# Patient Record
Sex: Male | Born: 1940
Health system: Southern US, Community
[De-identification: ages and names within clinical notes are randomized; demographics above are authoritative.]

## PROBLEM LIST (undated history)

## (undated) DIAGNOSIS — Z87442 Personal history of urinary calculi: Secondary | ICD-10-CM

## (undated) DIAGNOSIS — E785 Hyperlipidemia, unspecified: Secondary | ICD-10-CM

## (undated) DIAGNOSIS — D649 Anemia, unspecified: Secondary | ICD-10-CM

## (undated) DIAGNOSIS — I10 Essential (primary) hypertension: Secondary | ICD-10-CM

## (undated) DIAGNOSIS — E119 Type 2 diabetes mellitus without complications: Secondary | ICD-10-CM

## (undated) DIAGNOSIS — I4819 Other persistent atrial fibrillation: Secondary | ICD-10-CM

## (undated) DIAGNOSIS — H269 Unspecified cataract: Secondary | ICD-10-CM

## (undated) DIAGNOSIS — M1990 Unspecified osteoarthritis, unspecified site: Secondary | ICD-10-CM

## (undated) DIAGNOSIS — Z5189 Encounter for other specified aftercare: Secondary | ICD-10-CM

## (undated) DIAGNOSIS — I447 Left bundle-branch block, unspecified: Secondary | ICD-10-CM

## (undated) HISTORY — DX: Anemia, unspecified: D64.9

## (undated) HISTORY — DX: Hyperlipidemia, unspecified: E78.5

## (undated) HISTORY — DX: Unspecified cataract: H26.9

## (undated) HISTORY — PX: LITHOTRIPSY: SUR834

## (undated) HISTORY — DX: Other persistent atrial fibrillation: I48.19

## (undated) HISTORY — PX: OTHER SURGICAL HISTORY: SHX169

## (undated) HISTORY — PX: COLONOSCOPY: SHX174

## (undated) HISTORY — PX: POLYPECTOMY: SHX149

## (undated) HISTORY — PX: CATARACT EXTRACTION, BILATERAL: SHX1313

## (undated) HISTORY — DX: Type 2 diabetes mellitus without complications: E11.9

## (undated) HISTORY — DX: Unspecified osteoarthritis, unspecified site: M19.90

## (undated) HISTORY — DX: Essential (primary) hypertension: I10

## (undated) HISTORY — PX: KIDNEY STONE SURGERY: SHX686

## (undated) HISTORY — PX: CARPAL TUNNEL RELEASE: SHX101

## (undated) HISTORY — DX: Encounter for other specified aftercare: Z51.89

## (undated) HISTORY — PX: THUMB AMPUTATION: SHX804

## (undated) HISTORY — DX: Left bundle-branch block, unspecified: I44.7

## (undated) HISTORY — DX: Personal history of urinary calculi: Z87.442

---

## 1993-11-17 ENCOUNTER — Encounter: Payer: Self-pay | Admitting: Gastroenterology

## 1995-03-15 ENCOUNTER — Encounter: Payer: Self-pay | Admitting: Gastroenterology

## 2000-10-19 ENCOUNTER — Ambulatory Visit (HOSPITAL_COMMUNITY): Admission: RE | Admit: 2000-10-19 | Discharge: 2000-10-19 | Payer: Self-pay | Admitting: Internal Medicine

## 2000-10-19 ENCOUNTER — Encounter: Payer: Self-pay | Admitting: Internal Medicine

## 2000-11-20 ENCOUNTER — Ambulatory Visit (HOSPITAL_COMMUNITY): Admission: RE | Admit: 2000-11-20 | Discharge: 2000-11-20 | Payer: Self-pay | Admitting: Neurosurgery

## 2001-03-13 ENCOUNTER — Ambulatory Visit (HOSPITAL_COMMUNITY): Admission: RE | Admit: 2001-03-13 | Discharge: 2001-03-13 | Payer: Self-pay | Admitting: Neurosurgery

## 2001-11-18 ENCOUNTER — Encounter: Payer: Self-pay | Admitting: Gastroenterology

## 2004-02-16 ENCOUNTER — Encounter: Payer: Self-pay | Admitting: Gastroenterology

## 2005-08-13 ENCOUNTER — Ambulatory Visit (HOSPITAL_COMMUNITY): Admission: RE | Admit: 2005-08-13 | Discharge: 2005-08-13 | Payer: Self-pay | Admitting: Urology

## 2008-05-22 ENCOUNTER — Observation Stay (HOSPITAL_COMMUNITY): Admission: EM | Admit: 2008-05-22 | Discharge: 2008-05-23 | Payer: Self-pay | Admitting: Emergency Medicine

## 2008-09-13 ENCOUNTER — Encounter: Payer: Self-pay | Admitting: Gastroenterology

## 2008-09-16 ENCOUNTER — Telehealth: Payer: Self-pay | Admitting: Gastroenterology

## 2008-10-14 DIAGNOSIS — Z860100 Personal history of colon polyps, unspecified: Secondary | ICD-10-CM | POA: Insufficient documentation

## 2008-10-18 ENCOUNTER — Ambulatory Visit: Payer: Self-pay | Admitting: Gastroenterology

## 2008-10-18 DIAGNOSIS — D509 Iron deficiency anemia, unspecified: Secondary | ICD-10-CM | POA: Insufficient documentation

## 2008-10-18 DIAGNOSIS — Z853 Personal history of malignant neoplasm of breast: Secondary | ICD-10-CM | POA: Insufficient documentation

## 2008-10-18 LAB — CONVERTED CEMR LAB: IgA: 243 mg/dL (ref 68–378)

## 2008-10-20 LAB — CONVERTED CEMR LAB: Tissue Transglutaminase Ab, IgA: 0.4 units (ref <7)

## 2008-11-30 ENCOUNTER — Ambulatory Visit: Payer: Self-pay | Admitting: Gastroenterology

## 2008-12-07 ENCOUNTER — Ambulatory Visit: Payer: Self-pay | Admitting: Gastroenterology

## 2008-12-07 ENCOUNTER — Encounter: Payer: Self-pay | Admitting: Gastroenterology

## 2008-12-08 ENCOUNTER — Encounter: Payer: Self-pay | Admitting: Gastroenterology

## 2011-02-01 NOTE — Procedures (Signed)
Summary: EGD   EGD  Procedure date:  12/07/2008  Findings:      Location: [facility]    ENDOSCOPY PROCEDURE REPORT  PATIENT:  Benjamin Fuller, Benjamin Fuller  MR#:  [id number] BIRTHDATE:   1941/09/16   GENDER:   male  ENDOSCOPIST:   Benjamin Lick. Russella Dar, MD, Benjamin Fuller ASSISTANT:    PROCEDURE DATE:  12/07/2008 PROCEDURE:  EGD with biopsy ASA CLASS:   Class II INDICATIONS: iron deficiency anemia   MEDICATIONS:   None TOPICAL ANESTHETIC:   none  DESCRIPTION OF PROCEDURE:   After the risks benefits and alternatives of the procedure were thoroughly explained, informed consent was obtained.  The LB GIF-H180 [id number] endoscope was introduced through the mouth and advanced to the second portion of the duodenum, without limitations.  The instrument was slowly withdrawn as the mucosa was fully examined. <<PROCEDUREIMAGES>>            <<OLD IMAGES>>  Mild gastritis was found in the total stomach. It was erythematous, patchy and granular (see image5, image6, image7, and image8).  The esophagus and gastroesophageal junction were completely normal in appearance. in the total esophagus.  The duodenal bulb was normal in appearance, as was the postbulbar duodenum. In the second portion of the duodenum with standard forceps, a biopsy was obtained and sent to pathology.   Retroflexed views revealed no abnormalities.    The scope was then withdrawn from the patient and the procedure completed.  COMPLICATIONS:   None  ENDOSCOPIC IMPRESSION:  1) Mild gastritis in the total stomach  2) Normal esophagus in the total esophagus  3) Normal duodenum in the second portion duodenum  4) Normal duodenum in the bulb of duodenum RECOMMENDATIONS:  1) await pathology results  REPEAT EXAM:   No   _______________________________ Benjamin Lick. Russella Dar, MD, Benjamin Fuller   CC: Benjamin Fuller, M.D.     REPORT OF SURGICAL PATHOLOGY   Case #: [id number] Patient Name: Benjamin Fuller, Benjamin Fuller. Office Chart Number:  N/A   MRN:  [id number] Pathologist: Benjamin Hollingshead. Delila Spence, MD DOB/Age  04-19-1941 (Age: 70)    Gender: M Date Taken:  12/07/2008 Date Received: 12/07/2008   FINAL DIAGNOSIS   ***MICROSCOPIC EXAMINATION AND DIAGNOSIS***   1.  DUODENUM:  BENIGN SMALL BOWEL MUCOSA.  NO ACTIVE INFLAMMATION OR VILLOUS ATROPHY IDENTIFIED.   2.  STOMACH, BIOPSY:   - BENIGN GASTRIC TYPE MUCOSA WITH INTESTINAL METAPLASIA, CHRONIC  GASTRITIS AND REDUCED PARIETAL CELLS CONSISTENT WITH ATROPHIC GASTRITIS.   - NO DYSPLASIA OR MALIGNANCY IDENTIFIED. - NO HELICOBACTER PYLORI ORGANISMS IDENTIFIED.    COMMENT 1.  There is small bowel mucosa with normal villous architecture and no objective increase in inflammation.  No villous atrophy, active inflammation or other significant changes identified.   2.  A Warthin-Starry stain is performed to determine the possibility of the presence of Helicobacter pylori. The Warthin-Starry stain is negative for organisms of Helicobacter pylori. The control(s) stained appropriately. (EAA:mj 12/08/08)   mj Date Reported:  12/08/2008     Benjamin Hollingshead. Delila Spence, MD *** Electronically Signed Out By EAA ***     December 08, 2008 MRN: [id number]    Benjamin Fuller [address] [place], [place]  [address]    Dear Benjamin Fuller,  I am pleased to inform you that the biopsies taken during your recent endoscopic examination did not show any evidence of cancer upon pathologic examination. The biopsies showed gastritis.   Continue with the treatment plan as outlined on the day of your  exam.  Please call us if you are having persistent problems or have questions about your condition that have not been fully answered at this time.  Sincerely,  Benjamin Dare MD [facility]  This letter has been electronically signed by your physician.    This report was created from the original endoscopy report, which was reviewed and signed by the above listed endoscopist.

## 2011-02-01 NOTE — Assessment & Plan Note (Signed)
Summary: ANEMIA.Marland KitchenEM   History of Present Illness Visit Type: consult Primary GI MD: Elie Goody MD [facility] Primary Ambika Zettlemoyer: Lanell Persons, MD Chief Complaint: anemia History of Present Illness:   Benjamin Fuller is a 70 year old white male who I have evaluated in the past. He has a history of adenomatous colon polyps and diverticulosis. He has had ongoing problems with iron deficiency anemia for several years. He was recently found to be iron deficient again with a ferritin of 14.7. His hemoglobin was normal at 14.1. He was evaluated by Dr. Pierce Crane in 1992.   GI Review of Systems    Reports bloating.      Denies abdominal pain, acid reflux, belching, chest pain, dysphagia with liquids, dysphagia with solids, heartburn, loss of appetite, nausea, vomiting, vomiting blood, weight loss, and  weight gain.      Reports diverticulosis.     Denies anal fissure, black tarry stools, change in bowel habit, constipation, diarrhea, fecal incontinence, heme positive stool, hemorrhoids, irritable bowel syndrome, jaundice, light color stool, liver problems, rectal bleeding, and  rectal pain.    Prior Medications Reviewed Using: List Brought by Patient  Updated Prior Medication List: METFORMIN HCL 850 MG TABS (METFORMIN HCL) 1 tablet two times a day SIMVASTATIN 40 MG TABS (SIMVASTATIN) 1 tablet at bedtime RAMIPRIL 10 MG CAPS (RAMIPRIL) 1 tablet once daily FERROUS GLUCONATE 324 MG TABS (FERROUS GLUCONATE) 1 tablet once daily ASPIRIN ADULT LOW STRENGTH 81 MG TBEC (ASPIRIN) 1 tablet once daily MULTIVITAMINS   TABS (MULTIPLE VITAMIN) 1 tablet once daily TYLENOL PM EXTRA STRENGTH 500-25 MG TABS (DIPHENHYDRAMINE-APAP (SLEEP)) 1 tablet at bedtime  Current Allergies: ! PENICILLIN  Past Medical History:    Adenomatous Colon Polyps 1/993    Diverticulosis    Iron defiencey anemia    Diabetes    Hypertension    Kidney Stones  Past Surgical History:    Reviewed history from 10/14/2008 and no  changes required:       Carpal Tunnel Release  Family History:    Reviewed history from 10/14/2008 and no changes required:       Family History of Colon Cancer: Father age 51       Family History of Colon Polyps:Father       Family History of Diabetes: Father       Family History of Kidney Disease:  Social History:    Reviewed history and no changes required:       Patient is a former smoker. -stopped 15 years ago       Alcohol Use - no       Daily Caffeine Use       Illicit Drug Use - no       Patient gets regular exercise.  Risk Factors:  Tobacco use:  quit Drug use:  no Alcohol use:  no Exercise:  yes  Review of Systems       The patient complains of urine leakage.         The pertinent positives and negatives are noted as above and in the HPI. All other ROS were negative.   Vital Signs:  Patient Profile:   70 Years Old Male Height:     64 inches Weight:      176.50 pounds BMI:     30.41 BSA:     1.86 Pulse rate:   76 / minute Pulse rhythm:   regular BP sitting:   128 / 68  (right arm)  Vitals Entered By: Teresita Madura  CMA (October 18, 2008 8:45 AM)                  Physical Exam  General:     Well developed, well nourished, no acute distress. Head:     Normocephalic and atraumatic. Eyes:     PERRLA, no icterus. Ears:     Normal auditory acuity. Mouth:     No deformity or lesions, dentition normal. Neck:     Supple; no masses or thyromegaly. Chest Wall:     Symmetrical,  no deformities . Lungs:     Clear throughout to auscultation. Heart:     Regular rate and rhythm; no murmurs, rubs,  or bruits. Abdomen:     Soft, nontender and nondistended. No masses, hepatosplenomegaly or hernias noted. Normal bowel sounds. Rectal:     deferred until time of colonoscopy.   Msk:     Symmetrical with no gross deformities. Normal posture. Pulses:     Normal pulses noted. Extremities:     No clubbing, cyanosis, edema or deformities  noted. Neurologic:     Alert and  oriented x4;  grossly normal neurologically. Skin:     Intact without significant lesions or rashes. Cervical Nodes:     No significant cervical adenopathy. Psych:     Alert and cooperative. Normal mood and affect.   Impression & Recommendations:  Problem # 1:  ANEMIA, IRON DEFICIENCY (ICD-280.9) Recurrent iron deficiency. Rule out celiac disease arteriovenous malformations and other gastrointestinal sources of occult blood loss. He may have an unspecified iron malabsorption disorder. The risks, benefits and alternatives to colonoscopy with possible biopsy and possible polypectomy were discussed with the patient and they consent to proceed. The procedure will be scheduled electively. The risks, benefits and alternatives to endoscopy with possible biopsy and possible dilation were discussed with the patient and they consent to proceed. The procedure will be scheduled electively. Orders: TLB-IgA (Immunoglobulin A) (82784-IGA) T-Tissue Transglutamase Ab IgA ([id number])   Problem # 2:  COLONIC POLYPS, ADENOMATOUS, HX OF (ICD-V12.72) Colonoscopy as a problem #1.   Orders: TLB-IgA (Immunoglobulin A) (82784-IGA) T-Tissue Transglutamase Ab IgA ([id number])   Problem # 3:  FAMILY HX COLON CANCER (ICD-V16.0) As in problem #2.  Patient Instructions: 1)  Get your labs drawn today in the basement. 2)  You have been scheduled for a endo/colon. 3)  Colonoscopy brochure given. 4)  Upper Endoscopy brochure given. 5)  Copy Sent To: Lanell Persons, MD    ]

## 2011-02-01 NOTE — Letter (Signed)
Summary: Patient Notice-Endo Biopsy Results  [facility]  [address] [place], [place] [address]   Phone: [phone]  Fax: [phone]        December 08, 2008 MRN: [id number]    Benjamin Fuller [address] [place], [place]  [address]    Dear Benjamin Fuller,  I am pleased to inform you that the biopsies taken during your recent endoscopic examination did not show any evidence of cancer upon pathologic examination. The biopsies showed gastritis.   Continue with the treatment plan as outlined on the day of your      exam.  Please call us if you are having persistent problems or have questions about your condition that have not been fully answered at this time.  Sincerely,  Meryl Dare MD [facility]  This letter has been electronically signed by your physician.

## 2011-02-01 NOTE — Procedures (Signed)
Summary: Colonoscopy   Colonoscopy  Procedure date:  12/07/2008  Findings:      Location:  [facility].    Procedures Next Due Date:    Colonoscopy: 11/2013  COLONOSCOPY PROCEDURE REPORT  PATIENT:  Benjamin, Fuller  MR#:  [id number] BIRTHDATE:   1941-10-02   GENDER:   male  ENDOSCOPIST:   Venita Lick. Russella Dar, MD, Clementeen Graham ASSISTANT:    PROCEDURE DATE:  12/07/2008 PROCEDURE:  Colonoscopy, diagnostic ASA CLASS:   Class II INDICATIONS: iron deficiency anemia, family history of colon cancer, history of polyps adenomatous polpys, 01/1992  MEDICATIONS:    Fentanyl 100 mcg IV, Versed 9 mg IV  DESCRIPTION OF PROCEDURE:   After the risks benefits and alternatives of the procedure were thoroughly explained, informed consent was obtained.  Digital rectal exam was performed and revealed no abnormalities.   The LB PCF-H180AL [id number] endoscope was introduced through the anus and advanced to the cecum, which was identified by both the appendix and ileocecal valve, limited by none.    The quality of the prep was excellent.  The instrument was then slowly withdrawn as the colon was fully examined. <<PROCEDUREIMAGES>>                <<OLD IMAGES>>  FINDINGS:  Mild diverticulosis was found in the sigmoid colon.  The examination was otherwise normal.   Retroflexed views in the rectum revealed no abnormalities.    The scope was then withdrawn from the patient and the procedure completed.  COMPLICATIONS:   None  ENDOSCOPIC IMPRESSION:  1) Mild diverticulosis in the sigmoid colon  2) Otherwise normal examination RECOMMENDATIONS:  1) upper endoscopy  REPEAT EXAM:   In 5 year(s) for Colonoscopy.   _______________________________ Venita Lick. Russella Dar, MD, [facility]  This report was created from the original endoscopy report, which was reviewed and signed by the above listed endoscopist.    CC: Lanell Persons, M.D.

## 2011-02-01 NOTE — Progress Notes (Signed)
Summary: Discuss mutual patient  Phone Note Other Incoming Call back at [phone]   Call placed by: DR [facility] Call placed to: DR [facility] Summary of Call: Requested we pull patient's chart and have Dr Russella Dar return call today. Would like to discuss mutual patient. Initial call taken by: Leanor Kail [facility],  September 16, 2008 8:50 AM  Follow-up for Phone Call        chart ordered. Darcey Nora RN  September 16, 2008 9:33 AM  chart placed in your office  Follow-up by: Darcey Nora RN,  September 16, 2008 9:57 AM  Additional Follow-up for Phone Call Additional follow up Details #1::        Returned call. Recurrent Fe deficiency. Will be scheduled for F/U with me. Dr Theresia Lo will FAX records. Additional Follow-up by: Meryl Dare MD Clementeen Graham,  September 16, 2008 11:20 AM

## 2011-02-01 NOTE — Procedures (Signed)
Summary: Colon   Colonoscopy  Procedure date:  02/16/2004  Findings:      Results: Diverticulosis.       Location:  [facility].    Procedures Next Due Date:    Colonoscopy: 02/2009  Patient Name: Benjamin Fuller, Benjamin Fuller MRN:  Procedure Procedures: Colonoscopy CPT: 04540.  Personnel: Endoscopist: Venita Lick. Russella Dar, MD, Clementeen Graham.  Exam Location: Exam performed in Outpatient Clinic. Outpatient  Patient Consent: Procedure, Alternatives, Risks and Benefits discussed, consent obtained, from patient. Consent was obtained by the RN.  Indications  Surveillance of: Adenomatous Polyp(s). Initial polypectomy was performed in 1993. in Jan.  Increased Risk Screening: For family history of colorectal neoplasia, in  parent age at onset: 24.  Comments: Father with colon ca. History  Current Medications: Patient is not currently taking Coumadin.  Pre-Exam Physical: Performed Feb 16, 2004. Entire physical exam was normal.  Exam Exam: Extent of exam reached: Cecum, extent intended: Cecum.  The cecum was identified by appendiceal orifice and IC valve. Colon retroflexion performed. ASA Classification: II. Tolerance: excellent.  Monitoring: Pulse and BP monitoring, Oximetry used. Supplemental O2 given.  Colon Prep Used Golytely for colon prep. Prep results: excellent.  Sedation Meds: Patient assessed and found to be appropriate for moderate (conscious) sedation. Fentanyl 50 mcg. given IV. Versed 5 mg. given IV.  Findings NORMAL EXAM: Cecum to Descending Colon.  - DIVERTICULOSIS: Sigmoid Colon. Not bleeding. ICD9: Diverticulosis: 562.10. Comments: mild.  NORMAL EXAM: Rectum.   Assessment  Diagnoses: 562.10: Diverticulosis.   Events  Unplanned Interventions: No intervention was required.  Unplanned Events: There were no complications. Plans Medication Plan: Continue current medications.  Patient Education: Patient given standard instructions for: Diverticulosis.    Disposition: After procedure patient sent to recovery. After recovery patient sent home.  Scheduling/Referral: Colonoscopy, to [facility] T. Russella Dar, MD, [facility], around Feb 15, 2009.    This report was created from the original endoscopy report, which was reviewed and signed by the above listed endoscopist.     cc: Dellis Anes. Idell Pickles, MD

## 2011-02-01 NOTE — Letter (Signed)
Summary: 1998-2002/[facility]  1998-2002/[facility]   Imported By: Lester  10/28/2008 10:34:02  _____________________________________________________________________  External Attachment:    Type:   Image     Comment:   External Document

## 2011-05-15 NOTE — Op Note (Signed)
NAME:  Benjamin Fuller, NGAI NO.:  [id number]   MEDICAL RECORD NO.:  [id number]          PATIENT TYPE:  OBV   LOCATION:  5021                         FACILITY:  [facility]   PHYSICIAN:  Johnette Abraham, MD    DATE OF BIRTH:  August 13, 1941   DATE OF PROCEDURE:  DATE OF DISCHARGE:  05/23/2008                               OPERATIVE REPORT   SURGEON:  Johnette Abraham, MD   ASSISTANT:  None.   PREOPERATIVE DIAGNOSES:  1. Complex laceration of the right thumb.  2. Distal phalanx fracture of the right thumb.   POSTOPERATIVE DIAGNOSES:  1. Complex laceration of the right thumb.  2. Distal phalanx fracture of the right thumb.   PROCEDURES:  Exploration of the wound; removal of the nail plate;  debridement of skin, subcutaneous tissue, and bone; local advancement  flap of the right thumb.   ANESTHESIA:  General.   FINDINGS:  Volar-radial tissue loss, comminuted distal phalanx fracture.   SPECIMENS:  None.   DISPOSITION OF SPECIMENS:  Not applicable.   ESTIMATED BLOOD LOSS:  Minimal.   COMPLICATIONS:  None.   DISPOSITION:  To PACU in stable condition.   INDICATIONS:  Benjamin Fuller is a 70 year old gentleman who cut his thumb  yesterday with a circular saw.  He was seen in the ER and scheduled for  surgery.  The risks, benefits and alternatives of surgery were discussed  with the patient and the patient's wife.  They accepted these risks and  agreed to proceed with surgery.  Consent was obtained.   PROCEDURE IN DETAIL:  The patient was taken to the operating room.  General anesthesia was administered.  The right upper extremity was  prepped and draped in the normal sterile fashion.  The arm was elevated.  A tourniquet was inflated to 250 mmHg.  The wound was explored.  The  nail plate was removed.  It was evident that there was a comminuted  distal phalanx fracture with several very small bone pieces that were  not amenable to pin fixation and therefore these were excised.   The  wound itself was then debrided; skin, subcutaneous tissue, and bone were  debrided back to healthy-appearing tissue.  Following, incisions on both  sides of the nail plate down to bone and on the volar aspect of the  distal phalanx were created, the soft tissue envelope was mobilized.  On  the ulnar side of the thumb, there was more tissue than the radial side.  A small portion of the germinal matrix of the nail that was attached to  a piece of the distal phalanx was removed creating a nice even nail bed.  This also allowed the advancement flap from the ulnar to the radial side  possible without undue tension.  The wound was then irrigated with  irrigation solution.  The deep tissues were closed with a couple of  interrupted 5-0 Vicryl sutures covering the bone nicely.  Several  additional 6-0 chromic sutures were used in an interrupted fashion so  the nail bed back to the flap of skin that was advanced over  the end of  the bone.  Several sutures of 5-0 nylon were used to secure the flap to  the opposite adjacent skin closing the defect nicely and giving a good  aesthetic result.  Afterwards, the tourniquet was released.  The tip of  the thumb returned to nice pink color.  The patient's nail was then  fashion and placed up underneath the eponychial fold.  Antibiotic  ointment and Adaptic were placed as well as a sterile dressing and a  thumb spica splint.  The patient tolerated the procedure well and was  taken to recovery room in stable condition.      Johnette Abraham, MD  Electronically Signed     HCC/MEDQ  D:  05/23/2008  T:  05/23/2008  Job:  [id number]

## 2011-05-18 NOTE — Op Note (Signed)
[place]  Patient:    GOMER, [place]                      MRN: [id number] Proc. Date: 11/20/00 Adm. Date:  [id number] Attending:  Tressie Stalker D                           Operative Report  PREOPERATIVE DIAGNOSIS:  Right carpal tunnel syndrome.  (Median neuropathy at the wrist).  POSTOPERATIVE DIAGNOSIS:  Right carpal tunnel syndrome. (Median neuropathy at the wrist).  OPERATION PERFORMED:  Right carpal tunnel release (right median nerver neurolysis at the wrist).  SURGEON:  Cristi Loron, M.D.  ASSISTANT:  None.  ANESTHESIA:  Local.  SPECIMENS:  None.  DRAINS:  None.  COMPLICATIONS:  None.  INDICATIONS FOR PROCEDURE:  The patient is a 70 year old white male who suffers from bilateral hand numbness, right greater than left.  He failed medical management and was worked up as an outpatient with NCT-EMGs which demonstrated right greater than left carpal tunnel syndrome.  The patients signs, symptoms and physical exam were consistent with right carpal tunnel syndrome.  He therefore weighed the risks, benefits and alternatives to surgery and decided to proceed with the operation.  DESCRIPTION OF PROCEDURE:  Brought to the operating room.  His right hand and arm were prepared with Betadine scrub and Betadine solution and sterile drapes were applied.  I then injected the area to be incised with lidocaine solution. I then used scalpel to make incision in the patients palmar crease, right palmar crease.  I used electrocautery for hemostasis.  I used scalpel to incise the superficial fascia.  Then inserted the Weitlaner retractor, exposed the transverse carpal ligament.  I divided it with the scalpel, carefully cutting deeper and deeper until I completely transected the transverse carpal ligament.  I identified the median nerve.  I then used the Sims retractors to retract both proximally and distally through the incision to  completely ligate the transverse carpal ligament remaining on the ulnar aspect of the median nerve.  I then inspected the median nerve both proximally and distally and noted it was well decompressed.  I then reapproximated the patients skin with a running 3-0 nylon suture.  The wound was coated with bacitracin ointment. Sterile dressing was applied.  The drapes were removed.  He was subsequently transported out of the operating room in stable condition.  Sponge, needle and instrument counts were correct at end of this case. DD:  11/20/00 TD:  11/20/00 Job: [id number]

## 2011-05-18 NOTE — Op Note (Signed)
[place]  Patient:    Benjamin Fuller, Benjamin Fuller                      MRN: [id number] Proc. Date: 03/13/01 Adm. Date:  [id number] Disc. Date: [id number] Attending:  Tressie Stalker D                           Operative Report  BRIEF HISTORY:  The patient is a 70 year old white male, who I previously performed a right carpal tunnel release for carpal tunnel syndrome.  The patient did well from that surgery and therefore, decided to proceed with the left carpal tunnel release after failing medical management for left carpal tunnel syndrome.  PREOPERATIVE DIAGNOSES:  Left carpal tunnel syndrome.  POSTOPERATIVE DIAGNOSES:  Left carpal tunnel syndrome.  PROCEDURE:  Left medial nerve neurolysis at the wrist (left carpal tunnel release).  SURGEON:  Cristi Loron, M.D.  ASSISTANT:  None.  ANESTHESIA:  MAC.  SPECIMENS:  None.  COMPLICATIONS:  None.  DRAINS:  None.  DESCRIPTION OF PROCEDURE:  The patient was brought to the operating room.  His left hand and wrist was prepared with Betadine scrub and Betadine solution. Sterile drapes were applied.  I then injected the area to be incised with Marcaine solution.  I used a scalpel to make an incision through the patients palmar crease.  I used the 15-blade scalpel to dissect through the superficial fascia down through the transverse carpal ligament.  I divided the ligament, exposing the underlying median nerve.  I then divided the transverse carpal ligament both proximally and distally on the ulnar aspect of the median nerve. I got a complete transection of the ligament and good decompression of the median nerve.  I then hemostasis with bipolar electrocautery and then irrigated the wound out with saline solution.  I then reapproximated the patients skin with a running 3-0 nylon suture.  A sterile dressing was applied, the drapes were removed and the patient was subsequently discharged in stable  condition.  All sponge, instrument and needle counts were correct at the end of this operation. DD:  03/13/01 TD:  03/13/01 Job: [id number]
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
## 2011-09-26 LAB — BASIC METABOLIC PANEL
BUN: 19
CO2: 27
Calcium: 9.4
Chloride: 105
Creatinine, Ser: 0.77
GFR calc Af Amer: >60
GFR calc non Af Amer: >60
Glucose, Bld: 163 — ABNORMAL HIGH
Potassium: 4
Sodium: 138

## 2011-09-26 LAB — CBC
HCT: 41.7
Hemoglobin: 14.2
MCHC: 34
MCV: 88.9
Platelets: 175
RBC: 4.69
RDW: 13.3
WBC: 12.1 — ABNORMAL HIGH

## 2011-10-05 LAB — GLUCOSE, CAPILLARY
Glucose-Capillary: 157 mg/dL — ABNORMAL HIGH (ref 70–99)
Glucose-Capillary: 172 mg/dL — ABNORMAL HIGH (ref 70–99)

## 2012-02-14 DIAGNOSIS — E119 Type 2 diabetes mellitus without complications: Secondary | ICD-10-CM | POA: Diagnosis not present

## 2012-02-14 DIAGNOSIS — H26019 Infantile and juvenile cortical, lamellar, or zonular cataract, unspecified eye: Secondary | ICD-10-CM | POA: Diagnosis not present

## 2012-02-14 DIAGNOSIS — H2510 Age-related nuclear cataract, unspecified eye: Secondary | ICD-10-CM | POA: Diagnosis not present

## 2012-03-24 DIAGNOSIS — E119 Type 2 diabetes mellitus without complications: Secondary | ICD-10-CM | POA: Diagnosis not present

## 2012-03-24 DIAGNOSIS — Z8371 Family history of colonic polyps: Secondary | ICD-10-CM | POA: Diagnosis not present

## 2012-03-24 DIAGNOSIS — D509 Iron deficiency anemia, unspecified: Secondary | ICD-10-CM | POA: Diagnosis not present

## 2012-03-24 DIAGNOSIS — Z125 Encounter for screening for malignant neoplasm of prostate: Secondary | ICD-10-CM | POA: Diagnosis not present

## 2012-03-24 DIAGNOSIS — E7800 Pure hypercholesterolemia, unspecified: Secondary | ICD-10-CM | POA: Diagnosis not present

## 2012-03-24 DIAGNOSIS — I10 Essential (primary) hypertension: Secondary | ICD-10-CM | POA: Diagnosis not present

## 2012-03-24 DIAGNOSIS — Z0000 Encounter for general adult medical examination without abnormal findings: Secondary | ICD-10-CM | POA: Diagnosis not present

## 2012-03-24 DIAGNOSIS — N400 Enlarged prostate without lower urinary tract symptoms: Secondary | ICD-10-CM | POA: Diagnosis not present

## 2012-11-06 DIAGNOSIS — IMO0001 Reserved for inherently not codable concepts without codable children: Secondary | ICD-10-CM | POA: Diagnosis not present

## 2012-11-06 DIAGNOSIS — E119 Type 2 diabetes mellitus without complications: Secondary | ICD-10-CM | POA: Diagnosis not present

## 2012-11-06 DIAGNOSIS — D509 Iron deficiency anemia, unspecified: Secondary | ICD-10-CM | POA: Diagnosis not present

## 2012-11-06 DIAGNOSIS — D649 Anemia, unspecified: Secondary | ICD-10-CM | POA: Diagnosis not present

## 2012-11-06 DIAGNOSIS — Z1331 Encounter for screening for depression: Secondary | ICD-10-CM | POA: Diagnosis not present

## 2012-11-06 DIAGNOSIS — R197 Diarrhea, unspecified: Secondary | ICD-10-CM | POA: Diagnosis not present

## 2012-11-20 DIAGNOSIS — Z23 Encounter for immunization: Secondary | ICD-10-CM | POA: Diagnosis not present

## 2013-02-20 DIAGNOSIS — H26019 Infantile and juvenile cortical, lamellar, or zonular cataract, unspecified eye: Secondary | ICD-10-CM | POA: Diagnosis not present

## 2013-02-20 DIAGNOSIS — E119 Type 2 diabetes mellitus without complications: Secondary | ICD-10-CM | POA: Diagnosis not present

## 2013-02-20 DIAGNOSIS — H2510 Age-related nuclear cataract, unspecified eye: Secondary | ICD-10-CM | POA: Diagnosis not present

## 2013-04-02 DIAGNOSIS — D126 Benign neoplasm of colon, unspecified: Secondary | ICD-10-CM | POA: Diagnosis not present

## 2013-04-02 DIAGNOSIS — Z0000 Encounter for general adult medical examination without abnormal findings: Secondary | ICD-10-CM | POA: Diagnosis not present

## 2013-04-02 DIAGNOSIS — IMO0001 Reserved for inherently not codable concepts without codable children: Secondary | ICD-10-CM | POA: Diagnosis not present

## 2013-04-02 DIAGNOSIS — Z125 Encounter for screening for malignant neoplasm of prostate: Secondary | ICD-10-CM | POA: Diagnosis not present

## 2013-04-02 DIAGNOSIS — Z23 Encounter for immunization: Secondary | ICD-10-CM | POA: Diagnosis not present

## 2013-04-02 DIAGNOSIS — D509 Iron deficiency anemia, unspecified: Secondary | ICD-10-CM | POA: Diagnosis not present

## 2013-04-02 DIAGNOSIS — N400 Enlarged prostate without lower urinary tract symptoms: Secondary | ICD-10-CM | POA: Diagnosis not present

## 2013-04-02 DIAGNOSIS — E7800 Pure hypercholesterolemia, unspecified: Secondary | ICD-10-CM | POA: Diagnosis not present

## 2013-04-02 DIAGNOSIS — K7689 Other specified diseases of liver: Secondary | ICD-10-CM | POA: Diagnosis not present

## 2013-04-02 DIAGNOSIS — I10 Essential (primary) hypertension: Secondary | ICD-10-CM | POA: Diagnosis not present

## 2013-10-02 ENCOUNTER — Encounter: Payer: Self-pay | Admitting: Gastroenterology

## 2013-10-07 ENCOUNTER — Encounter: Payer: Self-pay | Admitting: Gastroenterology

## 2013-10-14 DIAGNOSIS — Z23 Encounter for immunization: Secondary | ICD-10-CM | POA: Diagnosis not present

## 2013-11-09 DIAGNOSIS — H6120 Impacted cerumen, unspecified ear: Secondary | ICD-10-CM | POA: Diagnosis not present

## 2013-11-12 DIAGNOSIS — H60399 Other infective otitis externa, unspecified ear: Secondary | ICD-10-CM | POA: Diagnosis not present

## 2013-11-20 DIAGNOSIS — E782 Mixed hyperlipidemia: Secondary | ICD-10-CM | POA: Diagnosis not present

## 2013-11-20 DIAGNOSIS — E119 Type 2 diabetes mellitus without complications: Secondary | ICD-10-CM | POA: Diagnosis not present

## 2013-11-20 DIAGNOSIS — H60399 Other infective otitis externa, unspecified ear: Secondary | ICD-10-CM | POA: Diagnosis not present

## 2013-11-20 DIAGNOSIS — I10 Essential (primary) hypertension: Secondary | ICD-10-CM | POA: Diagnosis not present

## 2013-11-20 DIAGNOSIS — H6120 Impacted cerumen, unspecified ear: Secondary | ICD-10-CM | POA: Diagnosis not present

## 2013-12-01 ENCOUNTER — Ambulatory Visit (AMBULATORY_SURGERY_CENTER): Payer: Self-pay

## 2013-12-01 VITALS — Ht 66.0 in

## 2013-12-01 DIAGNOSIS — Z800 Family history of malignant neoplasm of digestive organs: Secondary | ICD-10-CM

## 2013-12-01 DIAGNOSIS — Z86010 Personal history of colonic polyps: Secondary | ICD-10-CM

## 2013-12-01 MED ORDER — MOVIPREP 100 G PO SOLR: ORAL | Status: DC

## 2013-12-17 ENCOUNTER — Ambulatory Visit (AMBULATORY_SURGERY_CENTER): Payer: Medicare Other | Admitting: Gastroenterology

## 2013-12-17 ENCOUNTER — Encounter: Payer: Self-pay | Admitting: Gastroenterology

## 2013-12-17 ENCOUNTER — Other Ambulatory Visit: Payer: Self-pay | Admitting: *Deleted

## 2013-12-17 VITALS — BP 115/72 | HR 61 | Temp 98.2°F | Resp 17 | Ht 66.0 in | Wt 176.0 lb

## 2013-12-17 DIAGNOSIS — D126 Benign neoplasm of colon, unspecified: Secondary | ICD-10-CM

## 2013-12-17 DIAGNOSIS — Z860100 Personal history of colon polyps, unspecified: Secondary | ICD-10-CM

## 2013-12-17 DIAGNOSIS — Z800 Family history of malignant neoplasm of digestive organs: Secondary | ICD-10-CM | POA: Diagnosis not present

## 2013-12-17 DIAGNOSIS — I10 Essential (primary) hypertension: Secondary | ICD-10-CM | POA: Diagnosis not present

## 2013-12-17 DIAGNOSIS — R140 Abdominal distension (gaseous): Secondary | ICD-10-CM

## 2013-12-17 DIAGNOSIS — Z1211 Encounter for screening for malignant neoplasm of colon: Secondary | ICD-10-CM | POA: Diagnosis not present

## 2013-12-17 DIAGNOSIS — D649 Anemia, unspecified: Secondary | ICD-10-CM | POA: Diagnosis not present

## 2013-12-17 DIAGNOSIS — E119 Type 2 diabetes mellitus without complications: Secondary | ICD-10-CM | POA: Diagnosis not present

## 2013-12-17 LAB — GLUCOSE, CAPILLARY
Glucose-Capillary: 200 mg/dL — ABNORMAL HIGH (ref 70–99)
Glucose-Capillary: 146 mg/dL — ABNORMAL HIGH (ref 70–99)

## 2013-12-17 MED ORDER — SODIUM CHLORIDE 0.9 % IV SOLN: 500.0000 mL | INTRAVENOUS | Status: DC

## 2013-12-17 MED ORDER — HYOSCYAMINE SULFATE 0.125 MG SL SUBL: 0.1250 mg | SUBLINGUAL_TABLET | Freq: Three times a day (TID) | SUBLINGUAL | Status: DC

## 2013-12-17 NOTE — Progress Notes (Signed)
Report to pacu rn, vss, bbs=clear 

## 2013-12-17 NOTE — Progress Notes (Signed)
Called to room to assist during endoscopic procedure.  Patient ID and intended procedure confirmed with present staff. Received instructions for my participation in the procedure from the performing physician.  

## 2013-12-17 NOTE — Patient Instructions (Signed)

## 2013-12-17 NOTE — Op Note (Signed)
[facility] [address] [place], [address]   COLONOSCOPY PROCEDURE REPORT  PATIENT: Benjamin Fuller, Benjamin Fuller  MR#: [id number] BIRTHDATE: 10-Apr-1941 , 72  yrs. old GENDER: Male ENDOSCOPIST: Meryl Dare, MD, [facility] PROCEDURE DATE:  12/17/2013 PROCEDURE:   Colonoscopy with snare polypectomy First Screening Colonoscopy - Avg.  risk and is 50 yrs.  old or older - No.  Prior Negative Screening - Now for repeat screening. N/A  History of Adenoma - Now for follow-up colonoscopy & has been > or = to 3 yrs.  Yes hx of adenoma.  Has been 3 or more years since last colonoscopy.  Polyps Removed Today? Yes. ASA CLASS:   Class II INDICATIONS:Patient's personal history of adenomatous colon polyps and Patient's immediate family history of colon cancer. MEDICATIONS: MAC sedation, administered by CRNA and propofol (Diprivan) 250mg  IV DESCRIPTION OF PROCEDURE:   After the risks benefits and alternatives of the procedure were thoroughly explained, informed consent was obtained.  A digital rectal exam revealed no abnormalities of the rectum.   The [id number]  endoscope was introduced through the anus and advanced to the cecum, which was identified by both the appendix and ileocecal valve. No adverse events experienced.   The quality of the prep was good, using MoviPrep  The instrument was then slowly withdrawn as the colon was fully examined.  COLON FINDINGS: A sessile polyp measuring 1 cm in size was found at the hepatic flexure.  A polypectomy was performed using snare cautery.  The resection was complete and the polyp tissue was completely retrieved.   Moderate diverticulosis was noted in the sigmoid colon.   The colon was otherwise normal.  There was no diverticulosis, inflammation, polyps or cancers unless previously stated.  Retroflexed views revealed no abnormalities. The time to cecum=4 minutes 43 seconds.  Withdrawal time=12 minutes 25 seconds. The scope was  withdrawn and the procedure completed. COMPLICATIONS: There were no complications.  ENDOSCOPIC IMPRESSION: 1.   Sessile polyp measuring 1 cm at the hepatic flexure; polypectomy performed using snare cautery 2.   Moderate diverticulosis in the sigmoid colon  RECOMMENDATIONS: 1.  Hold aspirin, aspirin products, and anti-inflammatory medication for 2 weeks. 2.  Await pathology results 3.  Repeat Colonoscopy in 5 years.  eSigned:  Meryl Dare, MD, [facility] 12/17/2013 11:31 AM   cc: Catha Gosselin, MD

## 2013-12-17 NOTE — Addendum Note (Signed)
Addended by: Addison Naegeli D on: 12/17/2013 01:47 PM   Modules accepted: Orders

## 2013-12-17 NOTE — Addendum Note (Signed)
Addended by: Addison Naegeli D on: 12/17/2013 01:38 PM   Modules accepted: Orders

## 2013-12-18 ENCOUNTER — Telehealth: Payer: Self-pay | Admitting: *Deleted

## 2013-12-18 NOTE — Telephone Encounter (Signed)
  Follow up Call-  Call back number 12/17/2013  Post procedure Call Back phone  # [phone]  Permission to leave phone message Yes     Patient questions:  Do you have a fever, pain , or abdominal swelling? no Pain Score  0 *  Have you tolerated food without any problems? yes  Have you been able to return to your normal activities? yes  Do you have any questions about your discharge instructions: Diet   no Medications  no Follow up visit  no  Do you have questions or concerns about your Care? no  Actions: * If pain score is 4 or above: No action needed, pain <4.

## 2013-12-25 ENCOUNTER — Encounter: Payer: Self-pay | Admitting: Gastroenterology

## 2014-02-04 DIAGNOSIS — S93609A Unspecified sprain of unspecified foot, initial encounter: Secondary | ICD-10-CM | POA: Diagnosis not present

## 2014-02-04 DIAGNOSIS — S92919A Unspecified fracture of unspecified toe(s), initial encounter for closed fracture: Secondary | ICD-10-CM | POA: Diagnosis not present

## 2014-02-05 DIAGNOSIS — S90129A Contusion of unspecified lesser toe(s) without damage to nail, initial encounter: Secondary | ICD-10-CM | POA: Diagnosis not present

## 2014-02-05 DIAGNOSIS — T148XXA Other injury of unspecified body region, initial encounter: Secondary | ICD-10-CM | POA: Diagnosis not present

## 2014-02-05 DIAGNOSIS — M79609 Pain in unspecified limb: Secondary | ICD-10-CM | POA: Diagnosis not present

## 2014-02-05 DIAGNOSIS — S92919A Unspecified fracture of unspecified toe(s), initial encounter for closed fracture: Secondary | ICD-10-CM | POA: Diagnosis not present

## 2014-02-09 DIAGNOSIS — T148XXA Other injury of unspecified body region, initial encounter: Secondary | ICD-10-CM | POA: Diagnosis not present

## 2014-02-09 DIAGNOSIS — S90129A Contusion of unspecified lesser toe(s) without damage to nail, initial encounter: Secondary | ICD-10-CM | POA: Diagnosis not present

## 2014-02-09 DIAGNOSIS — M79609 Pain in unspecified limb: Secondary | ICD-10-CM | POA: Diagnosis not present

## 2014-02-22 DIAGNOSIS — S058X9A Other injuries of unspecified eye and orbit, initial encounter: Secondary | ICD-10-CM | POA: Diagnosis not present

## 2014-02-22 DIAGNOSIS — S92919A Unspecified fracture of unspecified toe(s), initial encounter for closed fracture: Secondary | ICD-10-CM | POA: Diagnosis not present

## 2014-02-22 DIAGNOSIS — T148XXA Other injury of unspecified body region, initial encounter: Secondary | ICD-10-CM | POA: Diagnosis not present

## 2014-02-22 DIAGNOSIS — M79609 Pain in unspecified limb: Secondary | ICD-10-CM | POA: Diagnosis not present

## 2014-02-22 DIAGNOSIS — S90129A Contusion of unspecified lesser toe(s) without damage to nail, initial encounter: Secondary | ICD-10-CM | POA: Diagnosis not present

## 2014-02-23 DIAGNOSIS — S0500XA Injury of conjunctiva and corneal abrasion without foreign body, unspecified eye, initial encounter: Secondary | ICD-10-CM | POA: Diagnosis not present

## 2014-03-01 DIAGNOSIS — E119 Type 2 diabetes mellitus without complications: Secondary | ICD-10-CM | POA: Diagnosis not present

## 2014-03-01 DIAGNOSIS — H26019 Infantile and juvenile cortical, lamellar, or zonular cataract, unspecified eye: Secondary | ICD-10-CM | POA: Diagnosis not present

## 2014-03-01 DIAGNOSIS — H2510 Age-related nuclear cataract, unspecified eye: Secondary | ICD-10-CM | POA: Diagnosis not present

## 2014-03-17 DIAGNOSIS — H2510 Age-related nuclear cataract, unspecified eye: Secondary | ICD-10-CM | POA: Diagnosis not present

## 2014-03-17 DIAGNOSIS — H26019 Infantile and juvenile cortical, lamellar, or zonular cataract, unspecified eye: Secondary | ICD-10-CM | POA: Diagnosis not present

## 2014-03-24 DIAGNOSIS — H2510 Age-related nuclear cataract, unspecified eye: Secondary | ICD-10-CM | POA: Diagnosis not present

## 2014-03-24 DIAGNOSIS — H26019 Infantile and juvenile cortical, lamellar, or zonular cataract, unspecified eye: Secondary | ICD-10-CM | POA: Diagnosis not present

## 2014-04-07 DIAGNOSIS — Z23 Encounter for immunization: Secondary | ICD-10-CM | POA: Diagnosis not present

## 2014-04-07 DIAGNOSIS — K7689 Other specified diseases of liver: Secondary | ICD-10-CM | POA: Diagnosis not present

## 2014-04-07 DIAGNOSIS — Z1331 Encounter for screening for depression: Secondary | ICD-10-CM | POA: Diagnosis not present

## 2014-04-07 DIAGNOSIS — N200 Calculus of kidney: Secondary | ICD-10-CM | POA: Diagnosis not present

## 2014-04-07 DIAGNOSIS — I10 Essential (primary) hypertension: Secondary | ICD-10-CM | POA: Diagnosis not present

## 2014-04-07 DIAGNOSIS — Z125 Encounter for screening for malignant neoplasm of prostate: Secondary | ICD-10-CM | POA: Diagnosis not present

## 2014-04-07 DIAGNOSIS — E785 Hyperlipidemia, unspecified: Secondary | ICD-10-CM | POA: Diagnosis not present

## 2014-04-07 DIAGNOSIS — D509 Iron deficiency anemia, unspecified: Secondary | ICD-10-CM | POA: Diagnosis not present

## 2014-04-07 DIAGNOSIS — Z0000 Encounter for general adult medical examination without abnormal findings: Secondary | ICD-10-CM | POA: Diagnosis not present

## 2014-04-07 DIAGNOSIS — IMO0001 Reserved for inherently not codable concepts without codable children: Secondary | ICD-10-CM | POA: Diagnosis not present

## 2014-04-14 DIAGNOSIS — H26019 Infantile and juvenile cortical, lamellar, or zonular cataract, unspecified eye: Secondary | ICD-10-CM | POA: Diagnosis not present

## 2014-04-14 DIAGNOSIS — H2510 Age-related nuclear cataract, unspecified eye: Secondary | ICD-10-CM | POA: Diagnosis not present

## 2014-05-25 DIAGNOSIS — D235 Other benign neoplasm of skin of trunk: Secondary | ICD-10-CM | POA: Diagnosis not present

## 2014-05-25 DIAGNOSIS — L570 Actinic keratosis: Secondary | ICD-10-CM | POA: Diagnosis not present

## 2014-08-09 DIAGNOSIS — T6391XA Toxic effect of contact with unspecified venomous animal, accidental (unintentional), initial encounter: Secondary | ICD-10-CM | POA: Diagnosis not present

## 2014-09-20 DIAGNOSIS — Z961 Presence of intraocular lens: Secondary | ICD-10-CM | POA: Diagnosis not present

## 2014-12-29 DIAGNOSIS — Z23 Encounter for immunization: Secondary | ICD-10-CM | POA: Diagnosis not present

## 2015-04-21 DIAGNOSIS — I10 Essential (primary) hypertension: Secondary | ICD-10-CM | POA: Diagnosis not present

## 2015-04-21 DIAGNOSIS — K760 Fatty (change of) liver, not elsewhere classified: Secondary | ICD-10-CM | POA: Diagnosis not present

## 2015-04-21 DIAGNOSIS — D126 Benign neoplasm of colon, unspecified: Secondary | ICD-10-CM | POA: Diagnosis not present

## 2015-04-21 DIAGNOSIS — N400 Enlarged prostate without lower urinary tract symptoms: Secondary | ICD-10-CM | POA: Diagnosis not present

## 2015-04-21 DIAGNOSIS — Z0000 Encounter for general adult medical examination without abnormal findings: Secondary | ICD-10-CM | POA: Diagnosis not present

## 2015-04-21 DIAGNOSIS — D649 Anemia, unspecified: Secondary | ICD-10-CM | POA: Diagnosis not present

## 2015-04-21 DIAGNOSIS — E119 Type 2 diabetes mellitus without complications: Secondary | ICD-10-CM | POA: Diagnosis not present

## 2015-04-21 DIAGNOSIS — E782 Mixed hyperlipidemia: Secondary | ICD-10-CM | POA: Diagnosis not present

## 2015-04-21 DIAGNOSIS — L570 Actinic keratosis: Secondary | ICD-10-CM | POA: Diagnosis not present

## 2015-04-21 DIAGNOSIS — Z125 Encounter for screening for malignant neoplasm of prostate: Secondary | ICD-10-CM | POA: Diagnosis not present

## 2015-07-14 ENCOUNTER — Encounter: Payer: Self-pay | Admitting: Gastroenterology

## 2015-08-17 ENCOUNTER — Ambulatory Visit
Admission: RE | Admit: 2015-08-17 | Discharge: 2015-08-17 | Disposition: A | Discharge status: Home / Self Care | Payer: Medicare Other | Source: Ambulatory Visit | Attending: Family Medicine | Admitting: Family Medicine

## 2015-08-17 ENCOUNTER — Other Ambulatory Visit: Payer: Self-pay | Admitting: Family Medicine

## 2015-08-17 DIAGNOSIS — R109 Unspecified abdominal pain: Secondary | ICD-10-CM

## 2015-08-17 DIAGNOSIS — N281 Cyst of kidney, acquired: Secondary | ICD-10-CM | POA: Diagnosis not present

## 2015-08-17 DIAGNOSIS — K8020 Calculus of gallbladder without cholecystitis without obstruction: Secondary | ICD-10-CM | POA: Diagnosis not present

## 2015-08-17 DIAGNOSIS — R310 Gross hematuria: Secondary | ICD-10-CM | POA: Diagnosis not present

## 2015-08-17 DIAGNOSIS — K4090 Unilateral inguinal hernia, without obstruction or gangrene, not specified as recurrent: Secondary | ICD-10-CM | POA: Diagnosis not present

## 2015-08-17 DIAGNOSIS — Z87442 Personal history of urinary calculi: Secondary | ICD-10-CM | POA: Diagnosis not present

## 2015-08-17 DIAGNOSIS — R300 Dysuria: Secondary | ICD-10-CM | POA: Diagnosis not present

## 2015-10-25 DIAGNOSIS — E119 Type 2 diabetes mellitus without complications: Secondary | ICD-10-CM | POA: Diagnosis not present

## 2015-11-07 DIAGNOSIS — E119 Type 2 diabetes mellitus without complications: Secondary | ICD-10-CM | POA: Diagnosis not present

## 2015-11-07 DIAGNOSIS — Z961 Presence of intraocular lens: Secondary | ICD-10-CM | POA: Diagnosis not present

## 2016-05-02 DIAGNOSIS — R8290 Unspecified abnormal findings in urine: Secondary | ICD-10-CM | POA: Diagnosis not present

## 2016-05-02 DIAGNOSIS — Z125 Encounter for screening for malignant neoplasm of prostate: Secondary | ICD-10-CM | POA: Diagnosis not present

## 2016-05-02 DIAGNOSIS — Z0000 Encounter for general adult medical examination without abnormal findings: Secondary | ICD-10-CM | POA: Diagnosis not present

## 2016-05-02 DIAGNOSIS — D126 Benign neoplasm of colon, unspecified: Secondary | ICD-10-CM | POA: Diagnosis not present

## 2016-05-02 DIAGNOSIS — N400 Enlarged prostate without lower urinary tract symptoms: Secondary | ICD-10-CM | POA: Diagnosis not present

## 2016-05-02 DIAGNOSIS — E782 Mixed hyperlipidemia: Secondary | ICD-10-CM | POA: Diagnosis not present

## 2016-05-02 DIAGNOSIS — L570 Actinic keratosis: Secondary | ICD-10-CM | POA: Diagnosis not present

## 2016-05-02 DIAGNOSIS — E119 Type 2 diabetes mellitus without complications: Secondary | ICD-10-CM | POA: Diagnosis not present

## 2016-05-02 DIAGNOSIS — I10 Essential (primary) hypertension: Secondary | ICD-10-CM | POA: Diagnosis not present

## 2016-05-02 DIAGNOSIS — Z7984 Long term (current) use of oral hypoglycemic drugs: Secondary | ICD-10-CM | POA: Diagnosis not present

## 2016-05-02 DIAGNOSIS — K760 Fatty (change of) liver, not elsewhere classified: Secondary | ICD-10-CM | POA: Diagnosis not present

## 2016-07-06 DIAGNOSIS — E119 Type 2 diabetes mellitus without complications: Secondary | ICD-10-CM | POA: Diagnosis not present

## 2016-07-06 DIAGNOSIS — Z7984 Long term (current) use of oral hypoglycemic drugs: Secondary | ICD-10-CM | POA: Diagnosis not present

## 2016-07-06 DIAGNOSIS — B349 Viral infection, unspecified: Secondary | ICD-10-CM | POA: Diagnosis not present

## 2016-07-09 DIAGNOSIS — R195 Other fecal abnormalities: Secondary | ICD-10-CM | POA: Diagnosis not present

## 2016-07-09 DIAGNOSIS — R197 Diarrhea, unspecified: Secondary | ICD-10-CM | POA: Diagnosis not present

## 2016-07-09 DIAGNOSIS — R319 Hematuria, unspecified: Secondary | ICD-10-CM | POA: Diagnosis not present

## 2016-07-18 DIAGNOSIS — R197 Diarrhea, unspecified: Secondary | ICD-10-CM | POA: Diagnosis not present

## 2016-08-02 DIAGNOSIS — B349 Viral infection, unspecified: Secondary | ICD-10-CM | POA: Diagnosis not present

## 2016-08-02 DIAGNOSIS — Z7984 Long term (current) use of oral hypoglycemic drugs: Secondary | ICD-10-CM | POA: Diagnosis not present

## 2016-08-02 DIAGNOSIS — R197 Diarrhea, unspecified: Secondary | ICD-10-CM | POA: Diagnosis not present

## 2016-08-02 DIAGNOSIS — E139 Other specified diabetes mellitus without complications: Secondary | ICD-10-CM | POA: Diagnosis not present

## 2016-08-02 DIAGNOSIS — E119 Type 2 diabetes mellitus without complications: Secondary | ICD-10-CM | POA: Diagnosis not present

## 2016-08-02 DIAGNOSIS — R319 Hematuria, unspecified: Secondary | ICD-10-CM | POA: Diagnosis not present

## 2016-11-05 DIAGNOSIS — Z23 Encounter for immunization: Secondary | ICD-10-CM | POA: Diagnosis not present

## 2016-11-29 DIAGNOSIS — E119 Type 2 diabetes mellitus without complications: Secondary | ICD-10-CM | POA: Diagnosis not present

## 2017-03-07 DIAGNOSIS — Z7984 Long term (current) use of oral hypoglycemic drugs: Secondary | ICD-10-CM | POA: Diagnosis not present

## 2017-03-07 DIAGNOSIS — E119 Type 2 diabetes mellitus without complications: Secondary | ICD-10-CM | POA: Diagnosis not present

## 2017-06-14 DIAGNOSIS — J309 Allergic rhinitis, unspecified: Secondary | ICD-10-CM | POA: Diagnosis not present

## 2017-06-14 DIAGNOSIS — N400 Enlarged prostate without lower urinary tract symptoms: Secondary | ICD-10-CM | POA: Diagnosis not present

## 2017-06-14 DIAGNOSIS — Z125 Encounter for screening for malignant neoplasm of prostate: Secondary | ICD-10-CM | POA: Diagnosis not present

## 2017-06-14 DIAGNOSIS — Z0000 Encounter for general adult medical examination without abnormal findings: Secondary | ICD-10-CM | POA: Diagnosis not present

## 2017-06-14 DIAGNOSIS — E1165 Type 2 diabetes mellitus with hyperglycemia: Secondary | ICD-10-CM | POA: Diagnosis not present

## 2017-06-14 DIAGNOSIS — E782 Mixed hyperlipidemia: Secondary | ICD-10-CM | POA: Diagnosis not present

## 2017-06-14 DIAGNOSIS — I10 Essential (primary) hypertension: Secondary | ICD-10-CM | POA: Diagnosis not present

## 2017-06-14 DIAGNOSIS — L570 Actinic keratosis: Secondary | ICD-10-CM | POA: Diagnosis not present

## 2017-06-14 DIAGNOSIS — K760 Fatty (change of) liver, not elsewhere classified: Secondary | ICD-10-CM | POA: Diagnosis not present

## 2017-10-10 DIAGNOSIS — I10 Essential (primary) hypertension: Secondary | ICD-10-CM | POA: Diagnosis not present

## 2017-10-10 DIAGNOSIS — L570 Actinic keratosis: Secondary | ICD-10-CM | POA: Diagnosis not present

## 2017-10-10 DIAGNOSIS — Z125 Encounter for screening for malignant neoplasm of prostate: Secondary | ICD-10-CM | POA: Diagnosis not present

## 2017-10-10 DIAGNOSIS — Z0000 Encounter for general adult medical examination without abnormal findings: Secondary | ICD-10-CM | POA: Diagnosis not present

## 2017-10-10 DIAGNOSIS — J309 Allergic rhinitis, unspecified: Secondary | ICD-10-CM | POA: Diagnosis not present

## 2017-10-10 DIAGNOSIS — E782 Mixed hyperlipidemia: Secondary | ICD-10-CM | POA: Diagnosis not present

## 2017-10-10 DIAGNOSIS — Z7984 Long term (current) use of oral hypoglycemic drugs: Secondary | ICD-10-CM | POA: Diagnosis not present

## 2017-10-10 DIAGNOSIS — K760 Fatty (change of) liver, not elsewhere classified: Secondary | ICD-10-CM | POA: Diagnosis not present

## 2017-10-10 DIAGNOSIS — E119 Type 2 diabetes mellitus without complications: Secondary | ICD-10-CM | POA: Diagnosis not present

## 2017-10-10 DIAGNOSIS — N400 Enlarged prostate without lower urinary tract symptoms: Secondary | ICD-10-CM | POA: Diagnosis not present

## 2017-11-01 DIAGNOSIS — C44311 Basal cell carcinoma of skin of nose: Secondary | ICD-10-CM | POA: Diagnosis not present

## 2017-11-01 DIAGNOSIS — L570 Actinic keratosis: Secondary | ICD-10-CM | POA: Diagnosis not present

## 2017-11-01 DIAGNOSIS — X32XXXA Exposure to sunlight, initial encounter: Secondary | ICD-10-CM | POA: Diagnosis not present

## 2017-11-01 DIAGNOSIS — C44319 Basal cell carcinoma of skin of other parts of face: Secondary | ICD-10-CM | POA: Diagnosis not present

## 2017-11-29 DIAGNOSIS — C44319 Basal cell carcinoma of skin of other parts of face: Secondary | ICD-10-CM | POA: Diagnosis not present

## 2017-11-29 DIAGNOSIS — X32XXXD Exposure to sunlight, subsequent encounter: Secondary | ICD-10-CM | POA: Diagnosis not present

## 2017-11-29 DIAGNOSIS — L570 Actinic keratosis: Secondary | ICD-10-CM | POA: Diagnosis not present

## 2018-01-10 DIAGNOSIS — Z85828 Personal history of other malignant neoplasm of skin: Secondary | ICD-10-CM | POA: Diagnosis not present

## 2018-01-10 DIAGNOSIS — Z08 Encounter for follow-up examination after completed treatment for malignant neoplasm: Secondary | ICD-10-CM | POA: Diagnosis not present

## 2018-01-16 DIAGNOSIS — Z7984 Long term (current) use of oral hypoglycemic drugs: Secondary | ICD-10-CM | POA: Diagnosis not present

## 2018-01-16 DIAGNOSIS — H26492 Other secondary cataract, left eye: Secondary | ICD-10-CM | POA: Diagnosis not present

## 2018-01-16 DIAGNOSIS — E119 Type 2 diabetes mellitus without complications: Secondary | ICD-10-CM | POA: Diagnosis not present

## 2018-01-16 DIAGNOSIS — Z961 Presence of intraocular lens: Secondary | ICD-10-CM | POA: Diagnosis not present

## 2018-01-22 DIAGNOSIS — H26492 Other secondary cataract, left eye: Secondary | ICD-10-CM | POA: Diagnosis not present

## 2018-03-11 DIAGNOSIS — Z125 Encounter for screening for malignant neoplasm of prostate: Secondary | ICD-10-CM | POA: Diagnosis not present

## 2018-03-11 DIAGNOSIS — E782 Mixed hyperlipidemia: Secondary | ICD-10-CM | POA: Diagnosis not present

## 2018-03-11 DIAGNOSIS — Z0000 Encounter for general adult medical examination without abnormal findings: Secondary | ICD-10-CM | POA: Diagnosis not present

## 2018-03-11 DIAGNOSIS — L570 Actinic keratosis: Secondary | ICD-10-CM | POA: Diagnosis not present

## 2018-03-11 DIAGNOSIS — Z7984 Long term (current) use of oral hypoglycemic drugs: Secondary | ICD-10-CM | POA: Diagnosis not present

## 2018-03-11 DIAGNOSIS — E119 Type 2 diabetes mellitus without complications: Secondary | ICD-10-CM | POA: Diagnosis not present

## 2018-03-11 DIAGNOSIS — K760 Fatty (change of) liver, not elsewhere classified: Secondary | ICD-10-CM | POA: Diagnosis not present

## 2018-03-11 DIAGNOSIS — I10 Essential (primary) hypertension: Secondary | ICD-10-CM | POA: Diagnosis not present

## 2018-03-11 DIAGNOSIS — N400 Enlarged prostate without lower urinary tract symptoms: Secondary | ICD-10-CM | POA: Diagnosis not present

## 2018-03-11 DIAGNOSIS — J309 Allergic rhinitis, unspecified: Secondary | ICD-10-CM | POA: Diagnosis not present

## 2018-07-10 DIAGNOSIS — Z0000 Encounter for general adult medical examination without abnormal findings: Secondary | ICD-10-CM | POA: Diagnosis not present

## 2018-07-10 DIAGNOSIS — K4090 Unilateral inguinal hernia, without obstruction or gangrene, not specified as recurrent: Secondary | ICD-10-CM | POA: Diagnosis not present

## 2018-07-10 DIAGNOSIS — E782 Mixed hyperlipidemia: Secondary | ICD-10-CM | POA: Diagnosis not present

## 2018-07-10 DIAGNOSIS — Z7984 Long term (current) use of oral hypoglycemic drugs: Secondary | ICD-10-CM | POA: Diagnosis not present

## 2018-07-10 DIAGNOSIS — Z1389 Encounter for screening for other disorder: Secondary | ICD-10-CM | POA: Diagnosis not present

## 2018-07-10 DIAGNOSIS — N400 Enlarged prostate without lower urinary tract symptoms: Secondary | ICD-10-CM | POA: Diagnosis not present

## 2018-07-10 DIAGNOSIS — I10 Essential (primary) hypertension: Secondary | ICD-10-CM | POA: Diagnosis not present

## 2018-07-10 DIAGNOSIS — E1169 Type 2 diabetes mellitus with other specified complication: Secondary | ICD-10-CM | POA: Diagnosis not present

## 2018-07-10 DIAGNOSIS — L570 Actinic keratosis: Secondary | ICD-10-CM | POA: Diagnosis not present

## 2018-07-10 DIAGNOSIS — K760 Fatty (change of) liver, not elsewhere classified: Secondary | ICD-10-CM | POA: Diagnosis not present

## 2018-07-10 DIAGNOSIS — Z86010 Personal history of colonic polyps: Secondary | ICD-10-CM | POA: Diagnosis not present

## 2019-01-04 ENCOUNTER — Encounter: Payer: Self-pay | Admitting: Gastroenterology

## 2019-01-22 DIAGNOSIS — E119 Type 2 diabetes mellitus without complications: Secondary | ICD-10-CM | POA: Diagnosis not present

## 2019-01-22 DIAGNOSIS — Z7984 Long term (current) use of oral hypoglycemic drugs: Secondary | ICD-10-CM | POA: Diagnosis not present

## 2019-01-22 DIAGNOSIS — Z961 Presence of intraocular lens: Secondary | ICD-10-CM | POA: Diagnosis not present

## 2019-01-27 ENCOUNTER — Encounter: Payer: Self-pay | Admitting: Gastroenterology

## 2019-01-30 ENCOUNTER — Ambulatory Visit (AMBULATORY_SURGERY_CENTER): Payer: Self-pay | Admitting: *Deleted

## 2019-01-30 VITALS — Ht 66.0 in | Wt 171.0 lb

## 2019-01-30 DIAGNOSIS — Z86010 Personal history of colonic polyps: Secondary | ICD-10-CM

## 2019-01-30 MED ORDER — NA SULFATE-K SULFATE-MG SULF 17.5-3.13-1.6 GM/177ML PO SOLN: 1.0000 | Freq: Once | ORAL | 0 refills | Status: AC

## 2019-01-30 NOTE — Progress Notes (Signed)
No egg or soy allergy known to patient  No issues with past sedation with any surgeries  or procedures, no intubation problems  No diet pills per patient No home 02 use per patient  No blood thinners per patient  Pt denies issues with constipation -  No A fib or A flutter  EMMI video sent to pt's e mail - pt declined  PNM $50 suprep coupon to pt- wife ion PV with pt today

## 2019-02-11 ENCOUNTER — Ambulatory Visit (AMBULATORY_SURGERY_CENTER): Payer: Medicare Other | Admitting: Gastroenterology

## 2019-02-11 ENCOUNTER — Encounter: Payer: Self-pay | Admitting: Gastroenterology

## 2019-02-11 VITALS — BP 107/77 | HR 66 | Temp 98.7°F | Resp 13 | Ht 66.0 in | Wt 171.0 lb

## 2019-02-11 DIAGNOSIS — E119 Type 2 diabetes mellitus without complications: Secondary | ICD-10-CM | POA: Diagnosis not present

## 2019-02-11 DIAGNOSIS — D123 Benign neoplasm of transverse colon: Secondary | ICD-10-CM | POA: Diagnosis not present

## 2019-02-11 DIAGNOSIS — I10 Essential (primary) hypertension: Secondary | ICD-10-CM | POA: Diagnosis not present

## 2019-02-11 DIAGNOSIS — D124 Benign neoplasm of descending colon: Secondary | ICD-10-CM | POA: Diagnosis not present

## 2019-02-11 DIAGNOSIS — D122 Benign neoplasm of ascending colon: Secondary | ICD-10-CM | POA: Diagnosis not present

## 2019-02-11 DIAGNOSIS — Z86010 Personal history of colonic polyps: Secondary | ICD-10-CM | POA: Diagnosis not present

## 2019-02-11 MED ORDER — SODIUM CHLORIDE 0.9 % IV SOLN: 500.0000 mL | Freq: Once | INTRAVENOUS | Status: DC

## 2019-02-11 NOTE — Progress Notes (Signed)
I have reviewed the patient's medical history in detail and updated the computerized patient record.

## 2019-02-11 NOTE — Patient Instructions (Signed)
Information on polyps, diverticulosis, high fiber diet and hemorrhoids given to you today.  Await pathology results.  No aspirin, naproxen, ibuprofen or other non steroidal anti inflammatory medications for two weeks after polyp removal.     YOU HAD AN ENDOSCOPIC PROCEDURE TODAY AT [facility]:   Refer to the procedure report that was given to you for any specific questions about what was found during the examination.  If the procedure report does not answer your questions, please call your gastroenterologist to clarify.  If you requested that your care partner not be given the details of your procedure findings, then the procedure report has been included in a sealed envelope for you to review at your convenience later.  YOU SHOULD EXPECT: Some feelings of bloating in the abdomen. Passage of more gas than usual.  Walking can help get rid of the air that was put into your GI tract during the procedure and reduce the bloating. If you had a lower endoscopy (such as a colonoscopy or flexible sigmoidoscopy) you may notice spotting of blood in your stool or on the toilet paper. If you underwent a bowel prep for your procedure, you may not have a normal bowel movement for a few days.  Please Note:  You might notice some irritation and congestion in your nose or some drainage.  This is from the oxygen used during your procedure.  There is no need for concern and it should clear up in a day or so.  SYMPTOMS TO REPORT IMMEDIATELY:   Following lower endoscopy (colonoscopy or flexible sigmoidoscopy):  Excessive amounts of blood in the stool  Significant tenderness or worsening of abdominal pains  Swelling of the abdomen that is new, acute  Fever of 100F or higher   For urgent or emergent issues, a gastroenterologist can be reached at any hour by calling [phone].   DIET:  We do recommend a small meal at first, but then you may proceed to your regular diet.  Drink plenty of  fluids but you should avoid alcoholic beverages for 24 hours.  ACTIVITY:  You should plan to take it easy for the rest of today and you should NOT DRIVE or use heavy machinery until tomorrow (because of the sedation medicines used during the test).    FOLLOW UP: Our staff will call the number listed on your records the next business day following your procedure to check on you and address any questions or concerns that you may have regarding the information given to you following your procedure. If we do not reach you, we will leave a message.  However, if you are feeling well and you are not experiencing any problems, there is no need to return our call.  We will assume that you have returned to your regular daily activities without incident.  If any biopsies were taken you will be contacted by phone or by letter within the next 1-3 weeks.  Please call us at [phone] if you have not heard about the biopsies in 3 weeks.    SIGNATURES/CONFIDENTIALITY: You and/or your care partner have signed paperwork which will be entered into your electronic medical record.  These signatures attest to the fact that that the information above on your After Visit Summary has been reviewed and is understood.  Full responsibility of the confidentiality of this discharge information lies with you and/or your care-partner.

## 2019-02-11 NOTE — Progress Notes (Signed)
Report given to PACU, vss 

## 2019-02-11 NOTE — Progress Notes (Signed)
Called to room to assist during endoscopic procedure.  Patient ID and intended procedure confirmed with present staff. Received instructions for my participation in the procedure from the performing physician.  

## 2019-02-11 NOTE — Op Note (Signed)
[facility] Patient Name: Benjamin Fuller Procedure Date: 02/11/2019 11:19 AM MRN: [id number] Endoscopist: Meryl Dare , MD Age: 78 Referring MD:  Date of Birth: Jul 07, 1941 Gender: Male Account #: [id number] Procedure:                Colonoscopy Indications:              Surveillance: Personal history of adenomatous                            polyps on last colonoscopy 5 years ago Medicines:                Monitored Anesthesia Care Procedure:                Pre-Anesthesia Assessment:                           - Prior to the procedure, a History and Physical                            was performed, and patient medications and                            allergies were reviewed. The patient's tolerance of                            previous anesthesia was also reviewed. The risks                            and benefits of the procedure and the sedation                            options and risks were discussed with the patient.                            All questions were answered, and informed consent                            was obtained. Prior Anticoagulants: The patient has                            taken no previous anticoagulant or antiplatelet                            agents. ASA Grade Assessment: II - A patient with                            mild systemic disease. After reviewing the risks                            and benefits, the patient was deemed in                            satisfactory condition to undergo the procedure.  After obtaining informed consent, the colonoscope                            was passed under direct vision. Throughout the                            procedure, the patient's blood pressure, pulse, and                            oxygen saturations were monitored continuously. The                            Colonoscope was introduced through the anus and                            advanced to the the cecum,  identified by                            appendiceal orifice and ileocecal valve. The                            ileocecal valve, appendiceal orifice, and rectum                            were photographed. The quality of the bowel                            preparation was good. The colonoscopy was performed                            without difficulty. The patient tolerated the                            procedure well. Scope In: 11:26:44 AM Scope Out: 11:47:20 AM Scope Withdrawal Time: 0 hours 16 minutes 36 seconds  Total Procedure Duration: 0 hours 20 minutes 36 seconds  Findings:                 The perianal and digital rectal examinations were                            normal.                           A 12 mm polyp was found in the transverse colon.                            The polyp was sessile. The polyp was removed with a                            hot snare. Resection and retrieval were complete.                           Four sessile polyps were found in the descending  colon (1), transverse colon (2) and ascending colon                            (1). The polyps were 6 to 8 mm in size. These                            polyps were removed with a cold snare. Resection                            and retrieval were complete.                           A 4 mm polyp was found in the transverse colon. The                            polyp was sessile. The polyp was removed with a                            cold biopsy forceps. Resection and retrieval were                            complete.                           Multiple medium-mouthed diverticula were found in                            the left colon. There was no evidence of                            diverticular bleeding.                           Internal hemorrhoids were found during                            retroflexion. The hemorrhoids were small and Grade                            I  (internal hemorrhoids that do not prolapse).                           The exam was otherwise without abnormality on                            direct and retroflexion views. Complications:            No immediate complications. Estimated blood loss:                            None. Estimated Blood Loss:     Estimated blood loss: none. Impression:               - One 12 mm polyp in the transverse colon, removed  with a hot snare. Resected and retrieved.                           - Four 6 to 8 mm polyps in the descending colon, in                            the transverse colon and in the ascending colon,                            removed with a cold snare. Resected and retrieved.                           - One 4 mm polyp in the transverse colon, removed                            with a cold biopsy forceps. Resected and retrieved.                           - Moderate diverticulosis in the left colon. There                            was no evidence of diverticular bleeding.                           - Internal hemorrhoids.                           - The examination was otherwise normal on direct                            and retroflexion views. Recommendation:           - Repeat colonoscopy in 2 - 3 years for                            surveillance pending pathology review.                           - Patient has a contact number available for                            emergencies. The signs and symptoms of potential                            delayed complications were discussed with the                            patient. Return to normal activities tomorrow.                            Written discharge instructions were provided to the                            patient.                           -  High fiber diet.                           - Continue present medications.                           - Await pathology results.                           - No  aspirin, ibuprofen, naproxen, or other                            non-steroidal anti-inflammatory drugs for 2 weeks                            after polyp removal. Meryl Dare, MD 02/11/2019 11:55:03 AM This report has been signed electronically.

## 2019-02-12 ENCOUNTER — Telehealth: Payer: Self-pay | Admitting: *Deleted

## 2019-02-12 NOTE — Telephone Encounter (Signed)
  Follow up Call-  Call back number 02/11/2019  Post procedure Call Back phone  # [phone]  Permission to leave phone message Yes  Some recent data might be hidden     Patient questions:  Do you have a fever, pain , or abdominal swelling? No. Pain Score  0 *  Have you tolerated food without any problems? Yes.    Have you been able to return to your normal activities? Yes.    Do you have any questions about your discharge instructions: Diet   No. Medications  No. Follow up visit  No.  Do you have questions or concerns about your Care? No.  Actions: * If pain score is 4 or above: No action needed, pain <4.

## 2019-03-05 ENCOUNTER — Telehealth: Payer: Self-pay | Admitting: Gastroenterology

## 2019-03-05 ENCOUNTER — Encounter: Payer: Self-pay | Admitting: Gastroenterology

## 2019-03-05 NOTE — Telephone Encounter (Signed)
Patient's wife notified.  She will have him call back if he has additional questions.

## 2019-03-05 NOTE — Telephone Encounter (Signed)
Dr. Fuller Plan, I do not see a letter.  Please advise on path

## 2019-03-05 NOTE — Telephone Encounter (Signed)
Pt called to inquire about path results from 2.12.20 colonoscopy.

## 2019-03-05 NOTE — Telephone Encounter (Signed)
Letter generated  Multiple adenomatous polyps Colonoscopy 01/2022

## 2019-03-06 DIAGNOSIS — E1169 Type 2 diabetes mellitus with other specified complication: Secondary | ICD-10-CM | POA: Diagnosis not present

## 2019-03-06 DIAGNOSIS — K4090 Unilateral inguinal hernia, without obstruction or gangrene, not specified as recurrent: Secondary | ICD-10-CM | POA: Diagnosis not present

## 2019-03-06 DIAGNOSIS — K760 Fatty (change of) liver, not elsewhere classified: Secondary | ICD-10-CM | POA: Diagnosis not present

## 2019-03-06 DIAGNOSIS — N400 Enlarged prostate without lower urinary tract symptoms: Secondary | ICD-10-CM | POA: Diagnosis not present

## 2019-03-06 DIAGNOSIS — Z86010 Personal history of colonic polyps: Secondary | ICD-10-CM | POA: Diagnosis not present

## 2019-03-06 DIAGNOSIS — I10 Essential (primary) hypertension: Secondary | ICD-10-CM | POA: Diagnosis not present

## 2019-03-06 DIAGNOSIS — R899 Unspecified abnormal finding in specimens from other organs, systems and tissues: Secondary | ICD-10-CM | POA: Diagnosis not present

## 2019-03-06 DIAGNOSIS — Z79899 Other long term (current) drug therapy: Secondary | ICD-10-CM | POA: Diagnosis not present

## 2019-03-06 DIAGNOSIS — E782 Mixed hyperlipidemia: Secondary | ICD-10-CM | POA: Diagnosis not present

## 2019-03-06 DIAGNOSIS — L570 Actinic keratosis: Secondary | ICD-10-CM | POA: Diagnosis not present

## 2019-07-28 DIAGNOSIS — Z1159 Encounter for screening for other viral diseases: Secondary | ICD-10-CM | POA: Diagnosis not present

## 2019-07-28 DIAGNOSIS — Z0000 Encounter for general adult medical examination without abnormal findings: Secondary | ICD-10-CM | POA: Diagnosis not present

## 2019-07-30 DIAGNOSIS — Z23 Encounter for immunization: Secondary | ICD-10-CM | POA: Diagnosis not present

## 2019-07-30 DIAGNOSIS — K760 Fatty (change of) liver, not elsewhere classified: Secondary | ICD-10-CM | POA: Diagnosis not present

## 2019-07-30 DIAGNOSIS — R197 Diarrhea, unspecified: Secondary | ICD-10-CM | POA: Diagnosis not present

## 2019-07-30 DIAGNOSIS — Z1159 Encounter for screening for other viral diseases: Secondary | ICD-10-CM | POA: Diagnosis not present

## 2019-07-30 DIAGNOSIS — E1169 Type 2 diabetes mellitus with other specified complication: Secondary | ICD-10-CM | POA: Diagnosis not present

## 2019-07-30 DIAGNOSIS — R202 Paresthesia of skin: Secondary | ICD-10-CM | POA: Diagnosis not present

## 2019-07-30 DIAGNOSIS — Z86010 Personal history of colonic polyps: Secondary | ICD-10-CM | POA: Diagnosis not present

## 2019-07-30 DIAGNOSIS — S81811A Laceration without foreign body, right lower leg, initial encounter: Secondary | ICD-10-CM | POA: Diagnosis not present

## 2019-07-30 DIAGNOSIS — I10 Essential (primary) hypertension: Secondary | ICD-10-CM | POA: Diagnosis not present

## 2019-07-30 DIAGNOSIS — E782 Mixed hyperlipidemia: Secondary | ICD-10-CM | POA: Diagnosis not present

## 2019-07-30 DIAGNOSIS — L570 Actinic keratosis: Secondary | ICD-10-CM | POA: Diagnosis not present

## 2019-07-30 DIAGNOSIS — E538 Deficiency of other specified B group vitamins: Secondary | ICD-10-CM | POA: Diagnosis not present

## 2019-09-01 ENCOUNTER — Other Ambulatory Visit: Payer: Self-pay | Admitting: Family Medicine

## 2019-09-01 DIAGNOSIS — T1590XA Foreign body on external eye, part unspecified, unspecified eye, initial encounter: Secondary | ICD-10-CM

## 2019-09-01 DIAGNOSIS — R202 Paresthesia of skin: Secondary | ICD-10-CM

## 2019-09-03 ENCOUNTER — Other Ambulatory Visit: Payer: Medicare Other

## 2019-09-03 ENCOUNTER — Ambulatory Visit
Admission: RE | Admit: 2019-09-03 | Discharge: 2019-09-03 | Disposition: A | Discharge status: Home / Self Care | Payer: Medicare Other | Source: Ambulatory Visit | Attending: Family Medicine | Admitting: Family Medicine

## 2019-09-03 ENCOUNTER — Other Ambulatory Visit: Payer: Self-pay

## 2019-09-03 DIAGNOSIS — M50223 Other cervical disc displacement at C6-C7 level: Secondary | ICD-10-CM | POA: Diagnosis not present

## 2019-09-03 DIAGNOSIS — M50222 Other cervical disc displacement at C5-C6 level: Secondary | ICD-10-CM | POA: Diagnosis not present

## 2019-09-03 DIAGNOSIS — R202 Paresthesia of skin: Secondary | ICD-10-CM

## 2019-09-03 DIAGNOSIS — T1590XA Foreign body on external eye, part unspecified, unspecified eye, initial encounter: Secondary | ICD-10-CM

## 2019-09-03 DIAGNOSIS — Z135 Encounter for screening for eye and ear disorders: Secondary | ICD-10-CM | POA: Diagnosis not present

## 2019-09-08 DIAGNOSIS — M542 Cervicalgia: Secondary | ICD-10-CM | POA: Diagnosis not present

## 2019-09-08 DIAGNOSIS — M4722 Other spondylosis with radiculopathy, cervical region: Secondary | ICD-10-CM | POA: Diagnosis not present

## 2019-10-30 DIAGNOSIS — Z1159 Encounter for screening for other viral diseases: Secondary | ICD-10-CM | POA: Diagnosis not present

## 2019-10-30 DIAGNOSIS — K760 Fatty (change of) liver, not elsewhere classified: Secondary | ICD-10-CM | POA: Diagnosis not present

## 2019-10-30 DIAGNOSIS — L570 Actinic keratosis: Secondary | ICD-10-CM | POA: Diagnosis not present

## 2019-10-30 DIAGNOSIS — Z7984 Long term (current) use of oral hypoglycemic drugs: Secondary | ICD-10-CM | POA: Diagnosis not present

## 2019-10-30 DIAGNOSIS — E538 Deficiency of other specified B group vitamins: Secondary | ICD-10-CM | POA: Diagnosis not present

## 2019-10-30 DIAGNOSIS — I10 Essential (primary) hypertension: Secondary | ICD-10-CM | POA: Diagnosis not present

## 2019-10-30 DIAGNOSIS — E1169 Type 2 diabetes mellitus with other specified complication: Secondary | ICD-10-CM | POA: Diagnosis not present

## 2019-10-30 DIAGNOSIS — Z23 Encounter for immunization: Secondary | ICD-10-CM | POA: Diagnosis not present

## 2019-10-30 DIAGNOSIS — R197 Diarrhea, unspecified: Secondary | ICD-10-CM | POA: Diagnosis not present

## 2019-10-30 DIAGNOSIS — S81811A Laceration without foreign body, right lower leg, initial encounter: Secondary | ICD-10-CM | POA: Diagnosis not present

## 2019-10-30 DIAGNOSIS — R202 Paresthesia of skin: Secondary | ICD-10-CM | POA: Diagnosis not present

## 2019-10-30 DIAGNOSIS — E782 Mixed hyperlipidemia: Secondary | ICD-10-CM | POA: Diagnosis not present

## 2019-11-06 DIAGNOSIS — Z1159 Encounter for screening for other viral diseases: Secondary | ICD-10-CM | POA: Diagnosis not present

## 2019-11-12 DIAGNOSIS — M4722 Other spondylosis with radiculopathy, cervical region: Secondary | ICD-10-CM | POA: Diagnosis not present

## 2019-11-12 DIAGNOSIS — M4802 Spinal stenosis, cervical region: Secondary | ICD-10-CM | POA: Diagnosis not present

## 2019-12-10 DIAGNOSIS — I10 Essential (primary) hypertension: Secondary | ICD-10-CM | POA: Diagnosis not present

## 2019-12-10 DIAGNOSIS — M4722 Other spondylosis with radiculopathy, cervical region: Secondary | ICD-10-CM | POA: Diagnosis not present

## 2020-01-05 DIAGNOSIS — R000 Tachycardia, unspecified: Secondary | ICD-10-CM | POA: Diagnosis not present

## 2020-01-05 DIAGNOSIS — I4891 Unspecified atrial fibrillation: Secondary | ICD-10-CM | POA: Diagnosis not present

## 2020-01-05 DIAGNOSIS — R040 Epistaxis: Secondary | ICD-10-CM | POA: Diagnosis not present

## 2020-01-06 ENCOUNTER — Ambulatory Visit (INDEPENDENT_AMBULATORY_CARE_PROVIDER_SITE_OTHER): Payer: Medicare Other | Admitting: Cardiology

## 2020-01-06 ENCOUNTER — Other Ambulatory Visit: Payer: Self-pay

## 2020-01-06 ENCOUNTER — Encounter: Payer: Self-pay | Admitting: Cardiology

## 2020-01-06 VITALS — BP 116/75 | HR 108 | Temp 97.0°F | Ht 66.0 in | Wt 167.4 lb

## 2020-01-06 DIAGNOSIS — Z7901 Long term (current) use of anticoagulants: Secondary | ICD-10-CM | POA: Diagnosis not present

## 2020-01-06 DIAGNOSIS — Z87898 Personal history of other specified conditions: Secondary | ICD-10-CM | POA: Diagnosis not present

## 2020-01-06 DIAGNOSIS — I10 Essential (primary) hypertension: Secondary | ICD-10-CM | POA: Diagnosis not present

## 2020-01-06 DIAGNOSIS — Z862 Personal history of diseases of the blood and blood-forming organs and certain disorders involving the immune mechanism: Secondary | ICD-10-CM

## 2020-01-06 DIAGNOSIS — Z7189 Other specified counseling: Secondary | ICD-10-CM | POA: Diagnosis not present

## 2020-01-06 DIAGNOSIS — Z79899 Other long term (current) drug therapy: Secondary | ICD-10-CM

## 2020-01-06 DIAGNOSIS — I4891 Unspecified atrial fibrillation: Secondary | ICD-10-CM | POA: Diagnosis not present

## 2020-01-06 DIAGNOSIS — E785 Hyperlipidemia, unspecified: Secondary | ICD-10-CM

## 2020-01-06 LAB — BASIC METABOLIC PANEL
BUN/Creatinine Ratio: 23 (ref 10–24)
BUN: 19 mg/dL (ref 8–27)
CO2: 20 mmol/L (ref 20–29)
Calcium: 9.6 mg/dL (ref 8.6–10.2)
Chloride: 100 mmol/L (ref 96–106)
Creatinine, Ser: 0.82 mg/dL (ref 0.76–1.27)
GFR calc Af Amer: 98 mL/min/{1.73_m2} (ref >59)
GFR calc non Af Amer: 85 mL/min/{1.73_m2} (ref >59)
Glucose: 312 mg/dL — ABNORMAL HIGH (ref 65–99)
Potassium: 4.4 mmol/L (ref 3.5–5.2)
Sodium: 136 mmol/L (ref 134–144)

## 2020-01-06 LAB — CBC
Hematocrit: 42.7 % (ref 37.5–51.0)
Hemoglobin: 15 g/dL (ref 13.0–17.7)
MCH: 31.3 pg (ref 26.6–33.0)
MCHC: 35.1 g/dL (ref 31.5–35.7)
MCV: 89 fL (ref 79–97)
Platelets: 183 10*3/uL (ref 150–450)
RBC: 4.79 x10E6/uL (ref 4.14–5.80)
RDW: 12.3 % (ref 11.6–15.4)
WBC: 7.5 10*3/uL (ref 3.4–10.8)

## 2020-01-06 MED ORDER — APIXABAN 5 MG PO TABS: 5.0000 mg | ORAL_TABLET | Freq: Two times a day (BID) | ORAL | 3 refills | Status: DC

## 2020-01-06 MED ORDER — METOPROLOL SUCCINATE ER 25 MG PO TB24: 25.0000 mg | ORAL_TABLET | Freq: Every day | ORAL | 3 refills | Status: DC

## 2020-01-06 NOTE — Progress Notes (Signed)
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 intended to replace advice given to you by your health care provider. Make sure you discuss any questions you have with your health care provider. Document Revised: 04/13/2019 Document Reviewed: 04/03/2017 Elsevier Patient Education  2020 [employer].    Signed, Benjamin Red, MD PhD 01/06/2020  [facility]

## 2020-01-06 NOTE — Patient Instructions (Addendum)
Medication Instructions:  Stop: Aspirin 81 mg daily Start: Eliquis 5 mg two times a day           Metoprolol 25 mg daily  If you have a nosebleed, would use Afrin nasal spray for up to 3 days in a row. Do not use for more than three days to avoid rebound congestion.  *If you need a refill on your cardiac medications before your next appointment, please call your pharmacy*  Lab Work: Your physician recommends that you return for lab work today ( CBC, BMP)  If you have labs (blood work) drawn today and your tests are completely normal, you will receive your results only by: Marland Kitchen MyChart Message (if you have MyChart) OR . A paper copy in the mail If you have any lab test that is abnormal or we need to change your treatment, we will call you to review the results.  Testing/Procedures: Your physician has requested that you have an echocardiogram. Echocardiography is a painless test that uses sound waves to create images of your heart. It provides your doctor with information about the size and shape of your heart and how well your heart's chambers and valves are working. This procedure takes approximately one hour. There are no restrictions for this procedure. [place] [address]   Follow-Up: At [facility], you and your health needs are our priority.  As part of our continuing mission to provide you with exceptional heart care, we have created designated Provider Care Teams.  These Care Teams include your primary Cardiologist (physician) and Advanced Practice Providers (APPs -  Physician Assistants and Nurse Practitioners) who all work together to provide you with the care you need, when you need it.  Your next appointment:   3-4 week(s)  The format for your next appointment:   In Person  Provider:   Buford Dresser, MD    Atrial Fibrillation  Atrial fibrillation is a type of irregular or rapid heartbeat (arrhythmia). In atrial fibrillation, the top part of the  heart (atria) beats in an irregular pattern. This makes the heart unable to pump blood normally and effectively. The goal of treatment is to prevent blood clots from forming, control your heart rate, or restore your heartbeat to a normal rhythm. If this condition is not treated, it can cause serious problems, such as a weakened heart muscle (cardiomyopathy) or a stroke. What are the causes? This condition is often caused by medical conditions that damage the heart's electrical system. These include:  High blood pressure (hypertension). This is the most common cause.  Certain heart problems or conditions, such as heart failure, coronary artery disease, heart valve problems, or heart surgery.  Diabetes.  Overactive thyroid (hyperthyroidism).  Obesity.  Chronic kidney disease. In some cases, the cause of this condition is not known. What increases the risk? This condition is more likely to develop in:  Older people.  People who smoke.  Athletes who do endurance exercise.  People who have a family history of atrial fibrillation.  Men.  People who use drugs.  People who drink a lot of alcohol.  People who have lung conditions, such as emphysema, pneumonia, or COPD.  People who have obstructive sleep apnea. What are the signs or symptoms? Symptoms of this condition include:  A feeling that your heart is racing or beating irregularly.  Discomfort or pain in your chest.  Shortness of breath.  Sudden light-headedness or weakness.  Tiring easily during exercise or activity.  Fatigue.  Syncope (fainting).  Sweating. In some cases, there are no symptoms. How is this diagnosed? Your health care provider may detect atrial fibrillation when taking your pulse. If detected, this condition may be diagnosed with:  An electrocardiogram (ECG) to check electrical signals of the heart.  An ambulatory cardiac monitor to record your heart's activity for a few days.  A  transthoracic echocardiogram (TTE) to create pictures of your heart.  A transesophageal echocardiogram (TEE) to create even closer pictures of your heart.  A stress test to check your blood supply while you exercise.  Imaging tests, such as a CT scan or chest X-ray.  Blood tests. How is this treated? Treatment depends on underlying conditions and how you feel when you experience atrial fibrillation. This condition may be treated with:  Medicines to prevent blood clots or to treat heart rate or heart rhythm problems.  Electrical cardioversion to reset the heart's rhythm.  A pacemaker to correct abnormal heart rhythm.  Ablation to remove the heart tissue that sends abnormal signals.  Left atrial appendage closure to seal the area where blood clots can form. In some cases, underlying conditions will be treated. Follow these instructions at home: Medicines  Take over-the counter and prescription medicines only as told by your health care provider.  Do not take any new medicines without talking to your health care provider.  If you are taking blood thinners: ? Talk with your health care provider before you take any medicines that contain aspirin or NSAIDs, such as ibuprofen. These medicines increase your risk for dangerous bleeding. ? Take your medicine exactly as told, at the same time every day. ? Avoid activities that could cause injury or bruising, and follow instructions about how to prevent falls. ? Wear a medical alert bracelet or carry a card that lists what medicines you take. Lifestyle      Do not use any products that contain nicotine or tobacco, such as cigarettes, e-cigarettes, and chewing tobacco. If you need help quitting, ask your health care provider.  Eat heart-healthy foods. Talk with a dietitian to make an eating plan that is right for you.  Exercise regularly as told by your health care provider.  Do not drink alcohol.  Lose weight if you are  overweight.  Do not use drugs, including cannabis. General instructions  If you have obstructive sleep apnea, manage your condition as told by your health care provider.  Do not use diet pills unless your health care provider approves. Diet pills can make heart problems worse.  Keep all follow-up visits as told by your health care provider. This is important. Contact a health care provider if you:  Notice a change in the rate, rhythm, or strength of your heartbeat.  Are taking a blood thinner and you notice more bruising.  Tire more easily when you exercise or do heavy work.  Have a sudden change in weight. Get help right away if you have:   Chest pain, abdominal pain, sweating, or weakness.  Trouble breathing.  Side effects of blood thinners, such as blood in your vomit, stool, or urine, or bleeding that cannot stop.  Any symptoms of a stroke. "BE FAST" is an easy way to remember the main warning signs of a stroke: ? B - Balance. Signs are dizziness, sudden trouble walking, or loss of balance. ? E - Eyes. Signs are trouble seeing or a sudden change in vision. ? F - Face. Signs are sudden weakness or numbness of the face, or  the face or eyelid drooping on one side. ? A - Arms. Signs are weakness or numbness in an arm. This happens suddenly and usually on one side of the body. ? S - Speech. Signs are sudden trouble speaking, slurred speech, or trouble understanding what people say. ? T - Time. Time to call emergency services. Write down what time symptoms started.  Other signs of a stroke, such as: ? A sudden, severe headache with no known cause. ? Nausea or vomiting. ? Seizure. These symptoms may represent a serious problem that is an emergency. Do not wait to see if the symptoms will go away. Get medical help right away. Call your local emergency services (911 in the [place] Do not drive yourself to the hospital. Summary  Atrial fibrillation is a type of irregular or rapid  heartbeat (arrhythmia).  Symptoms include a feeling that your heart is beating fast or irregularly.  You may be given medicines to prevent blood clots or to treat heart rate or heart rhythm problems.  Get help right away if you have signs or symptoms of a stroke.  Get help right away if you cannot catch your breath or have chest pain or pressure. This information is not intended to replace advice given to you by your health care provider. Make sure you discuss any questions you have with your health care provider. Document Revised: 06/10/2019 Document Reviewed: 06/10/2019 Elsevier Patient Education  [place].  Preventing Atrial Fibrillation-Related Stroke  Atrial fibrillation is a common type of irregular or rapid heartbeat (arrhythmia) that greatly increases your risk for a stroke. In atrial fibrillation, the top portions of the heart (atria) beat out of sync with the lower portions of the heart. When the muscles of the atria are tightening in an uncoordinated way (fibrillating), blood can pool in the heart and form clots. If a clot travels to the brain, it can cause a stroke. This type of stroke is preventable. Understanding atrial fibrillation and knowing how to properly manage it can prevent you from having a stroke. What increases my risk for a stroke? If you have atrial fibrillation, you may be at increased risk for a stroke if you also:  Have heart failure.  Have high blood pressure.  Are older than age 55.  Have diabetes.  Have a history of vascular disease, such as heart attack or stroke.  Are male. If you have atrial fibrillation and you also have one or more of those risk factors, talk with your health care provider about treatments that can prevent a stroke. Other risk factors for a stroke include:  Smoking.  High cholesterol.  Diabetes.  Being inactive (sedentary lifestyle).  Having a family history of stroke.  Eating a diet that is high in fat,  cholesterol, and salt. What treatments help to manage atrial fibrillation? The main goals of treatment for atrial fibrillation are to prevent blood clots from forming and to keep your heart beating at a normal rate and rhythm. Treatment may include:  Blood-thinning medicine (anticoagulant) that helps to prevent clots from forming. This medicine also increases the risk of bleeding. Talk with your health care provider about the risks and benefits of taking anticoagulants.  Medicine that slows the heart rate or brings the heart rhythm back to normal.  Electrical cardioversion. This is a procedure that resets the heart's rhythm by delivering a controlled, low-energy shock through your skin to your heart.  An ablation procedure, such as catheter ablation, catheter ablation with pacemaker, or  surgical ablation. These procedures destroy the heart tissues that send abnormal signals so that heart rhythms can be improved or made normal. A pacemaker is a device that is placed under the skin to help the heart beat in a regular rhythm. How can I prevent atrial fibrillation-related stroke? Medicines  Take over-the-counter and prescription medicines only as told by your health care provider.  If your health care provider prescribed an anticoagulant, take it exactly as told. Taking too much blood-thinning medicine can cause bleeding. If you do not take enough blood-thinning medicine, you will not have the protection that you need against stroke and other problems. Eating and drinking  Eat healthy foods, including at least 5 servings of fruits and vegetables a day.  Do not drink alcohol.  Do not drink beverages that contain caffeine, such as coffee, soda, and tea.  Follow dietary instructions as told by your health care provider. Managing other medical conditions  Manage and be aware of your blood pressure. If you have high blood pressure, follow your treatment plan to keep it in your target  range.  Have your cholesterol checked as often as recommended by your health care provider. If you have high cholesterol, follow your treatment plan to lower it and keep it in your target range.  Talk with your health care provider about symptoms to watch for. Some people may not have any symptoms, so it can be hard to know that they have atrial fibrillation. Talk with your health care provider if you experience: ? A feeling that your heart is beating rapidly or irregularly. ? An irregular pulse. ? A feeling of discomfort or pain in your chest. ? Shortness of breath. ? Sudden light-headedness or weakness. ? Tiredness (fatigue) that happens easily during exercise.  If you have obstructive sleep apnea (OSA), manage your condition as told by your health care provider. General instructions  Maintain a healthy weight. Do not use diet pills unless your health care provider approves. Diet pills may make heart problems worse.  Exercise regularly. Get at least 30 minutes of activity on most or all days, or as told by your health care provider.  Do not use any products that contain nicotine or tobacco, such as cigarettes and e-cigarettes. If you need help quitting, ask your health care provider.  Do not use drugs, such as cocaine and amphetamines.  Keep all follow-up visits as told by your health care providers. This is important. These include visits with your heart specialist. Where to find more information You may find more information about preventing atrial fibrillation-related stroke from:  National Stroke Association (AFib-Stroke Connection): www.stroke.org Contact a health care provider if:  You notice a change in the rate, rhythm, or strength of your heartbeat.  You have dizziness.  You are taking an anticoagulant and you have more bruises than usual.  You tire out more easily when you exercise or do similar activities. Get help right away if:   You have chest pain.  You have  pain in your abdomen.  You experience unusual sweating or weakness.  You take anticoagulants and you: ? Have severe headaches or confusion. ? Have blood in your vomit, bowel movement, or urine. ? Have bleeding that will not stop. ? Fall or injure your head.  You have any symptoms of a stroke. "BE FAST" is an easy way to remember the main warning signs of a stroke: ? B - Balance. Signs are dizziness, trouble walking, or loss of balance. ? E - Eyes.  Signs are trouble seeing or a sudden change in vision. ? F - Face. Signs are sudden weakness or numbness of the face, or the face or eyelid drooping on one side. ? A - Arms. Signs are weakness or numbness in an arm. This happens suddenly and usually on one side of the body. ? S - Speech. Signs are sudden trouble speaking, slurred speech, or trouble understanding what people say. ? T - Time. Time to call emergency services. Write down what time symptoms started.  You have other signs of a stroke, such as: ? A sudden, severe headache with no known cause. ? Nausea or vomiting. ? Seizure. These symptoms may represent a serious problem that is an emergency. Do not wait to see if the symptoms will go away. Get medical help right away. Call your local emergency services (911 in the [place] Do not drive yourself to the hospital. Summary  Having atrial fibrillation increases the risk for a stroke. Talk with your health care provider about what symptoms to watch for.  Atrial fibrillation-related stroke is preventable. Proper management of atrial fibrillation can prevent you from having a stroke.  Talk with your health care provider about whether anticoagulant medicine is right for you.  Learn the warning signs of a stroke and remember "BE FAST." This information is not intended to replace advice given to you by your health care provider. Make sure you discuss any questions you have with your health care provider. Document Revised: 04/13/2019 Document  Reviewed: 04/03/2017 Elsevier Patient Education  [place].

## 2020-01-07 ENCOUNTER — Telehealth: Payer: Self-pay | Admitting: Cardiology

## 2020-01-07 NOTE — Telephone Encounter (Addendum)
Patient made aware of results and verbalized understanding. He will follow up with his PCP.   His sugar is high on his test, but kidney function is normal. Blood counts are normal as well. No change to plan (ok to start anticoagulation), and he knows that his sugars have been running high. He should follow up with his PCP about this.

## 2020-01-07 NOTE — Telephone Encounter (Signed)
Patient returning call for lab results. 

## 2020-01-11 ENCOUNTER — Ambulatory Visit (INDEPENDENT_AMBULATORY_CARE_PROVIDER_SITE_OTHER): Payer: Medicare Other | Admitting: Otolaryngology

## 2020-01-14 ENCOUNTER — Other Ambulatory Visit: Payer: Self-pay

## 2020-01-14 ENCOUNTER — Ambulatory Visit (HOSPITAL_COMMUNITY): Payer: Medicare Other | Attending: Cardiology

## 2020-01-14 DIAGNOSIS — I4891 Unspecified atrial fibrillation: Secondary | ICD-10-CM | POA: Insufficient documentation

## 2020-01-15 ENCOUNTER — Ambulatory Visit (INDEPENDENT_AMBULATORY_CARE_PROVIDER_SITE_OTHER): Payer: Medicare Other | Admitting: Otolaryngology

## 2020-01-19 ENCOUNTER — Ambulatory Visit (INDEPENDENT_AMBULATORY_CARE_PROVIDER_SITE_OTHER): Payer: Medicare Other | Admitting: Otolaryngology

## 2020-01-19 ENCOUNTER — Other Ambulatory Visit: Payer: Self-pay

## 2020-01-19 VITALS — Temp 98.1°F

## 2020-01-19 DIAGNOSIS — R040 Epistaxis: Secondary | ICD-10-CM
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
## 2020-01-28 ENCOUNTER — Other Ambulatory Visit: Payer: Self-pay

## 2020-01-28 ENCOUNTER — Encounter: Payer: Self-pay | Admitting: Cardiology

## 2020-01-28 ENCOUNTER — Ambulatory Visit (INDEPENDENT_AMBULATORY_CARE_PROVIDER_SITE_OTHER): Payer: Medicare Other | Admitting: Cardiology

## 2020-01-28 VITALS — BP 124/78 | HR 73 | Temp 97.0°F | Wt 167.8 lb

## 2020-01-28 DIAGNOSIS — Z7984 Long term (current) use of oral hypoglycemic drugs: Secondary | ICD-10-CM | POA: Diagnosis not present

## 2020-01-28 DIAGNOSIS — Z961 Presence of intraocular lens: Secondary | ICD-10-CM | POA: Diagnosis not present

## 2020-01-28 DIAGNOSIS — Z7901 Long term (current) use of anticoagulants: Secondary | ICD-10-CM | POA: Insufficient documentation

## 2020-01-28 DIAGNOSIS — Z87898 Personal history of other specified conditions: Secondary | ICD-10-CM

## 2020-01-28 DIAGNOSIS — Z712 Person consulting for explanation of examination or test findings: Secondary | ICD-10-CM

## 2020-01-28 DIAGNOSIS — I4819 Other persistent atrial fibrillation: Secondary | ICD-10-CM

## 2020-01-28 DIAGNOSIS — E119 Type 2 diabetes mellitus without complications: Secondary | ICD-10-CM | POA: Diagnosis not present

## 2020-01-28 DIAGNOSIS — Z7189 Other specified counseling: Secondary | ICD-10-CM

## 2020-01-28 DIAGNOSIS — I10 Essential (primary) hypertension: Secondary | ICD-10-CM

## 2020-01-28 MED ORDER — APIXABAN 5 MG PO TABS: 5.0000 mg | ORAL_TABLET | Freq: Two times a day (BID) | ORAL | 3 refills | Status: DC

## 2020-01-28 MED ORDER — METOPROLOL SUCCINATE ER 25 MG PO TB24: 25.0000 mg | ORAL_TABLET | Freq: Every day | ORAL | 3 refills | Status: DC

## 2020-01-28 NOTE — Patient Instructions (Signed)
Medication Instructions:  Your Physician recommend you continue on your current medication as directed.    *If you need a refill on your cardiac medications before your next appointment, please call your pharmacy*  Lab Work: None  Testing/Procedures: None  Follow-Up: At [facility], you and your health needs are our priority.  As part of our continuing mission to provide you with exceptional heart care, we have created designated Provider Care Teams.  These Care Teams include your primary Cardiologist (physician) and Advanced Practice Providers (APPs -  Physician Assistants and Nurse Practitioners) who all work together to provide you with the care you need, when you need it.  Your next appointment:   3 months  The format for your next appointment:   In Person  Provider:   Bridgette Christopher, MD   
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
## 2020-01-29 ENCOUNTER — Ambulatory Visit: Payer: Medicare Other

## 2020-02-06 ENCOUNTER — Ambulatory Visit: Payer: Medicare Other | Attending: Internal Medicine

## 2020-02-06 DIAGNOSIS — Z23 Encounter for immunization: Secondary | ICD-10-CM

## 2020-02-06 NOTE — Progress Notes (Signed)
Covid-19 Vaccination Clinic  Name:  Benjamin Fuller    MRN: [id number] DOB: [birth year]  02/06/2020  [name] was observed post Covid-19 immunization for 15 minutes without incidence. He was provided with Vaccine Information Sheet and instruction to access the V-Safe system.   [name] was instructed to call 911 with any severe reactions post vaccine: Marland Kitchen Difficulty breathing  . Swelling of your face and throat  . A fast heartbeat  . A bad rash all over your body  . Dizziness and weakness    Immunizations Administered    Name Date Dose VIS Date Route   Pfizer COVID-19 Vaccine 02/06/2020  4:36 PM 0.3 mL 12/11/2019 Intramuscular   Manufacturer: [employer], [employer]   Lot: [id number]   NDC: [address]-2

## 2020-02-15 ENCOUNTER — Ambulatory Visit: Payer: Medicare Other

## 2020-02-19 DIAGNOSIS — E1169 Type 2 diabetes mellitus with other specified complication: Secondary | ICD-10-CM | POA: Diagnosis not present

## 2020-03-02 ENCOUNTER — Ambulatory Visit: Payer: Medicare Other | Attending: Internal Medicine

## 2020-03-02 DIAGNOSIS — Z23 Encounter for immunization: Secondary | ICD-10-CM | POA: Insufficient documentation

## 2020-03-02 NOTE — Progress Notes (Signed)
Covid-19 Vaccination Clinic  Name:  Benjamin Fuller    MRN: [id number] DOB: 1941-02-19  03/02/2020  [name] was observed post Covid-19 immunization for 15 minutes without incident. He was provided with Vaccine Information Sheet and instruction to access the V-Safe system.   [name] was instructed to call 911 with any severe reactions post vaccine: Marland Kitchen Difficulty breathing  . Swelling of face and throat  . A fast heartbeat  . A bad rash all over body  . Dizziness and weakness   Immunizations Administered    Name Date Dose VIS Date Route   Pfizer COVID-19 Vaccine 03/02/2020  1:48 PM 0.3 mL 12/11/2019 Intramuscular   Manufacturer: [employer], [employer]   Lot: [id number]   NDC: [address]-7

## 2020-03-29 DIAGNOSIS — M4722 Other spondylosis with radiculopathy, cervical region: Secondary | ICD-10-CM | POA: Diagnosis not present

## 2020-03-29 DIAGNOSIS — Z6827 Body mass index (BMI) 27.0-27.9, adult: Secondary | ICD-10-CM | POA: Diagnosis not present

## 2020-04-14 DIAGNOSIS — E119 Type 2 diabetes mellitus without complications: Secondary | ICD-10-CM | POA: Diagnosis not present

## 2020-04-14 DIAGNOSIS — I10 Essential (primary) hypertension: Secondary | ICD-10-CM | POA: Diagnosis not present

## 2020-04-14 DIAGNOSIS — E1169 Type 2 diabetes mellitus with other specified complication: Secondary | ICD-10-CM | POA: Diagnosis not present

## 2020-04-14 DIAGNOSIS — I4891 Unspecified atrial fibrillation: Secondary | ICD-10-CM | POA: Diagnosis not present

## 2020-04-14 DIAGNOSIS — E782 Mixed hyperlipidemia: Secondary | ICD-10-CM | POA: Diagnosis not present

## 2020-04-14 DIAGNOSIS — Z7984 Long term (current) use of oral hypoglycemic drugs: Secondary | ICD-10-CM | POA: Diagnosis not present

## 2020-04-14 DIAGNOSIS — N400 Enlarged prostate without lower urinary tract symptoms: Secondary | ICD-10-CM | POA: Diagnosis not present

## 2020-04-28 ENCOUNTER — Ambulatory Visit: Payer: Medicare Other | Admitting: Cardiology

## 2020-05-05 ENCOUNTER — Encounter: Payer: Self-pay | Admitting: Cardiology

## 2020-05-05 ENCOUNTER — Other Ambulatory Visit: Payer: Self-pay

## 2020-05-05 ENCOUNTER — Ambulatory Visit (INDEPENDENT_AMBULATORY_CARE_PROVIDER_SITE_OTHER): Payer: Medicare Other | Admitting: Cardiology

## 2020-05-05 VITALS — BP 116/62 | HR 80 | Ht 66.0 in | Wt 169.2 lb

## 2020-05-05 DIAGNOSIS — I10 Essential (primary) hypertension: Secondary | ICD-10-CM | POA: Diagnosis not present

## 2020-05-05 DIAGNOSIS — R5383 Other fatigue: Secondary | ICD-10-CM | POA: Diagnosis not present

## 2020-05-05 DIAGNOSIS — Z87898 Personal history of other specified conditions: Secondary | ICD-10-CM

## 2020-05-05 DIAGNOSIS — I4819 Other persistent atrial fibrillation: Secondary | ICD-10-CM | POA: Diagnosis not present

## 2020-05-05 DIAGNOSIS — Z7901 Long term (current) use of anticoagulants: Secondary | ICD-10-CM | POA: Diagnosis not present

## 2020-05-05 NOTE — Patient Instructions (Signed)
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
## 2020-05-09 ENCOUNTER — Telehealth: Payer: Self-pay | Admitting: Cardiology

## 2020-05-09 DIAGNOSIS — Z01812 Encounter for preprocedural laboratory examination: Secondary | ICD-10-CM

## 2020-05-09 DIAGNOSIS — I4819 Other persistent atrial fibrillation: Secondary | ICD-10-CM

## 2020-05-09 NOTE — Telephone Encounter (Signed)
Patient calling stating he was told to call if he decided to do shock therapy for his afib. He states he would like to schedule it.

## 2020-05-10 NOTE — Telephone Encounter (Signed)
Attempted to contact pt. Unable to leave message as phone is ringing busy.

## 2020-05-11 NOTE — Telephone Encounter (Signed)
Spoke with pt and scheduled cardioversion and COVID test. Instruction provided to patient and uploaded to mychart as well as mailed. Pt verbalized understanding.

## 2020-05-11 NOTE — Telephone Encounter (Signed)
Left message to call back  

## 2020-05-23 NOTE — Addendum Note (Signed)
Addended by: Zebedee Iba on: 05/23/2020 04:37 PM   Modules accepted: Orders

## 2020-05-24 ENCOUNTER — Other Ambulatory Visit (HOSPITAL_COMMUNITY)
Admission: RE | Admit: 2020-05-24 | Discharge: 2020-05-24 | Disposition: A | Discharge status: Home / Self Care | Payer: Medicare Other | Source: Ambulatory Visit | Attending: Cardiovascular Disease | Admitting: Cardiovascular Disease

## 2020-05-24 DIAGNOSIS — Z01812 Encounter for preprocedural laboratory examination: Secondary | ICD-10-CM | POA: Insufficient documentation

## 2020-05-24 DIAGNOSIS — I4819 Other persistent atrial fibrillation: Secondary | ICD-10-CM | POA: Diagnosis not present

## 2020-05-24 DIAGNOSIS — Z20822 Contact with and (suspected) exposure to covid-19: Secondary | ICD-10-CM | POA: Diagnosis not present

## 2020-05-24 LAB — CBC
Hematocrit: 43.5 % (ref 37.5–51.0)
Hemoglobin: 14.8 g/dL (ref 13.0–17.7)
MCH: 30.1 pg (ref 26.6–33.0)
MCHC: 34 g/dL (ref 31.5–35.7)
MCV: 88 fL (ref 79–97)
Platelets: 152 10*3/uL (ref 150–450)
RBC: 4.92 x10E6/uL (ref 4.14–5.80)
RDW: 12.4 % (ref 11.6–15.4)
WBC: 7.4 10*3/uL (ref 3.4–10.8)

## 2020-05-24 LAB — BASIC METABOLIC PANEL
BUN/Creatinine Ratio: 27 — ABNORMAL HIGH (ref 10–24)
BUN: 22 mg/dL (ref 8–27)
CO2: 19 mmol/L — ABNORMAL LOW (ref 20–29)
Calcium: 8.8 mg/dL (ref 8.6–10.2)
Chloride: 102 mmol/L (ref 96–106)
Creatinine, Ser: 0.82 mg/dL (ref 0.76–1.27)
GFR calc Af Amer: 98 mL/min/{1.73_m2} (ref >59)
GFR calc non Af Amer: 85 mL/min/{1.73_m2} (ref >59)
Glucose: 244 mg/dL — ABNORMAL HIGH (ref 65–99)
Potassium: 4.3 mmol/L (ref 3.5–5.2)
Sodium: 136 mmol/L (ref 134–144)

## 2020-05-24 LAB — SARS CORONAVIRUS 2 (TAT 6-24 HRS): SARS Coronavirus 2: NEGATIVE

## 2020-05-26 DIAGNOSIS — Z7984 Long term (current) use of oral hypoglycemic drugs: Secondary | ICD-10-CM | POA: Diagnosis not present

## 2020-05-26 DIAGNOSIS — E119 Type 2 diabetes mellitus without complications: Secondary | ICD-10-CM | POA: Diagnosis not present

## 2020-05-27 ENCOUNTER — Ambulatory Visit (HOSPITAL_COMMUNITY): Payer: Medicare Other | Admitting: Anesthesiology

## 2020-05-27 ENCOUNTER — Encounter (HOSPITAL_COMMUNITY)
Admission: RE | Disposition: A | Discharge status: Home / Self Care | Payer: Medicare Other | Source: Home / Self Care | Attending: Cardiovascular Disease

## 2020-05-27 ENCOUNTER — Other Ambulatory Visit: Payer: Self-pay

## 2020-05-27 ENCOUNTER — Encounter (HOSPITAL_COMMUNITY): Payer: Self-pay | Admitting: Cardiovascular Disease

## 2020-05-27 ENCOUNTER — Ambulatory Visit (HOSPITAL_COMMUNITY)
Admission: RE | Admit: 2020-05-27 | Discharge: 2020-05-27 | Disposition: A | Discharge status: Home / Self Care | Payer: Medicare Other | Attending: Cardiovascular Disease | Admitting: Cardiovascular Disease

## 2020-05-27 DIAGNOSIS — Z7901 Long term (current) use of anticoagulants: Secondary | ICD-10-CM | POA: Diagnosis not present

## 2020-05-27 DIAGNOSIS — E119 Type 2 diabetes mellitus without complications: Secondary | ICD-10-CM | POA: Insufficient documentation

## 2020-05-27 DIAGNOSIS — Z79899 Other long term (current) drug therapy: Secondary | ICD-10-CM | POA: Insufficient documentation

## 2020-05-27 DIAGNOSIS — R5383 Other fatigue: Secondary | ICD-10-CM | POA: Diagnosis not present

## 2020-05-27 DIAGNOSIS — E785 Hyperlipidemia, unspecified: Secondary | ICD-10-CM | POA: Diagnosis not present

## 2020-05-27 DIAGNOSIS — Z7984 Long term (current) use of oral hypoglycemic drugs: Secondary | ICD-10-CM | POA: Diagnosis not present

## 2020-05-27 DIAGNOSIS — D509 Iron deficiency anemia, unspecified: Secondary | ICD-10-CM | POA: Diagnosis not present

## 2020-05-27 DIAGNOSIS — I4891 Unspecified atrial fibrillation: Secondary | ICD-10-CM | POA: Diagnosis not present

## 2020-05-27 DIAGNOSIS — I10 Essential (primary) hypertension: Secondary | ICD-10-CM | POA: Diagnosis not present

## 2020-05-27 DIAGNOSIS — Z880 Allergy status to penicillin: Secondary | ICD-10-CM | POA: Insufficient documentation

## 2020-05-27 DIAGNOSIS — I4819 Other persistent atrial fibrillation: Secondary | ICD-10-CM | POA: Insufficient documentation

## 2020-05-27 DIAGNOSIS — Z87891 Personal history of nicotine dependence: Secondary | ICD-10-CM | POA: Diagnosis not present

## 2020-05-27 HISTORY — PX: CARDIOVERSION: SHX1299

## 2020-05-27 LAB — GLUCOSE, CAPILLARY: Glucose-Capillary: 150 mg/dL — ABNORMAL HIGH (ref 70–99)

## 2020-05-27 SURGERY — CARDIOVERSION
Anesthesia: General

## 2020-05-27 MED ORDER — SODIUM CHLORIDE 0.9 % IV SOLN: INTRAVENOUS | Status: DC

## 2020-05-27 MED ORDER — LIDOCAINE 2% (20 MG/ML) 5 ML SYRINGE
INTRAMUSCULAR | Status: DC | PRN
  Administered 2020-05-27: 60 mg via INTRAVENOUS

## 2020-05-27 MED ORDER — PROPOFOL 10 MG/ML IV BOLUS
INTRAVENOUS | Status: DC | PRN
  Administered 2020-05-27: 100 mg via INTRAVENOUS

## 2020-05-27 NOTE — Anesthesia Preprocedure Evaluation (Addendum)
Anesthesia Evaluation  Patient identified by MRN, date of birth, ID band Patient awake    Reviewed: Allergy & Precautions, NPO status , Patient's Chart, lab work & pertinent test results  Airway Mallampati: IV  TM Distance: >3 FB Neck ROM: Full    Dental no notable dental hx.    Pulmonary former smoker,    Pulmonary exam normal breath sounds clear to auscultation       Cardiovascular hypertension, Pt. on home beta blockers and Pt. on medications + dysrhythmias Atrial Fibrillation  Rhythm:Irregular Rate:Normal  ECG: LAD, rate 80. A-fib   Neuro/Psych negative neurological ROS  negative psych ROS   GI/Hepatic negative GI ROS, Neg liver ROS,   Endo/Other  diabetes, Oral Hypoglycemic Agents  Renal/GU negative Renal ROS     Musculoskeletal negative musculoskeletal ROS (+)   Abdominal   Peds  Hematology HLD   Anesthesia Other Findings A-FIB  Reproductive/Obstetrics                            Anesthesia Physical Anesthesia Plan  ASA: III  Anesthesia Plan: General   Post-op Pain Management:    Induction: Intravenous  PONV Risk Score and Plan: 2 and Propofol infusion and Treatment may vary due to age or medical condition  Airway Management Planned: Mask  Additional Equipment:   Intra-op Plan:   Post-operative Plan:   Informed Consent: I have reviewed the patients History and Physical, chart, labs and discussed the procedure including the risks, benefits and alternatives for the proposed anesthesia with the patient or authorized representative who has indicated his/her understanding and acceptance.     Dental advisory given  Plan Discussed with: CRNA  Anesthesia Plan Comments:        Anesthesia Quick Evaluation

## 2020-05-27 NOTE — Transfer of Care (Signed)
Immediate Anesthesia Transfer of Care Note  Patient: Benjamin Fuller  Procedure(s) Performed: CARDIOVERSION (N/A )  Patient Location: PACU  Anesthesia Type:General  Level of Consciousness: drowsy  Airway & Oxygen Therapy: Patient Spontanous Breathing and Patient connected to nasal cannula oxygen  Post-op Assessment: Report given to RN and Post -op Vital signs reviewed and stable  Post vital signs: Reviewed and stable  Last Vitals:  Vitals Value Taken Time  BP    Temp    Pulse    Resp    SpO2      Last Pain:  Vitals:   05/27/20 0816  TempSrc: Oral  PainSc: 0-No pain         Complications: No apparent anesthesia complications

## 2020-05-27 NOTE — Interval H&P Note (Signed)
History and Physical Interval Note:  05/27/2020 8:25 AM  Benjamin Fuller  has presented today for surgery, with the diagnosis of AFIB.  The various methods of treatment have been discussed with the patient and family. After consideration of risks, benefits and other options for treatment, the patient has consented to  Procedure(s): CARDIOVERSION (N/A) as a surgical intervention.  The patient's history has been reviewed, patient examined, no change in status, stable for surgery.  I have reviewed the patient's chart and labs.  Questions were answered to the patient's satisfaction.     Mertie Moores

## 2020-05-27 NOTE — CV Procedure (Signed)
Cardioversion Note  BENIGNO MOSENG [id number] September 06, 1941  Procedure: DC Cardioversion Indications: atrial fib   Procedure Details Consent: Obtained Time Out: Verified patient identification, verified procedure, site/side was marked, verified correct patient position, special equipment/implants available, Radiology Safety Procedures followed,  medications/allergies/relevent history reviewed, required imaging and test results available.  Performed  The patient has been on adequate anticoagulation.  The patient received IV Lidocaine 60 mg followed by Propofol 100 mg IV  for sedation.  Synchronous cardioversion was performed at  200  joules.  The cardioversion was successful.     Complications: No apparent complications Patient did tolerate procedure well.   Vesta Mixer, Montez Hageman., MD, [facility] 05/27/2020, 9:06 AM

## 2020-05-27 NOTE — Anesthesia Procedure Notes (Signed)
Procedure Name: General with mask airway Date/Time: 05/27/2020 8:46 AM Performed by: Leonor Liv, CRNA Patient Re-evaluated:Patient Re-evaluated prior to induction Oxygen Delivery Method: Ambu bag Dental Injury: Teeth and Oropharynx as per pre-operative assessment

## 2020-05-27 NOTE — Discharge Instructions (Signed)
Electrical Cardioversion Electrical cardioversion is the delivery of a jolt of electricity to restore a normal rhythm to the heart. A rhythm that is too fast or is not regular keeps the heart from pumping well. In this procedure, sticky patches or metal paddles are placed on the chest to deliver electricity to the heart from a device. This procedure may be done in an emergency if:  There is low or no blood pressure as a result of the heart rhythm.  Normal rhythm must be restored as fast as possible to protect the brain and heart from further damage.  It may save a life. This may also be a scheduled procedure for irregular or fast heart rhythms that are not immediately life-threatening. Tell a health care provider about:  Any allergies you have.  All medicines you are taking, including vitamins, herbs, eye drops, creams, and over-the-counter medicines.  Any problems you or family members have had with anesthetic medicines.  Any blood disorders you have.  Any surgeries you have had.  Any medical conditions you have.  Whether you are pregnant or may be pregnant. What are the risks? Generally, this is a safe procedure. However, problems may occur, including:  Allergic reactions to medicines.  A blood clot that breaks free and travels to other parts of your body.  The possible return of an abnormal heart rhythm within hours or days after the procedure.  Your heart stopping (cardiac arrest). This is rare. What happens before the procedure? Medicines  Your health care provider may have you start taking: ? Blood-thinning medicines (anticoagulants) so your blood does not clot as easily. ? Medicines to help stabilize your heart rate and rhythm.  Ask your health care provider about: ? Changing or stopping your regular medicines. This is especially important if you are taking diabetes medicines or blood thinners. ? Taking medicines such as aspirin and ibuprofen. These medicines can  thin your blood. Do not take these medicines unless your health care provider tells you to take them. ? Taking over-the-counter medicines, vitamins, herbs, and supplements. General instructions  Follow instructions from your health care provider about eating or drinking restrictions.  Plan to have someone take you home from the hospital or clinic.  If you will be going home right after the procedure, plan to have someone with you for 24 hours.  Ask your health care provider what steps will be taken to help prevent infection. These may include washing your skin with a germ-killing soap. What happens during the procedure?   An IV will be inserted into one of your veins.  Sticky patches (electrodes) or metal paddles may be placed on your chest.  You will be given a medicine to help you relax (sedative).  An electrical shock will be delivered. The procedure may vary among health care providers and hospitals. What can I expect after the procedure?  Your blood pressure, heart rate, breathing rate, and blood oxygen level will be monitored until you leave the hospital or clinic.  Your heart rhythm will be watched to make sure it does not change.  You may have some redness on the skin where the shocks were given. Follow these instructions at home:  Do not drive for 24 hours if you were given a sedative during your procedure.  Take over-the-counter and prescription medicines only as told by your health care provider.  Ask your health care provider how to check your pulse. Check it often.  Rest for 48 hours after the procedure or   as told by your health care provider.  Avoid or limit your caffeine use as told by your health care provider.  Keep all follow-up visits as told by your health care provider. This is important. Contact a health care provider if:  You feel like your heart is beating too quickly or your pulse is not regular.  You have a serious muscle cramp that does not go  away. Get help right away if:  You have discomfort in your chest.  You are dizzy or you feel faint.  You have trouble breathing or you are short of breath.  Your speech is slurred.  You have trouble moving an arm or leg on one side of your body.  Your fingers or toes turn cold or blue. Summary  Electrical cardioversion is the delivery of a jolt of electricity to restore a normal rhythm to the heart.  This procedure may be done right away in an emergency or may be a scheduled procedure if the condition is not an emergency.  Generally, this is a safe procedure.  After the procedure, check your pulse often as told by your health care provider. This information is not intended to replace advice given to you by your health care provider. Make sure you discuss any questions you have with your health care provider. Document Revised: 07/20/2019 Document Reviewed: 07/20/2019 Elsevier Patient Education  2020 [employer]  

## 2020-05-28 NOTE — Anesthesia Postprocedure Evaluation (Signed)
Anesthesia Post Note  Patient: Wallis Mart  Procedure(s) Performed: CARDIOVERSION (N/A )     Patient location during evaluation: Endoscopy Anesthesia Type: General Level of consciousness: awake and alert Pain management: pain level controlled Vital Signs Assessment: post-procedure vital signs reviewed and stable Respiratory status: spontaneous breathing, nonlabored ventilation, respiratory function stable and patient connected to nasal cannula oxygen Cardiovascular status: blood pressure returned to baseline and stable Postop Assessment: no apparent nausea or vomiting Anesthetic complications: no    Last Vitals:  Vitals:   05/27/20 0910 05/27/20 0920  BP: (!) 94/51 (!) 92/34  Pulse: 65 70  Resp: 18 16  Temp:    SpO2: 96% 95%    Last Pain:  Vitals:   05/27/20 0920  TempSrc:   PainSc: 0-No pain                 Joaquina Nissen P Madhuri Vacca

## 2020-06-01 ENCOUNTER — Other Ambulatory Visit: Payer: Self-pay

## 2020-06-01 ENCOUNTER — Encounter: Payer: Self-pay | Admitting: Cardiology

## 2020-06-01 ENCOUNTER — Ambulatory Visit (INDEPENDENT_AMBULATORY_CARE_PROVIDER_SITE_OTHER): Payer: Medicare Other | Admitting: Cardiology

## 2020-06-01 VITALS — BP 128/84 | HR 90 | Ht 66.0 in | Wt 170.6 lb

## 2020-06-01 DIAGNOSIS — I10 Essential (primary) hypertension: Secondary | ICD-10-CM | POA: Diagnosis not present

## 2020-06-01 DIAGNOSIS — R5383 Other fatigue: Secondary | ICD-10-CM | POA: Diagnosis not present

## 2020-06-01 DIAGNOSIS — I480 Paroxysmal atrial fibrillation: Secondary | ICD-10-CM

## 2020-06-01 DIAGNOSIS — E785 Hyperlipidemia, unspecified: Secondary | ICD-10-CM | POA: Insufficient documentation

## 2020-06-01 DIAGNOSIS — Z7901 Long term (current) use of anticoagulants: Secondary | ICD-10-CM | POA: Diagnosis not present

## 2020-06-01 DIAGNOSIS — E119 Type 2 diabetes mellitus without complications: Secondary | ICD-10-CM | POA: Diagnosis not present

## 2020-06-01 NOTE — Patient Instructions (Signed)
Medication Instructions:  Your Physician recommend you continue on your current medication as directed.    *If you need a refill on your cardiac medications before your next appointment, please call your pharmacy*   Lab Work: None   Testing/Procedures: None   Follow-Up: At [facility], you and your health needs are our priority.  As part of our continuing mission to provide you with exceptional heart care, we have created designated Provider Care Teams.  These Care Teams include your primary Cardiologist (physician) and Advanced Practice Providers (APPs -  Physician Assistants and Nurse Practitioners) who all work together to provide you with the care you need, when you need it.  We recommend signing up for the patient portal called "MyChart".  Sign up information is provided on this After Visit Summary.  MyChart is used to connect with patients for Virtual Visits (Telemedicine).  Patients are able to view lab/test results, encounter notes, upcoming appointments, etc.  Non-urgent messages can be sent to your provider as well.   To learn more about what you can do with MyChart, go to [email or web address].    Your next appointment:   4-6 week(s)  The format for your next appointment:   Virtual Visit   Provider:   Buford Dresser, MD
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
## 2020-06-29 DIAGNOSIS — I4891 Unspecified atrial fibrillation: Secondary | ICD-10-CM | POA: Diagnosis not present

## 2020-06-29 DIAGNOSIS — E119 Type 2 diabetes mellitus without complications: Secondary | ICD-10-CM | POA: Diagnosis not present

## 2020-06-29 DIAGNOSIS — E782 Mixed hyperlipidemia: Secondary | ICD-10-CM | POA: Diagnosis not present

## 2020-06-29 DIAGNOSIS — E1169 Type 2 diabetes mellitus with other specified complication: Secondary | ICD-10-CM | POA: Diagnosis not present

## 2020-06-29 DIAGNOSIS — I10 Essential (primary) hypertension: Secondary | ICD-10-CM | POA: Diagnosis not present

## 2020-06-29 DIAGNOSIS — E139 Other specified diabetes mellitus without complications: Secondary | ICD-10-CM | POA: Diagnosis not present

## 2020-06-29 DIAGNOSIS — N400 Enlarged prostate without lower urinary tract symptoms: Secondary | ICD-10-CM | POA: Diagnosis not present

## 2020-07-06 ENCOUNTER — Encounter: Payer: Self-pay | Admitting: Cardiology

## 2020-07-06 ENCOUNTER — Telehealth (INDEPENDENT_AMBULATORY_CARE_PROVIDER_SITE_OTHER): Payer: Medicare Other | Admitting: Cardiology

## 2020-07-06 VITALS — BP 106/58 | HR 65 | Ht 66.0 in | Wt 169.0 lb

## 2020-07-06 DIAGNOSIS — R5383 Other fatigue: Secondary | ICD-10-CM | POA: Diagnosis not present

## 2020-07-06 DIAGNOSIS — I480 Paroxysmal atrial fibrillation: Secondary | ICD-10-CM

## 2020-07-06 DIAGNOSIS — I10 Essential (primary) hypertension: Secondary | ICD-10-CM | POA: Diagnosis not present

## 2020-07-06 DIAGNOSIS — E785 Hyperlipidemia, unspecified: Secondary | ICD-10-CM

## 2020-07-06 DIAGNOSIS — Z7901 Long term (current) use of anticoagulants: Secondary | ICD-10-CM

## 2020-07-06 DIAGNOSIS — E119 Type 2 diabetes mellitus without complications: Secondary | ICD-10-CM
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
## 2020-07-06 NOTE — Patient Instructions (Addendum)

## 2020-07-12 DIAGNOSIS — G8929 Other chronic pain: Secondary | ICD-10-CM | POA: Diagnosis not present

## 2020-07-12 DIAGNOSIS — M4722 Other spondylosis with radiculopathy, cervical region: Secondary | ICD-10-CM | POA: Diagnosis not present

## 2020-07-12 DIAGNOSIS — M25511 Pain in right shoulder: Secondary | ICD-10-CM | POA: Diagnosis not present

## 2020-08-01 ENCOUNTER — Telehealth: Payer: Self-pay | Admitting: Cardiology

## 2020-08-01 DIAGNOSIS — Z0000 Encounter for general adult medical examination without abnormal findings: Secondary | ICD-10-CM | POA: Diagnosis not present

## 2020-08-01 NOTE — Telephone Encounter (Signed)
Called patient, he states that last week he started not feeling so well, very fatigued- and just not himself. He states he mostly noticed it Friday and Saturday, that his HR would jump from 100's, down to the 80's, but he states that today he feels fine- not unusual. His HR this morning was 78 and blood pressure 114/60. Patient denies chest pain, shortness of breath, palpitation feeling, or swelling. He only complaints of having no energy and just wanting to nap. He states he thinks he has been drinking plenty of water, but he states at times he feels that he is very thirsty and could probably be drinking more water. I did suggest he try to increase his water intake and to monitor his symptoms since he felt okay for now and to call back if he notices the jump in HR again. Patient does have appointment next Friday 08/13 with Dr.Christopher. No changes in medication. Advised I would route to MD to make aware and would call back with any other suggestions.

## 2020-08-01 NOTE — Telephone Encounter (Signed)
Patient c/o Palpitations:  High priority if patient c/o lightheadedness, shortness of breath, or chest pain  1) How long have you had palpitations/irregular HR/ Afib? Are you having the symptoms now?  Pt says he have been in Atrial Fib  2) Are you currently experiencing lightheadedness, SOB or CP? no  3) Do you have a history of afib (atrial fibrillation) or irregular heart rhythm? History of AAdib  4) Have you checked your BP or HR? (document readings if available): 101 90's this morning it is 78  5) Are you experiencing any other symptoms?  fatigued

## 2020-08-05 DIAGNOSIS — I4891 Unspecified atrial fibrillation: Secondary | ICD-10-CM | POA: Diagnosis not present

## 2020-08-05 DIAGNOSIS — Z86010 Personal history of colonic polyps: Secondary | ICD-10-CM | POA: Diagnosis not present

## 2020-08-05 DIAGNOSIS — E782 Mixed hyperlipidemia: Secondary | ICD-10-CM | POA: Diagnosis not present

## 2020-08-05 DIAGNOSIS — E538 Deficiency of other specified B group vitamins: Secondary | ICD-10-CM | POA: Diagnosis not present

## 2020-08-05 DIAGNOSIS — Z7984 Long term (current) use of oral hypoglycemic drugs: Secondary | ICD-10-CM | POA: Diagnosis not present

## 2020-08-05 DIAGNOSIS — E1169 Type 2 diabetes mellitus with other specified complication: Secondary | ICD-10-CM | POA: Diagnosis not present

## 2020-08-05 DIAGNOSIS — K760 Fatty (change of) liver, not elsewhere classified: Secondary | ICD-10-CM | POA: Diagnosis not present

## 2020-08-05 DIAGNOSIS — L570 Actinic keratosis: Secondary | ICD-10-CM | POA: Diagnosis not present

## 2020-08-05 DIAGNOSIS — Z79899 Other long term (current) drug therapy: Secondary | ICD-10-CM | POA: Diagnosis not present

## 2020-08-05 DIAGNOSIS — N400 Enlarged prostate without lower urinary tract symptoms: Secondary | ICD-10-CM | POA: Diagnosis not present

## 2020-08-05 DIAGNOSIS — I10 Essential (primary) hypertension: Secondary | ICD-10-CM | POA: Diagnosis not present

## 2020-08-08 NOTE — Telephone Encounter (Signed)
Agree with water intake. Please ask him to bring log of blood pressures and heart rates to his upcoming visit, and we will get an ECG at that time. Thanks.

## 2020-08-09 NOTE — Telephone Encounter (Signed)
Pt updated with MD's recommendations and verbalized understanding. Pt will bring log and increase water intake.

## 2020-08-10 ENCOUNTER — Encounter: Payer: Self-pay | Admitting: Cardiology

## 2020-08-10 ENCOUNTER — Other Ambulatory Visit: Payer: Self-pay

## 2020-08-10 ENCOUNTER — Ambulatory Visit (INDEPENDENT_AMBULATORY_CARE_PROVIDER_SITE_OTHER): Payer: Medicare Other | Admitting: Cardiology

## 2020-08-10 VITALS — BP 100/60 | HR 70 | Ht 66.0 in | Wt 168.6 lb

## 2020-08-10 DIAGNOSIS — E785 Hyperlipidemia, unspecified: Secondary | ICD-10-CM

## 2020-08-10 DIAGNOSIS — I480 Paroxysmal atrial fibrillation: Secondary | ICD-10-CM

## 2020-08-10 DIAGNOSIS — I10 Essential (primary) hypertension: Secondary | ICD-10-CM | POA: Diagnosis not present

## 2020-08-10 DIAGNOSIS — Z7901 Long term (current) use of anticoagulants: Secondary | ICD-10-CM

## 2020-08-10 DIAGNOSIS — Z7189 Other specified counseling: Secondary | ICD-10-CM

## 2020-08-10 DIAGNOSIS — E119 Type 2 diabetes mellitus without complications: Secondary | ICD-10-CM

## 2020-08-10 DIAGNOSIS — R5383 Other fatigue: Secondary | ICD-10-CM | POA: Diagnosis not present
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
## 2020-08-10 NOTE — Patient Instructions (Signed)

## 2020-08-11 ENCOUNTER — Encounter: Payer: Self-pay | Admitting: Cardiology

## 2020-08-12 ENCOUNTER — Ambulatory Visit: Payer: Medicare Other | Admitting: Cardiology

## 2020-08-15 DIAGNOSIS — M25511 Pain in right shoulder: Secondary | ICD-10-CM | POA: Diagnosis not present

## 2020-08-15 DIAGNOSIS — M25512 Pain in left shoulder: Secondary | ICD-10-CM | POA: Diagnosis not present

## 2020-08-26 DIAGNOSIS — Z0000 Encounter for general adult medical examination without abnormal findings: Secondary | ICD-10-CM | POA: Diagnosis not present

## 2020-08-26 DIAGNOSIS — E538 Deficiency of other specified B group vitamins: Secondary | ICD-10-CM | POA: Diagnosis not present

## 2020-08-26 DIAGNOSIS — Z79899 Other long term (current) drug therapy: Secondary | ICD-10-CM | POA: Diagnosis not present

## 2020-08-26 DIAGNOSIS — E782 Mixed hyperlipidemia: Secondary | ICD-10-CM | POA: Diagnosis not present

## 2020-08-26 DIAGNOSIS — Z86010 Personal history of colonic polyps: Secondary | ICD-10-CM | POA: Diagnosis not present

## 2020-08-26 DIAGNOSIS — I10 Essential (primary) hypertension: Secondary | ICD-10-CM | POA: Diagnosis not present

## 2020-08-26 DIAGNOSIS — Z7984 Long term (current) use of oral hypoglycemic drugs: Secondary | ICD-10-CM | POA: Diagnosis not present

## 2020-08-26 DIAGNOSIS — L570 Actinic keratosis: Secondary | ICD-10-CM | POA: Diagnosis not present

## 2020-08-26 DIAGNOSIS — I4891 Unspecified atrial fibrillation: Secondary | ICD-10-CM | POA: Diagnosis not present

## 2020-08-26 DIAGNOSIS — K760 Fatty (change of) liver, not elsewhere classified: Secondary | ICD-10-CM | POA: Diagnosis not present

## 2020-08-26 DIAGNOSIS — E1169 Type 2 diabetes mellitus with other specified complication: Secondary | ICD-10-CM | POA: Diagnosis not present

## 2020-08-26 DIAGNOSIS — N400 Enlarged prostate without lower urinary tract symptoms: Secondary | ICD-10-CM | POA: Diagnosis not present

## 2020-09-14 DIAGNOSIS — M79641 Pain in right hand: Secondary | ICD-10-CM | POA: Diagnosis not present

## 2020-09-26 DIAGNOSIS — G5601 Carpal tunnel syndrome, right upper limb: Secondary | ICD-10-CM | POA: Diagnosis not present

## 2020-10-05 DIAGNOSIS — G5601 Carpal tunnel syndrome, right upper limb: Secondary | ICD-10-CM | POA: Diagnosis not present

## 2020-10-07 ENCOUNTER — Telehealth: Payer: Self-pay | Admitting: Cardiology

## 2020-10-07 NOTE — Telephone Encounter (Signed)
Can we just confirm that this will be a local block and not a spinal block

## 2020-10-07 NOTE — Telephone Encounter (Signed)
   [facility] Pre-operative Risk Assessment    HEARTCARE STAFF: - Please ensure there is not already an duplicate clearance open for this procedure. - Under Visit Info/Reason for Call, type in Other and utilize the format Clearance MM/DD/YY or Clearance TBD. Do not use dashes or single digits. - If request is for dental extraction, please clarify the # of teeth to be extracted.  Request for surgical clearance:  1. What type of surgery is being performed?  Revision of Carpal Tunnel   2. When is this surgery scheduled?10-18-20   3. What type of clearance is required (medical clearance vs. Pharmacy clearance to hold med vs. Both)?  Both  4. Are there any medications that need to be held prior to surgery and how long? Eliquis   5. Practice name and name of physician performing surgery?  Dr Roseanne Kaufman 6. What is the office phone number? [phone]   7.   What is the office fax number? [phone]  8.   Anesthesia type (None, local, MAC, general) ?  Block with IV Sedation   Glyn Ade 10/07/2020, 10:40 AM  _________________________________________________________________   (provider comments below)

## 2020-10-07 NOTE — Telephone Encounter (Signed)
Patient with diagnosis of afib on Eliquis for anticoagulation.    Procedure: Revision of Carpal Tunnel  Date of procedure: 10/15/2020  CHADS2-VASc score of  4 (HTN, AGE, DM2, AGE)  CrCl 65.9 ml/min  Per office protocol, patient can hold Eliquis for 2 days prior to procedure.

## 2020-10-07 NOTE — Telephone Encounter (Signed)
Per office visit note by Dr. Harrell Gave 08/10/20:  "Fatigue: has been intermittent, has days where he feels great and others when he is tired.  -we again discussed nuclear stress. He wants to wait, but counseled to call me if he changes his mind -heart rate well controlled on metoprolol succinate low dose. Will not change today. -we discussed red flag warning signs that need immediate medical attention"  Dr. Harrell Gave, does patient needs OV or stress test ?  Pharmacy to review anticoagulation.

## 2020-10-07 NOTE — Telephone Encounter (Signed)
Sherri called to follow up. She confirmed that this will be a local block and not a spinal block.

## 2020-10-07 NOTE — Telephone Encounter (Signed)
Patient with diagnosis of afib on Eliquis for anticoagulation.    Procedure: Revision of Carpal Tunnel Date of procedure: 10/15/2020  CHADS2-VASc score of  4 (HTN, AGE, DM2, AGE)  CrCl 65.9 ml/min  Since anesthesia is local and not spinal- Per office protocol, patient can hold Eliquis for 2 days prior to procedure.

## 2020-10-07 NOTE — Telephone Encounter (Signed)
Left message for Sherri, surgery scheduler to call back with type of anesthesia being used, Local and not spinal block.

## 2020-10-15 ENCOUNTER — Ambulatory Visit: Payer: Medicare Other

## 2020-10-16 NOTE — Telephone Encounter (Signed)
Was stable at prior visit. Due for follow up 11/15, but based on prior visit he is acceptable risk for carpal tunnel revision without further testing

## 2020-10-17 NOTE — Telephone Encounter (Signed)
   Primary Cardiologist: Buford Dresser, MD  Chart reviewed as part of pre-operative protocol coverage. Patient was contacted 10/17/2020 in reference to pre-operative risk assessment for pending surgery as outlined below.  Benjamin Fuller was last seen on 08/10/20 by Dr. Harrell Gave.  Since that day, Benjamin Fuller has done well. Per Dr. Harrell Gave, he was stable at prior visit. Due for follow up 11/15, but based on prior visit he is acceptable risk for carpal tunnel revision without further testing.  Per our clinical pharmacist; Patient with diagnosis ofafibon Eliquisfor anticoagulation.   Procedure:Revision of Carpal Tunnel Date of procedure:10/15/2020  CHADS2-VASc score of4(HTN, AGE, DM2,AGE)  CrCl65.9 ml/min  Since anesthesia is local and not spinal- Per office protocol, patient can holdEliquisfor 2days prior to procedure.  Therefore, based on ACC/AHA guidelines, the patient would be at acceptable risk for the planned procedure without further cardiovascular testing.   The patient was advised that if he develops new symptoms prior to surgery to contact our office to arrange for a follow-up visit, and he verbalized understanding.  I will route this recommendation to the requesting party via Epic fax function and remove from pre-op pool. Please call with questions.  Tami Lin Markeith Jue, PA 10/17/2020, 8:12 AM

## 2020-10-18 DIAGNOSIS — M25631 Stiffness of right wrist, not elsewhere classified: Secondary | ICD-10-CM | POA: Diagnosis not present

## 2020-10-18 DIAGNOSIS — G8918 Other acute postprocedural pain: Secondary | ICD-10-CM | POA: Diagnosis not present

## 2020-10-18 DIAGNOSIS — G5601 Carpal tunnel syndrome, right upper limb: Secondary | ICD-10-CM | POA: Diagnosis not present

## 2020-10-21 DIAGNOSIS — I10 Essential (primary) hypertension: Secondary | ICD-10-CM | POA: Diagnosis not present

## 2020-10-21 DIAGNOSIS — E1169 Type 2 diabetes mellitus with other specified complication: Secondary | ICD-10-CM | POA: Diagnosis not present

## 2020-10-21 DIAGNOSIS — I4891 Unspecified atrial fibrillation: Secondary | ICD-10-CM | POA: Diagnosis not present

## 2020-10-21 DIAGNOSIS — E782 Mixed hyperlipidemia: Secondary | ICD-10-CM | POA: Diagnosis not present

## 2020-10-21 DIAGNOSIS — N400 Enlarged prostate without lower urinary tract symptoms: Secondary | ICD-10-CM | POA: Diagnosis not present

## 2020-10-21 DIAGNOSIS — E119 Type 2 diabetes mellitus without complications: Secondary | ICD-10-CM | POA: Diagnosis not present

## 2020-10-27 DIAGNOSIS — G5601 Carpal tunnel syndrome, right upper limb: Secondary | ICD-10-CM | POA: Diagnosis not present

## 2020-10-27 DIAGNOSIS — M79641 Pain in right hand: Secondary | ICD-10-CM | POA: Diagnosis not present

## 2020-10-27 DIAGNOSIS — Z4789 Encounter for other orthopedic aftercare: Secondary | ICD-10-CM | POA: Diagnosis not present

## 2020-10-29 ENCOUNTER — Ambulatory Visit: Payer: Medicare Other | Attending: Internal Medicine

## 2020-10-29 DIAGNOSIS — Z23 Encounter for immunization: Secondary | ICD-10-CM

## 2020-10-29 NOTE — Progress Notes (Signed)
Covid-19 Vaccination Clinic  Name:  Benjamin Fuller    MRN: [id number] DOB: Dec 22, 1941  10/29/2020  [name] was observed post Covid-19 immunization for 15 minutes without incident. He was provided with Vaccine Information Sheet and instruction to access the V-Safe system.   [name] was instructed to call 911 with any severe reactions post vaccine: Marland Kitchen Difficulty breathing  . Swelling of face and throat  . A fast heartbeat  . A bad rash all over body  . Dizziness and weakness

## 2020-11-09 DIAGNOSIS — M25641 Stiffness of right hand, not elsewhere classified: Secondary | ICD-10-CM | POA: Diagnosis not present

## 2020-11-14 ENCOUNTER — Encounter: Payer: Self-pay | Admitting: Cardiology

## 2020-11-14 ENCOUNTER — Ambulatory Visit (INDEPENDENT_AMBULATORY_CARE_PROVIDER_SITE_OTHER): Payer: Medicare Other | Admitting: Cardiology

## 2020-11-14 ENCOUNTER — Other Ambulatory Visit: Payer: Self-pay

## 2020-11-14 VITALS — BP 118/78 | HR 81 | Ht 66.0 in | Wt 176.8 lb

## 2020-11-14 DIAGNOSIS — Z7901 Long term (current) use of anticoagulants: Secondary | ICD-10-CM | POA: Diagnosis not present

## 2020-11-14 DIAGNOSIS — I480 Paroxysmal atrial fibrillation: Secondary | ICD-10-CM

## 2020-11-14 DIAGNOSIS — E785 Hyperlipidemia, unspecified: Secondary | ICD-10-CM | POA: Diagnosis not present

## 2020-11-14 DIAGNOSIS — E119 Type 2 diabetes mellitus without complications: Secondary | ICD-10-CM

## 2020-11-14 DIAGNOSIS — I10 Essential (primary) hypertension: Secondary | ICD-10-CM | POA: Diagnosis not present

## 2020-11-14 NOTE — Progress Notes (Signed)
ekg 

## 2020-11-14 NOTE — Patient Instructions (Signed)
Medication Instructions:  Your Physician recommend you continue on your current medication as directed.    *If you need a refill on your cardiac medications before your next appointment, please call your pharmacy*   Lab Work: None  Testing/Procedures: None   Follow-Up: At [facility], you and your health needs are our priority.  As part of our continuing mission to provide you with exceptional heart care, we have created designated Provider Care Teams.  These Care Teams include your primary Cardiologist (physician) and Advanced Practice Providers (APPs -  Physician Assistants and Nurse Practitioners) who all work together to provide you with the care you need, when you need it.  We recommend signing up for the patient portal called "MyChart".  Sign up information is provided on this After Visit Summary.  MyChart is used to connect with patients for Virtual Visits (Telemedicine).  Patients are able to view lab/test results, encounter notes, upcoming appointments, etc.  Non-urgent messages can be sent to your provider as well.   To learn more about what you can do with MyChart, go to [email or web address].    Your next appointment:   6-8 week(s)  The format for your next appointment:   In Person  Provider:   Buford Dresser, MD
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
## 2020-11-15 DIAGNOSIS — M25641 Stiffness of right hand, not elsewhere classified: Secondary | ICD-10-CM | POA: Diagnosis not present

## 2020-11-22 DIAGNOSIS — I10 Essential (primary) hypertension: Secondary | ICD-10-CM | POA: Diagnosis not present

## 2020-11-22 DIAGNOSIS — E139 Other specified diabetes mellitus without complications: Secondary | ICD-10-CM | POA: Diagnosis not present

## 2020-11-22 DIAGNOSIS — E119 Type 2 diabetes mellitus without complications: Secondary | ICD-10-CM | POA: Diagnosis not present

## 2020-11-22 DIAGNOSIS — N400 Enlarged prostate without lower urinary tract symptoms: Secondary | ICD-10-CM | POA: Diagnosis not present

## 2020-11-22 DIAGNOSIS — E1169 Type 2 diabetes mellitus with other specified complication: Secondary | ICD-10-CM | POA: Diagnosis not present

## 2020-11-22 DIAGNOSIS — I4891 Unspecified atrial fibrillation: Secondary | ICD-10-CM | POA: Diagnosis not present

## 2020-11-22 DIAGNOSIS — M25641 Stiffness of right hand, not elsewhere classified: Secondary | ICD-10-CM | POA: Diagnosis not present

## 2020-11-22 DIAGNOSIS — E782 Mixed hyperlipidemia: Secondary | ICD-10-CM | POA: Diagnosis not present

## 2020-11-29 DIAGNOSIS — I10 Essential (primary) hypertension: Secondary | ICD-10-CM | POA: Diagnosis not present

## 2020-11-29 DIAGNOSIS — Z7984 Long term (current) use of oral hypoglycemic drugs: Secondary | ICD-10-CM | POA: Diagnosis not present

## 2020-11-29 DIAGNOSIS — I4891 Unspecified atrial fibrillation: Secondary | ICD-10-CM | POA: Diagnosis not present

## 2020-11-29 DIAGNOSIS — M25641 Stiffness of right hand, not elsewhere classified: Secondary | ICD-10-CM | POA: Diagnosis not present

## 2020-11-29 DIAGNOSIS — E1169 Type 2 diabetes mellitus with other specified complication: Secondary | ICD-10-CM | POA: Diagnosis not present

## 2020-11-29 DIAGNOSIS — R42 Dizziness and giddiness: Secondary | ICD-10-CM | POA: Diagnosis not present

## 2020-12-08 DIAGNOSIS — M25641 Stiffness of right hand, not elsewhere classified: Secondary | ICD-10-CM | POA: Diagnosis not present

## 2020-12-14 DIAGNOSIS — M25641 Stiffness of right hand, not elsewhere classified: Secondary | ICD-10-CM | POA: Diagnosis not present

## 2020-12-22 DIAGNOSIS — M25641 Stiffness of right hand, not elsewhere classified: Secondary | ICD-10-CM | POA: Diagnosis not present

## 2020-12-28 DIAGNOSIS — E1169 Type 2 diabetes mellitus with other specified complication: Secondary | ICD-10-CM | POA: Diagnosis not present

## 2020-12-28 DIAGNOSIS — E782 Mixed hyperlipidemia: Secondary | ICD-10-CM | POA: Diagnosis not present

## 2020-12-28 DIAGNOSIS — I4891 Unspecified atrial fibrillation: Secondary | ICD-10-CM | POA: Diagnosis not present

## 2020-12-28 DIAGNOSIS — E119 Type 2 diabetes mellitus without complications: Secondary | ICD-10-CM | POA: Diagnosis not present

## 2020-12-28 DIAGNOSIS — N400 Enlarged prostate without lower urinary tract symptoms: Secondary | ICD-10-CM | POA: Diagnosis not present

## 2020-12-28 DIAGNOSIS — E139 Other specified diabetes mellitus without complications: Secondary | ICD-10-CM | POA: Diagnosis not present

## 2020-12-28 DIAGNOSIS — I10 Essential (primary) hypertension: Secondary | ICD-10-CM | POA: Diagnosis not present

## 2020-12-29 DIAGNOSIS — M25641 Stiffness of right hand, not elsewhere classified: Secondary | ICD-10-CM | POA: Diagnosis not present

## 2021-01-09 ENCOUNTER — Encounter: Payer: Self-pay | Admitting: Cardiology

## 2021-01-09 ENCOUNTER — Other Ambulatory Visit: Payer: Self-pay

## 2021-01-09 ENCOUNTER — Ambulatory Visit (INDEPENDENT_AMBULATORY_CARE_PROVIDER_SITE_OTHER): Payer: Medicare Other | Admitting: Cardiology

## 2021-01-09 VITALS — BP 138/64 | HR 93 | Ht 66.0 in | Wt 178.0 lb

## 2021-01-09 DIAGNOSIS — I480 Paroxysmal atrial fibrillation: Secondary | ICD-10-CM

## 2021-01-09 DIAGNOSIS — Z7901 Long term (current) use of anticoagulants: Secondary | ICD-10-CM | POA: Diagnosis not present

## 2021-01-09 DIAGNOSIS — I10 Essential (primary) hypertension: Secondary | ICD-10-CM | POA: Diagnosis not present

## 2021-01-09 DIAGNOSIS — R0602 Shortness of breath: Secondary | ICD-10-CM

## 2021-01-09 DIAGNOSIS — I4819 Other persistent atrial fibrillation: Secondary | ICD-10-CM

## 2021-01-09 DIAGNOSIS — R5383 Other fatigue: Secondary | ICD-10-CM | POA: Diagnosis not present

## 2021-01-09 DIAGNOSIS — E119 Type 2 diabetes mellitus without complications: Secondary | ICD-10-CM

## 2021-01-09 MED ORDER — FUROSEMIDE 40 MG PO TABS: 40.0000 mg | ORAL_TABLET | Freq: Every day | ORAL | 3 refills | Status: AC | PRN

## 2021-01-09 MED ORDER — METOPROLOL SUCCINATE ER 25 MG PO TB24: 25.0000 mg | ORAL_TABLET | Freq: Every day | ORAL | 3 refills | Status: DC
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
## 2021-01-09 NOTE — Patient Instructions (Addendum)
Take lasix (furosemide) daily for 5 days, then take as needed daily for swelling, shortness of breath, weight gain.  We will set you up for cardioversion  Medication Instructions:  Take Lasix as Directed  *If you need a refill on your cardiac medications before your next appointment, please call your pharmacy*   Lab Work: CBC, CMP If you have labs (blood work) drawn today and your tests are completely normal, you will receive your results only by: Marland Kitchen MyChart Message (if you have MyChart) OR . A paper copy in the mail If you have any lab test that is abnormal or we need to change your treatment, we will call you to review the results.   Testing/Procedures: Dear [name] are scheduled for a TEE/Cardioversion/TEE Cardioversion on January 20th, 2020 At 11:30am with Dr. Marlou Porch.  Please arrive at the [facility] (Main Entrance A) at [facility]: [address] [place], [place] [address] at 10:30 am. (1 hour prior to procedure unless lab work is needed; if lab work is needed arrive 1.5 hours ahead)  DIET: Nothing to eat or drink after midnight except a sip of water with medications (see medication instructions below)  Medication Instructions:   Continue your anticoagulant: Eliquis You will need to continue your anticoagulant after your procedure until you  are told by your  Provider that it is safe to stop   Labs: If patient is on Coumadin, patient needs pt/INR, CBC, BMET within 3 days (No pt/INR needed for patients taking Xarelto, Eliquis, Pradaxa) For patients receiving anesthesia for TEE and all Cardioversion patients: BMET, CBC within 1 week  Come to: [place], [address] (Lab option #1) Come to the lab at [facility] between the hours of 8:00 am and 4:30 pm. You do not have to be fasting. (Lab option #2) Lab at an alternate location (Lab option #3) your lab work will be done at the hospital prior to your procedure - you will need to arrive 1   hours ahead of your procedure  You must have a responsible person to drive you home and stay in the waiting area during your procedure. Failure to do so could result in cancellation.  Bring your insurance cards.  *Special Note: Every effort is made to have your procedure done on time. Occasionally there are emergencies that occur at the hospital that may cause delays. Please be patient if a delay does occur.     Follow-Up: At [facility], you and your health needs are our priority.  As part of our continuing mission to provide you with exceptional heart care, we have created designated Provider Care Teams.  These Care Teams include your primary Cardiologist (physician) and Advanced Practice Providers (APPs -  Physician Assistants and Nurse Practitioners) who all work together to provide you with the care you need, when you need it.  We recommend signing up for the patient portal called "MyChart".  Sign up information is provided on this After Visit Summary.  MyChart is used to connect with patients for Virtual Visits (Telemedicine).  Patients are able to view lab/test results, encounter notes, upcoming appointments, etc.  Non-urgent messages can be sent to your provider as well.   To learn more about what you can do with MyChart, go to [email or web address].    Your next appointment:   3 week(s)  The format for your next appointment:   In Person  Provider:   Buford Dresser, MD   Other Instructions

## 2021-01-12 NOTE — Progress Notes (Signed)
Left patient a VM regarding COVID testing date change from 01/16/21 to 01/17/21 due to possible inclement weather. 

## 2021-01-13 DIAGNOSIS — I480 Paroxysmal atrial fibrillation: Secondary | ICD-10-CM | POA: Diagnosis not present

## 2021-01-13 DIAGNOSIS — I4819 Other persistent atrial fibrillation: Secondary | ICD-10-CM | POA: Diagnosis not present

## 2021-01-13 DIAGNOSIS — R0602 Shortness of breath: Secondary | ICD-10-CM | POA: Diagnosis not present

## 2021-01-14 LAB — CBC
Hematocrit: 47.4 % (ref 37.5–51.0)
Hemoglobin: 16 g/dL (ref 13.0–17.7)
MCH: 31 pg (ref 26.6–33.0)
MCHC: 33.8 g/dL (ref 31.5–35.7)
MCV: 92 fL (ref 79–97)
Platelets: 212 10*3/uL (ref 150–450)
RBC: 5.16 x10E6/uL (ref 4.14–5.80)
RDW: 12.6 % (ref 11.6–15.4)
WBC: 7 10*3/uL (ref 3.4–10.8)

## 2021-01-14 LAB — BASIC METABOLIC PANEL
BUN/Creatinine Ratio: 29 — ABNORMAL HIGH (ref 10–24)
BUN: 26 mg/dL (ref 8–27)
CO2: 24 mmol/L (ref 20–29)
Calcium: 9.7 mg/dL (ref 8.6–10.2)
Chloride: 101 mmol/L (ref 96–106)
Creatinine, Ser: 0.91 mg/dL (ref 0.76–1.27)
GFR calc Af Amer: 92 mL/min/{1.73_m2} (ref >59)
GFR calc non Af Amer: 80 mL/min/{1.73_m2} (ref >59)
Glucose: 321 mg/dL — ABNORMAL HIGH (ref 65–99)
Potassium: 4.6 mmol/L (ref 3.5–5.2)
Sodium: 137 mmol/L (ref 134–144)

## 2021-01-16 ENCOUNTER — Other Ambulatory Visit (HOSPITAL_COMMUNITY): Payer: Medicare Other

## 2021-01-17 ENCOUNTER — Other Ambulatory Visit (HOSPITAL_COMMUNITY)
Admission: RE | Admit: 2021-01-17 | Discharge: 2021-01-17 | Disposition: A | Discharge status: Home / Self Care | Payer: Medicare Other | Source: Ambulatory Visit | Attending: Cardiology | Admitting: Cardiology

## 2021-01-17 DIAGNOSIS — Z01818 Encounter for other preprocedural examination: Secondary | ICD-10-CM | POA: Diagnosis not present

## 2021-01-17 DIAGNOSIS — Z20822 Contact with and (suspected) exposure to covid-19: Secondary | ICD-10-CM | POA: Insufficient documentation

## 2021-01-17 LAB — SARS CORONAVIRUS 2 (TAT 6-24 HRS): SARS Coronavirus 2: NEGATIVE

## 2021-01-19 ENCOUNTER — Ambulatory Visit (HOSPITAL_COMMUNITY)
Admission: RE | Admit: 2021-01-19 | Discharge: 2021-01-19 | Disposition: A | Discharge status: Home / Self Care | Payer: Medicare Other | Attending: Cardiology | Admitting: Cardiology

## 2021-01-19 ENCOUNTER — Ambulatory Visit (HOSPITAL_COMMUNITY): Payer: Medicare Other | Admitting: Anesthesiology

## 2021-01-19 ENCOUNTER — Other Ambulatory Visit: Payer: Self-pay

## 2021-01-19 ENCOUNTER — Encounter (HOSPITAL_COMMUNITY)
Admission: RE | Disposition: A | Discharge status: Home / Self Care | Payer: Self-pay | Source: Home / Self Care | Attending: Cardiology

## 2021-01-19 ENCOUNTER — Encounter (HOSPITAL_COMMUNITY): Payer: Self-pay | Admitting: Cardiology

## 2021-01-19 DIAGNOSIS — R5383 Other fatigue: Secondary | ICD-10-CM | POA: Insufficient documentation

## 2021-01-19 DIAGNOSIS — R0602 Shortness of breath: Secondary | ICD-10-CM | POA: Insufficient documentation

## 2021-01-19 DIAGNOSIS — E119 Type 2 diabetes mellitus without complications: Secondary | ICD-10-CM | POA: Diagnosis not present

## 2021-01-19 DIAGNOSIS — I4819 Other persistent atrial fibrillation: Secondary | ICD-10-CM | POA: Diagnosis not present

## 2021-01-19 DIAGNOSIS — Z880 Allergy status to penicillin: Secondary | ICD-10-CM | POA: Insufficient documentation

## 2021-01-19 DIAGNOSIS — I10 Essential (primary) hypertension: Secondary | ICD-10-CM | POA: Insufficient documentation

## 2021-01-19 DIAGNOSIS — Z7984 Long term (current) use of oral hypoglycemic drugs: Secondary | ICD-10-CM | POA: Diagnosis not present

## 2021-01-19 DIAGNOSIS — Z7901 Long term (current) use of anticoagulants: Secondary | ICD-10-CM | POA: Insufficient documentation

## 2021-01-19 DIAGNOSIS — Z87891 Personal history of nicotine dependence: Secondary | ICD-10-CM | POA: Diagnosis not present

## 2021-01-19 DIAGNOSIS — Z79899 Other long term (current) drug therapy: Secondary | ICD-10-CM | POA: Diagnosis not present

## 2021-01-19 DIAGNOSIS — E785 Hyperlipidemia, unspecified: Secondary | ICD-10-CM | POA: Insufficient documentation

## 2021-01-19 DIAGNOSIS — I480 Paroxysmal atrial fibrillation: Secondary | ICD-10-CM | POA: Diagnosis not present

## 2021-01-19 HISTORY — PX: CARDIOVERSION: SHX1299

## 2021-01-19 LAB — POCT I-STAT, CHEM 8
BUN: 23 mg/dL (ref 8–23)
Calcium, Ion: 1.22 mmol/L (ref 1.15–1.40)
Chloride: 103 mmol/L (ref 98–111)
Creatinine, Ser: 0.7 mg/dL (ref 0.61–1.24)
Glucose, Bld: 150 mg/dL — ABNORMAL HIGH (ref 70–99)
HCT: 46 % (ref 39.0–52.0)
Hemoglobin: 15.6 g/dL (ref 13.0–17.0)
Potassium: 4.1 mmol/L (ref 3.5–5.1)
Sodium: 138 mmol/L (ref 135–145)
TCO2: 28 mmol/L (ref 22–32)

## 2021-01-19 SURGERY — CARDIOVERSION
Anesthesia: General

## 2021-01-19 MED ORDER — PROPOFOL 10 MG/ML IV BOLUS
INTRAVENOUS | Status: DC | PRN
  Administered 2021-01-19: 60 mg via INTRAVENOUS

## 2021-01-19 MED ORDER — LIDOCAINE HCL (CARDIAC) PF 100 MG/5ML IV SOSY
PREFILLED_SYRINGE | INTRAVENOUS | Status: DC | PRN
  Administered 2021-01-19: 60 mg via INTRATRACHEAL

## 2021-01-19 MED ORDER — SODIUM CHLORIDE 0.9 % IV SOLN: INTRAVENOUS | Status: DC | PRN

## 2021-01-19 NOTE — Transfer of Care (Signed)
Immediate Anesthesia Transfer of Care Note  Patient: Benjamin Fuller  Procedure(s) Performed: CARDIOVERSION (N/A )  Patient Location: Endoscopy Unit  Anesthesia Type:General  Level of Consciousness: awake, alert  and oriented  Airway & Oxygen Therapy: Patient Spontanous Breathing and Patient connected to nasal cannula oxygen  Post-op Assessment: Report given to RN, Post -op Vital signs reviewed and stable and Patient moving all extremities  Post vital signs: Reviewed and stable  Last Vitals:  Vitals Value Taken Time  BP    Temp    Pulse    Resp    SpO2      Last Pain:  Vitals:   01/19/21 1049  TempSrc: Oral  PainSc: 2          Complications: No complications documented.

## 2021-01-19 NOTE — Interval H&P Note (Signed)
History and Physical Interval Note:  01/19/2021 11:07 AM  Benjamin Fuller  has presented today for surgery, with the diagnosis of A-FIB.  The various methods of treatment have been discussed with the patient and family. After consideration of risks, benefits and other options for treatment, the patient has consented to  Procedure(s): CARDIOVERSION (N/A) as a surgical intervention.  The patient's history has been reviewed, patient examined, no change in status, stable for surgery.  I have reviewed the patient's chart and labs.  Questions were answered to the patient's satisfaction.     [employer]

## 2021-01-19 NOTE — Discharge Instructions (Signed)
Electrical Cardioversion Electrical cardioversion is the delivery of a jolt of electricity to restore a normal rhythm to the heart. A rhythm that is too fast or is not regular keeps the heart from pumping well. In this procedure, sticky patches or metal paddles are placed on the chest to deliver electricity to the heart from a device. This procedure may be done in an emergency if:  There is low or no blood pressure as a result of the heart rhythm.  Normal rhythm must be restored as fast as possible to protect the brain and heart from further damage.  It may save a life. This may also be a scheduled procedure for irregular or fast heart rhythms that are not immediately life-threatening. Tell a health care provider about:  Any allergies you have.  All medicines you are taking, including vitamins, herbs, eye drops, creams, and over-the-counter medicines.  Any problems you or family members have had with anesthetic medicines.  Any blood disorders you have.  Any surgeries you have had.  Any medical conditions you have.  Whether you are pregnant or may be pregnant. What are the risks? Generally, this is a safe procedure. However, problems may occur, including:  Allergic reactions to medicines.  A blood clot that breaks free and travels to other parts of your body.  The possible return of an abnormal heart rhythm within hours or days after the procedure.  Your heart stopping (cardiac arrest). This is rare. What happens before the procedure? Medicines  Your health care provider may have you start taking: ? Blood-thinning medicines (anticoagulants) so your blood does not clot as easily. ? Medicines to help stabilize your heart rate and rhythm.  Ask your health care provider about: ? Changing or stopping your regular medicines. This is especially important if you are taking diabetes medicines or blood thinners. ? Taking medicines such as aspirin and ibuprofen. These medicines can  thin your blood. Do not take these medicines unless your health care provider tells you to take them. ? Taking over-the-counter medicines, vitamins, herbs, and supplements. General instructions  Follow instructions from your health care provider about eating or drinking restrictions.  Plan to have someone take you home from the hospital or clinic.  If you will be going home right after the procedure, plan to have someone with you for 24 hours.  Ask your health care provider what steps will be taken to help prevent infection. These may include washing your skin with a germ-killing soap. What happens during the procedure?  An IV will be inserted into one of your veins.  Sticky patches (electrodes) or metal paddles may be placed on your chest.  You will be given a medicine to help you relax (sedative).  An electrical shock will be delivered. The procedure may vary among health care providers and hospitals.   What can I expect after the procedure?  Your blood pressure, heart rate, breathing rate, and blood oxygen level will be monitored until you leave the hospital or clinic.  Your heart rhythm will be watched to make sure it does not change.  You may have some redness on the skin where the shocks were given. Follow these instructions at home:  Do not drive for 24 hours if you were given a sedative during your procedure.  Take over-the-counter and prescription medicines only as told by your health care provider.  Ask your health care provider how to check your pulse. Check it often.  Rest for 48 hours after the procedure   or as told by your health care provider.  Avoid or limit your caffeine use as told by your health care provider.  Keep all follow-up visits as told by your health care provider. This is important. Contact a health care provider if:  You feel like your heart is beating too quickly or your pulse is not regular.  You have a serious muscle cramp that does not go  away. Get help right away if:  You have discomfort in your chest.  You are dizzy or you feel faint.  You have trouble breathing or you are short of breath.  Your speech is slurred.  You have trouble moving an arm or leg on one side of your body.  Your fingers or toes turn cold or blue. Summary  Electrical cardioversion is the delivery of a jolt of electricity to restore a normal rhythm to the heart.  This procedure may be done right away in an emergency or may be a scheduled procedure if the condition is not an emergency.  Generally, this is a safe procedure.  After the procedure, check your pulse often as told by your health care provider. This information is not intended to replace advice given to you by your health care provider. Make sure you discuss any questions you have with your health care provider. Document Revised: 07/20/2019 Document Reviewed: 07/20/2019 Elsevier Patient Education  2021 Elsevier Inc.  

## 2021-01-19 NOTE — Anesthesia Postprocedure Evaluation (Signed)
Anesthesia Post Note  Patient: Benjamin Fuller  Procedure(s) Performed: CARDIOVERSION (N/A )     Patient location during evaluation: PACU Anesthesia Type: General Level of consciousness: awake and alert, oriented and patient cooperative Pain management: pain level controlled Vital Signs Assessment: post-procedure vital signs reviewed and stable Respiratory status: spontaneous breathing, nonlabored ventilation and respiratory function stable Cardiovascular status: blood pressure returned to baseline and stable Postop Assessment: no apparent nausea or vomiting Anesthetic complications: no   No complications documented.  Last Vitals:  Vitals:   01/19/21 1121 01/19/21 1131  BP: 121/84 118/69  Pulse: 72 72  Resp: 20 17  Temp: 36.7 C   SpO2: 98% 98%    Last Pain:  Vitals:   01/19/21 1131  TempSrc:   PainSc: 0-No pain                 Pervis Hocking

## 2021-01-19 NOTE — CV Procedure (Signed)
Electrical Cardioversion Procedure Note Benjamin Fuller [id number] 1941-01-20  Procedure: Electrical Cardioversion Indications:  Atrial Fibrillation  Time Out: Verified patient identification, verified procedure,medications/allergies/relevent history reviewed, required imaging and test results available.  Performed  Procedure Details  The patient was NPO after midnight. Anesthesia was administered at the beside  by Dr. Salvadore Farber with propofol.  Cardioversion was performed with synchronized biphasic defibrillation via AP pads with 200 joules.  1 attempt(s) were performed.  The patient converted to normal sinus rhythm. The patient tolerated the procedure well   IMPRESSION:  Successful cardioversion of atrial fibrillation    Donato Schultz 01/19/2021, 11:16 AM

## 2021-01-19 NOTE — Anesthesia Preprocedure Evaluation (Addendum)
Anesthesia Evaluation  Patient identified by MRN, date of birth, ID band Patient awake    Reviewed: Allergy & Precautions, NPO status , Patient's Chart, lab work & pertinent test results, reviewed documented beta blocker date and time   Airway Mallampati: III  TM Distance: >3 FB Neck ROM: Full  Mouth opening: Limited Mouth Opening  Dental  (+) Teeth Intact, Dental Advisory Given   Pulmonary former smoker,    Pulmonary exam normal breath sounds clear to auscultation       Cardiovascular hypertension, Pt. on medications and Pt. on home beta blockers Normal cardiovascular exam+ dysrhythmias (eliquis) Atrial Fibrillation  Rhythm:Regular Rate:Normal  Echo 01/2020: 1. Left ventricular ejection fraction, by visual estimation, is 55 to  60%. The left ventricle has normal function. There is mildly increased  left ventricular hypertrophy.  2. Left ventricular diastolic function could not be evaluated.  3. The left ventricle has no regional wall motion abnormalities.  4. Global right ventricle has normal systolic function.The right  ventricular size is normal. No increase in right ventricular wall  thickness.  5. Left atrial size was normal.  6. Right atrial size was normal.  7. The mitral valve is normal in structure. Trivial mitral valve  regurgitation.  8. The tricuspid valve is normal in structure.  9. The aortic valve is unicuspid. Aortic valve regurgitation is not  visualized. No evidence of aortic valve sclerosis or stenosis.  10. Pulmonic regurgitation is mild.  11. The pulmonic valve was grossly normal. Pulmonic valve regurgitation is  mild.  12. Aortic dilatation noted.  13. There is mild dilatation of the ascending aorta measuring 39 mm.  14. Normal pulmonary artery systolic pressure.  15. The inferior vena cava is normal in size with greater than 50%  respiratory variability, suggesting right atrial pressure of 3  mmHg.    Neuro/Psych negative neurological ROS  negative psych ROS   GI/Hepatic negative GI ROS, Neg liver ROS,   Endo/Other  diabetes, Well Controlled, Type 2, Oral Hypoglycemic Agents  Renal/GU negative Renal ROS  negative genitourinary   Musculoskeletal  (+) Arthritis , Osteoarthritis,    Abdominal   Peds  Hematology  (+) Blood dyscrasia, anemia ,   Anesthesia Other Findings   Reproductive/Obstetrics negative OB ROS                            Anesthesia Physical Anesthesia Plan  ASA: III  Anesthesia Plan: General   Post-op Pain Management:    Induction: Intravenous  PONV Risk Score and Plan: TIVA and Treatment may vary due to age or medical condition  Airway Management Planned: Natural Airway and Mask  Additional Equipment: None  Intra-op Plan:   Post-operative Plan:   Informed Consent: I have reviewed the patients History and Physical, chart, labs and discussed the procedure including the risks, benefits and alternatives for the proposed anesthesia with the patient or authorized representative who has indicated his/her understanding and acceptance.       Plan Discussed with: CRNA  Anesthesia Plan Comments:        Anesthesia Quick Evaluation

## 2021-01-19 NOTE — Anesthesia Procedure Notes (Signed)
Procedure Name: General with mask airway Date/Time: 01/19/2021 11:12 AM Performed by: Amadeo Garnet, CRNA Pre-anesthesia Checklist: Patient identified, Suction available, Emergency Drugs available and Patient being monitored Patient Re-evaluated:Patient Re-evaluated prior to induction Oxygen Delivery Method: Ambu bag Preoxygenation: Pre-oxygenation with 100% oxygen Induction Type: IV induction Placement Confirmation: positive ETCO2 Dental Injury: Teeth and Oropharynx as per pre-operative assessment

## 2021-01-22 ENCOUNTER — Encounter (HOSPITAL_COMMUNITY): Payer: Self-pay | Admitting: Cardiology

## 2021-01-27 DIAGNOSIS — Z961 Presence of intraocular lens: Secondary | ICD-10-CM | POA: Diagnosis not present

## 2021-01-27 DIAGNOSIS — E119 Type 2 diabetes mellitus without complications: Secondary | ICD-10-CM | POA: Diagnosis not present

## 2021-02-08 ENCOUNTER — Ambulatory Visit (INDEPENDENT_AMBULATORY_CARE_PROVIDER_SITE_OTHER): Payer: Medicare Other | Admitting: Cardiology

## 2021-02-08 ENCOUNTER — Other Ambulatory Visit: Payer: Self-pay

## 2021-02-08 ENCOUNTER — Encounter: Payer: Self-pay | Admitting: Cardiology

## 2021-02-08 VITALS — BP 102/60 | HR 72 | Ht 66.0 in | Wt 175.8 lb

## 2021-02-08 DIAGNOSIS — Z7901 Long term (current) use of anticoagulants: Secondary | ICD-10-CM

## 2021-02-08 DIAGNOSIS — I10 Essential (primary) hypertension: Secondary | ICD-10-CM | POA: Diagnosis not present

## 2021-02-08 DIAGNOSIS — R0602 Shortness of breath: Secondary | ICD-10-CM

## 2021-02-08 DIAGNOSIS — I4819 Other persistent atrial fibrillation: Secondary | ICD-10-CM

## 2021-02-08 DIAGNOSIS — E119 Type 2 diabetes mellitus without complications: Secondary | ICD-10-CM

## 2021-02-08 MED ORDER — APIXABAN 5 MG PO TABS: 5.0000 mg | ORAL_TABLET | Freq: Two times a day (BID) | ORAL | 3 refills | Status: DC

## 2021-02-08 NOTE — Patient Instructions (Signed)
Medication Instructions:  Your Physician recommend you continue on your current medication as directed.    *If you need a refill on your cardiac medications before your next appointment, please call your pharmacy*   Lab Work: None   Testing/Procedures: Your physician has requested that you have an echocardiogram. Echocardiography is a painless test that uses sound waves to create images of your heart. It provides your doctor with information about the size and shape of your heart and how well your heart's chambers and valves are working. This procedure takes approximately one hour. There are no restrictions for this procedure. [address]    Follow-Up: At [facility], you and your health needs are our priority.  As part of our continuing mission to provide you with exceptional heart care, we have created designated Provider Care Teams.  These Care Teams include your primary Cardiologist (physician) and Advanced Practice Providers (APPs -  Physician Assistants and Nurse Practitioners) who all work together to provide you with the care you need, when you need it.  We recommend signing up for the patient portal called "MyChart".  Sign up information is provided on this After Visit Summary.  MyChart is used to connect with patients for Virtual Visits (Telemedicine).  Patients are able to view lab/test results, encounter notes, upcoming appointments, etc.  Non-urgent messages can be sent to your provider as well.   To learn more about what you can do with MyChart, go to [email or web address].    Your next appointment:   6 week(s)  The format for your next appointment:   In Person  Provider:   Buford Dresser, MD
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
## 2021-02-10 ENCOUNTER — Ambulatory Visit: Payer: Medicare Other | Admitting: Cardiology

## 2021-02-19 ENCOUNTER — Encounter: Payer: Self-pay | Admitting: Cardiology

## 2021-02-21 DIAGNOSIS — I4891 Unspecified atrial fibrillation: Secondary | ICD-10-CM | POA: Diagnosis not present

## 2021-02-21 DIAGNOSIS — E119 Type 2 diabetes mellitus without complications: Secondary | ICD-10-CM | POA: Diagnosis not present

## 2021-02-21 DIAGNOSIS — I10 Essential (primary) hypertension: Secondary | ICD-10-CM | POA: Diagnosis not present

## 2021-02-21 DIAGNOSIS — E782 Mixed hyperlipidemia: Secondary | ICD-10-CM | POA: Diagnosis not present

## 2021-02-21 DIAGNOSIS — N400 Enlarged prostate without lower urinary tract symptoms: Secondary | ICD-10-CM | POA: Diagnosis not present

## 2021-03-02 ENCOUNTER — Other Ambulatory Visit: Payer: Self-pay

## 2021-03-02 ENCOUNTER — Ambulatory Visit (HOSPITAL_COMMUNITY): Payer: Medicare Other | Attending: Cardiology

## 2021-03-02 DIAGNOSIS — R0602 Shortness of breath: Secondary | ICD-10-CM | POA: Insufficient documentation

## 2021-03-02 LAB — ECHOCARDIOGRAM COMPLETE
Area-P 1/2: 2.89 cm2
MV M vel: 4.18 m/s
MV Peak grad: 69.9 mmHg
Radius: 0.45 cm
S' Lateral: 2.3 cm

## 2021-03-06 ENCOUNTER — Ambulatory Visit (INDEPENDENT_AMBULATORY_CARE_PROVIDER_SITE_OTHER): Payer: Medicare Other | Admitting: Internal Medicine

## 2021-03-06 ENCOUNTER — Encounter: Payer: Self-pay | Admitting: Internal Medicine

## 2021-03-06 ENCOUNTER — Other Ambulatory Visit: Payer: Self-pay

## 2021-03-06 VITALS — BP 112/62 | HR 71 | Ht 66.0 in | Wt 176.8 lb

## 2021-03-06 DIAGNOSIS — I10 Essential (primary) hypertension: Secondary | ICD-10-CM | POA: Diagnosis not present

## 2021-03-06 DIAGNOSIS — I4891 Unspecified atrial fibrillation: Secondary | ICD-10-CM | POA: Diagnosis not present

## 2021-03-06 DIAGNOSIS — I480 Paroxysmal atrial fibrillation: Secondary | ICD-10-CM

## 2021-03-06 DIAGNOSIS — I4819 Other persistent atrial fibrillation: Secondary | ICD-10-CM | POA: Diagnosis not present

## 2021-03-06 LAB — BASIC METABOLIC PANEL
BUN/Creatinine Ratio: 26 — ABNORMAL HIGH (ref 10–24)
BUN: 18 mg/dL (ref 8–27)
CO2: 23 mmol/L (ref 20–29)
Calcium: 9.2 mg/dL (ref 8.6–10.2)
Chloride: 105 mmol/L (ref 96–106)
Creatinine, Ser: 0.7 mg/dL — ABNORMAL LOW (ref 0.76–1.27)
Glucose: 132 mg/dL — ABNORMAL HIGH (ref 65–99)
Potassium: 4.1 mmol/L (ref 3.5–5.2)
Sodium: 139 mmol/L (ref 134–144)
eGFR: 94 mL/min/{1.73_m2} (ref >59)

## 2021-03-06 LAB — CBC WITH DIFFERENTIAL/PLATELET
Basophils Absolute: 0 10*3/uL (ref 0.0–0.2)
Basos: 0 %
EOS (ABSOLUTE): 0.1 10*3/uL (ref 0.0–0.4)
Eos: 1 %
Hematocrit: 41.1 % (ref 37.5–51.0)
Hemoglobin: 13.9 g/dL (ref 13.0–17.7)
Lymphocytes Absolute: 1.3 10*3/uL (ref 0.7–3.1)
Lymphs: 20 %
MCH: 30.4 pg (ref 26.6–33.0)
MCHC: 33.8 g/dL (ref 31.5–35.7)
MCV: 90 fL (ref 79–97)
Monocytes Absolute: 0.6 10*3/uL (ref 0.1–0.9)
Monocytes: 9 %
Neutrophils Absolute: 4.4 10*3/uL (ref 1.4–7.0)
Neutrophils: 70 %
Platelets: 149 10*3/uL — ABNORMAL LOW (ref 150–450)
RBC: 4.57 x10E6/uL (ref 4.14–5.80)
RDW: 13.6 % (ref 11.6–15.4)
WBC: 6.4 10*3/uL (ref 3.4–10.8)
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
## 2021-03-06 NOTE — Patient Instructions (Addendum)
Medication Instructions:  Your physician recommends that you continue on your current medications as directed. Please refer to the Current Medication list given to you today.  Labwork: You will get lab work today:  BMP and CBC  Testing/Procedures: None ordered.  Follow-Up:  SEE INSTRUCTION LETTER  Any Other Special Instructions Will Be Listed Below (If Applicable).  If you need a refill on your cardiac medications before your next appointment, please call your pharmacy.    Cardiac electrophysiology: From cell to bedside (7th ed., pp. [phone]). [place], [place]: Elsevier.">  Cardiac Ablation Cardiac ablation is a procedure to destroy, or ablate, a small amount of heart tissue in very specific places. The heart has many electrical connections. Sometimes these connections are abnormal and can cause the heart to beat very fast or irregularly. Ablating some of the areas that cause problems can improve the heart's rhythm or return it to normal. Ablation may be done for people who:  Have Wolff-Parkinson-White syndrome.  Have fast heart rhythms (tachycardia).  Have taken medicines for an abnormal heart rhythm (arrhythmia) that were not effective or caused side effects.  Have a high-risk heartbeat that may be life-threatening. During the procedure, a small incision is made in the neck or the groin, and a long, thin tube (catheter) is inserted into the incision and moved to the heart. Small devices (electrodes) on the tip of the catheter will send out electrical currents. A type of X-ray (fluoroscopy) will be used to help guide the catheter and to provide images of the heart. Tell a health care provider about:  Any allergies you have.  All medicines you are taking, including vitamins, herbs, eye drops, creams, and over-the-counter medicines.  Any problems you or family members have had with anesthetic medicines.  Any blood disorders you have.  Any surgeries you have had.  Any  medical conditions you have, such as kidney failure.  Whether you are pregnant or may be pregnant. What are the risks? Generally, this is a safe procedure. However, problems may occur, including:  Infection.  Bruising and bleeding at the catheter insertion site.  Bleeding into the chest, especially into the sac that surrounds the heart. This is a serious complication.  Stroke or blood clots.  Damage to nearby structures or organs.  Allergic reaction to medicines or dyes.  Need for a permanent pacemaker if the normal electrical system is damaged. A pacemaker is a small computer that sends electrical signals to the heart and helps your heart beat normally.  The procedure not being fully effective. This may not be recognized until months later. Repeat ablation procedures are sometimes done. What happens before the procedure? Medicines Ask your health care provider about:  Changing or stopping your regular medicines. This is especially important if you are taking diabetes medicines or blood thinners.  Taking medicines such as aspirin and ibuprofen. These medicines can thin your blood. Do not take these medicines unless your health care provider tells you to take them.  Taking over-the-counter medicines, vitamins, herbs, and supplements. General instructions  Follow instructions from your health care provider about eating or drinking restrictions.  Plan to have someone take you home from the hospital or clinic.  If you will be going home right after the procedure, plan to have someone with you for 24 hours.  Ask your health care provider what steps will be taken to prevent infection. What happens during the procedure?  An IV will be inserted into one of your veins.  You will be  given a medicine to help you relax (sedative).  The skin on your neck or groin will be numbed.  An incision will be made in your neck or your groin.  A needle will be inserted through the incision  and into a large vein in your neck or groin.  A catheter will be inserted into the needle and moved to your heart.  Dye may be injected through the catheter to help your surgeon see the area of the heart that needs treatment.  Electrical currents will be sent from the catheter to ablate heart tissue in desired areas. There are three types of energy that may be used to do this: ? Heat (radiofrequency energy). ? Laser energy. ? Extreme cold (cryoablation).  When the tissue has been ablated, the catheter will be removed.  Pressure will be held on the insertion area to prevent a lot of bleeding.  A bandage (dressing) will be placed over the insertion area. The exact procedure may vary among health care providers and hospitals.   What happens after the procedure?  Your blood pressure, heart rate, breathing rate, and blood oxygen level will be monitored until you leave the hospital or clinic.  Your insertion area will be monitored for bleeding. You will need to lie still for a few hours to ensure that you do not bleed from the insertion area.  Do not drive for 24 hours or as long as told by your health care provider. Summary  Cardiac ablation is a procedure to destroy, or ablate, a small amount of heart tissue using an electrical current. This procedure can improve the heart rhythm or return it to normal.  Tell your health care provider about any medical conditions you may have and all medicines you are taking to treat them.  This is a safe procedure, but problems may occur. Problems may include infection, bruising, damage to nearby organs or structures, or allergic reactions to medicines.  Follow your health care provider's instructions about eating and drinking before the procedure. You may also be told to change or stop some of your medicines.  After the procedure, do not drive for 24 hours or as long as told by your health care provider. This information is not intended to replace  advice given to you by your health care provider. Make sure you discuss any questions you have with your health care provider. Document Revised: 10/26/2019 Document Reviewed: 10/26/2019 Elsevier Patient Education  Advance.

## 2021-03-20 DIAGNOSIS — I4891 Unspecified atrial fibrillation: Secondary | ICD-10-CM | POA: Diagnosis not present

## 2021-03-20 DIAGNOSIS — I10 Essential (primary) hypertension: Secondary | ICD-10-CM | POA: Diagnosis not present

## 2021-03-20 DIAGNOSIS — E119 Type 2 diabetes mellitus without complications: Secondary | ICD-10-CM | POA: Diagnosis not present

## 2021-03-20 DIAGNOSIS — E782 Mixed hyperlipidemia: Secondary | ICD-10-CM | POA: Diagnosis not present

## 2021-03-20 DIAGNOSIS — N400 Enlarged prostate without lower urinary tract symptoms: Secondary | ICD-10-CM | POA: Diagnosis not present

## 2021-03-23 ENCOUNTER — Telehealth (HOSPITAL_COMMUNITY): Payer: Self-pay | Admitting: *Deleted

## 2021-03-23 ENCOUNTER — Telehealth (HOSPITAL_COMMUNITY): Payer: Self-pay | Admitting: Emergency Medicine

## 2021-03-23 NOTE — Telephone Encounter (Signed)
Attempted to call patient regarding upcoming cardiac CT appointment. °Left message on voicemail with name and callback number °Lakelynn Severtson RN Navigator Cardiac Imaging °[facility] °[phone] Office °[phone] Cell ° °

## 2021-03-23 NOTE — Telephone Encounter (Signed)
Pt returning call regarding upcoming cardiac imaging study; pt verbalizes understanding of appt date/time, parking situation and where to check in, pre-test NPO status, and verified current allergies; name and call back number provided for further questions should they arise  Gordy Clement RN Navigator Cardiac Imaging [facility] [phone] office [phone] cell

## 2021-03-24 ENCOUNTER — Ambulatory Visit (HOSPITAL_COMMUNITY)
Admission: RE | Admit: 2021-03-24 | Discharge: 2021-03-24 | Disposition: A | Discharge status: Home / Self Care | Payer: Medicare Other | Source: Ambulatory Visit | Attending: Internal Medicine | Admitting: Internal Medicine

## 2021-03-24 ENCOUNTER — Other Ambulatory Visit: Payer: Self-pay

## 2021-03-24 DIAGNOSIS — I4891 Unspecified atrial fibrillation: Secondary | ICD-10-CM

## 2021-03-24 MED ORDER — IOHEXOL 350 MG/ML SOLN
80.0000 mL | Freq: Once | INTRAVENOUS | Status: AC | PRN
  Administered 2021-03-24: 80 mL via INTRAVENOUS

## 2021-03-27 ENCOUNTER — Ambulatory Visit: Payer: Medicare Other | Admitting: Cardiology

## 2021-03-28 ENCOUNTER — Other Ambulatory Visit (HOSPITAL_COMMUNITY)
Admission: RE | Admit: 2021-03-28 | Discharge: 2021-03-28 | Disposition: A | Discharge status: Home / Self Care | Payer: Medicare Other | Source: Ambulatory Visit | Attending: Internal Medicine | Admitting: Internal Medicine

## 2021-03-28 DIAGNOSIS — Z01812 Encounter for preprocedural laboratory examination: Secondary | ICD-10-CM | POA: Diagnosis not present

## 2021-03-28 DIAGNOSIS — Z20822 Contact with and (suspected) exposure to covid-19: Secondary | ICD-10-CM | POA: Diagnosis not present

## 2021-03-28 LAB — SARS CORONAVIRUS 2 (TAT 6-24 HRS): SARS Coronavirus 2: NEGATIVE

## 2021-03-30 ENCOUNTER — Ambulatory Visit (HOSPITAL_COMMUNITY)
Admission: RE | Admit: 2021-03-30 | Discharge: 2021-03-30 | Disposition: A | Discharge status: Home / Self Care | Payer: Medicare Other | Source: Ambulatory Visit | Attending: Internal Medicine | Admitting: Internal Medicine

## 2021-03-30 ENCOUNTER — Ambulatory Visit (HOSPITAL_COMMUNITY): Payer: Medicare Other | Admitting: Anesthesiology

## 2021-03-30 ENCOUNTER — Encounter (HOSPITAL_COMMUNITY)
Admission: RE | Disposition: A | Discharge status: Home / Self Care | Payer: Self-pay | Source: Ambulatory Visit | Attending: Internal Medicine

## 2021-03-30 ENCOUNTER — Other Ambulatory Visit: Payer: Self-pay

## 2021-03-30 DIAGNOSIS — Z87891 Personal history of nicotine dependence: Secondary | ICD-10-CM | POA: Diagnosis not present

## 2021-03-30 DIAGNOSIS — I480 Paroxysmal atrial fibrillation: Secondary | ICD-10-CM | POA: Diagnosis not present

## 2021-03-30 DIAGNOSIS — I10 Essential (primary) hypertension: Secondary | ICD-10-CM | POA: Insufficient documentation

## 2021-03-30 DIAGNOSIS — I447 Left bundle-branch block, unspecified: Secondary | ICD-10-CM | POA: Insufficient documentation

## 2021-03-30 DIAGNOSIS — E1136 Type 2 diabetes mellitus with diabetic cataract: Secondary | ICD-10-CM | POA: Insufficient documentation

## 2021-03-30 DIAGNOSIS — E785 Hyperlipidemia, unspecified: Secondary | ICD-10-CM | POA: Insufficient documentation

## 2021-03-30 DIAGNOSIS — Z79899 Other long term (current) drug therapy: Secondary | ICD-10-CM | POA: Insufficient documentation

## 2021-03-30 DIAGNOSIS — Z7901 Long term (current) use of anticoagulants: Secondary | ICD-10-CM | POA: Diagnosis not present

## 2021-03-30 DIAGNOSIS — Z89011 Acquired absence of right thumb: Secondary | ICD-10-CM | POA: Insufficient documentation

## 2021-03-30 DIAGNOSIS — Z880 Allergy status to penicillin: Secondary | ICD-10-CM | POA: Diagnosis not present

## 2021-03-30 DIAGNOSIS — Z7984 Long term (current) use of oral hypoglycemic drugs: Secondary | ICD-10-CM | POA: Insufficient documentation

## 2021-03-30 DIAGNOSIS — I4819 Other persistent atrial fibrillation: Secondary | ICD-10-CM | POA: Diagnosis not present

## 2021-03-30 DIAGNOSIS — E119 Type 2 diabetes mellitus without complications: Secondary | ICD-10-CM | POA: Diagnosis not present

## 2021-03-30 HISTORY — PX: ATRIAL FIBRILLATION ABLATION: EP1191

## 2021-03-30 LAB — POCT ACTIVATED CLOTTING TIME
Activated Clotting Time: 303 seconds
Activated Clotting Time: 327 seconds

## 2021-03-30 LAB — GLUCOSE, CAPILLARY
Glucose-Capillary: 143 mg/dL — ABNORMAL HIGH (ref 70–99)
Glucose-Capillary: 162 mg/dL — ABNORMAL HIGH (ref 70–99)

## 2021-03-30 SURGERY — ATRIAL FIBRILLATION ABLATION
Anesthesia: General

## 2021-03-30 MED ORDER — OXYCODONE HCL 5 MG/5ML PO SOLN: 5.0000 mg | Freq: Once | ORAL | Status: DC | PRN

## 2021-03-30 MED ORDER — FENTANYL CITRATE (PF) 100 MCG/2ML IJ SOLN: 25.0000 ug | INTRAMUSCULAR | Status: DC | PRN

## 2021-03-30 MED ORDER — PROTAMINE SULFATE 10 MG/ML IV SOLN
INTRAVENOUS | Status: DC | PRN
  Administered 2021-03-30: 40 mg via INTRAVENOUS

## 2021-03-30 MED ORDER — DEXAMETHASONE SODIUM PHOSPHATE 10 MG/ML IJ SOLN
INTRAMUSCULAR | Status: DC | PRN
  Administered 2021-03-30: 5 mg via INTRAVENOUS

## 2021-03-30 MED ORDER — SODIUM CHLORIDE 0.9% FLUSH: 3.0000 mL | INTRAVENOUS | Status: DC | PRN

## 2021-03-30 MED ORDER — APIXABAN 5 MG PO TABS
5.0000 mg | ORAL_TABLET | Freq: Once | ORAL | Status: AC
  Administered 2021-03-30: 5 mg via ORAL
  Filled 2021-03-30: qty 1

## 2021-03-30 MED ORDER — HEPARIN SODIUM (PORCINE) 1000 UNIT/ML IJ SOLN
INTRAMUSCULAR | Status: DC | PRN
  Administered 2021-03-30: 1000 [IU] via INTRAVENOUS

## 2021-03-30 MED ORDER — SODIUM CHLORIDE 0.9 % IV SOLN: 250.0000 mL | INTRAVENOUS | Status: DC | PRN

## 2021-03-30 MED ORDER — ONDANSETRON HCL 4 MG/2ML IJ SOLN
INTRAMUSCULAR | Status: DC | PRN
  Administered 2021-03-30: 4 mg via INTRAVENOUS

## 2021-03-30 MED ORDER — SUGAMMADEX SODIUM 200 MG/2ML IV SOLN
INTRAVENOUS | Status: DC | PRN
  Administered 2021-03-30: 200 mg via INTRAVENOUS

## 2021-03-30 MED ORDER — HEPARIN SODIUM (PORCINE) 1000 UNIT/ML IJ SOLN
INTRAMUSCULAR | Status: DC | PRN
  Administered 2021-03-30: 3000 [IU] via INTRAVENOUS

## 2021-03-30 MED ORDER — HYDROCODONE-ACETAMINOPHEN 5-325 MG PO TABS: 1.0000 | ORAL_TABLET | ORAL | Status: DC | PRN

## 2021-03-30 MED ORDER — PANTOPRAZOLE SODIUM 40 MG PO TBEC: 40.0000 mg | DELAYED_RELEASE_TABLET | Freq: Every day | ORAL | 0 refills | Status: DC

## 2021-03-30 MED ORDER — SODIUM CHLORIDE 0.9 % IV SOLN: INTRAVENOUS | Status: DC

## 2021-03-30 MED ORDER — OXYCODONE HCL 5 MG PO TABS
5.0000 mg | ORAL_TABLET | Freq: Once | ORAL | Status: DC | PRN
Start: 2021-03-30 — End: 2021-03-30

## 2021-03-30 MED ORDER — PROPOFOL 10 MG/ML IV BOLUS
INTRAVENOUS | Status: DC | PRN
  Administered 2021-03-30: 110 mg via INTRAVENOUS

## 2021-03-30 MED ORDER — HEPARIN (PORCINE) IN NACL 1000-0.9 UT/500ML-% IV SOLN
INTRAVENOUS | Status: AC
  Filled 2021-03-30: qty 500

## 2021-03-30 MED ORDER — ONDANSETRON HCL 4 MG/2ML IJ SOLN: 4.0000 mg | Freq: Four times a day (QID) | INTRAMUSCULAR | Status: DC | PRN

## 2021-03-30 MED ORDER — LIDOCAINE 2% (20 MG/ML) 5 ML SYRINGE
INTRAMUSCULAR | Status: DC | PRN
  Administered 2021-03-30: 60 mg via INTRAVENOUS

## 2021-03-30 MED ORDER — PHENYLEPHRINE HCL-NACL 10-0.9 MG/250ML-% IV SOLN
INTRAVENOUS | Status: DC | PRN
  Administered 2021-03-30: 25 ug/min via INTRAVENOUS

## 2021-03-30 MED ORDER — HEPARIN SODIUM (PORCINE) 1000 UNIT/ML IJ SOLN
INTRAMUSCULAR | Status: AC
  Filled 2021-03-30: qty 2

## 2021-03-30 MED ORDER — SODIUM CHLORIDE 0.9% FLUSH: 3.0000 mL | Freq: Two times a day (BID) | INTRAVENOUS | Status: DC

## 2021-03-30 MED ORDER — ROCURONIUM BROMIDE 10 MG/ML (PF) SYRINGE
PREFILLED_SYRINGE | INTRAVENOUS | Status: DC | PRN
  Administered 2021-03-30: 20 mg via INTRAVENOUS
  Administered 2021-03-30: 60 mg via INTRAVENOUS

## 2021-03-30 MED ORDER — HEPARIN (PORCINE) IN NACL 1000-0.9 UT/500ML-% IV SOLN
INTRAVENOUS | Status: DC | PRN
  Administered 2021-03-30: 500 mL

## 2021-03-30 MED ORDER — FENTANYL CITRATE (PF) 250 MCG/5ML IJ SOLN
INTRAMUSCULAR | Status: DC | PRN
  Administered 2021-03-30: 50 ug via INTRAVENOUS
  Administered 2021-03-30: 25 ug via INTRAVENOUS

## 2021-03-30 MED ORDER — ACETAMINOPHEN 325 MG PO TABS: 650.0000 mg | ORAL_TABLET | ORAL | Status: DC | PRN

## 2021-03-30 MED ORDER — ONDANSETRON HCL 4 MG/2ML IJ SOLN: 4.0000 mg | Freq: Once | INTRAMUSCULAR | Status: DC | PRN

## 2021-03-30 SURGICAL SUPPLY — 18 items
BLANKET WARM UNDERBOD FULL ACC (MISCELLANEOUS) ×2 IMPLANT
CATH 8FR REPROCESSED SOUNDSTAR (CATHETERS) ×2 IMPLANT
CATH MAPPNG PENTARAY F 2-6-2MM (CATHETERS) ×1 IMPLANT
CATH SMTCH THERMOCOOL SF DF (CATHETERS) ×2 IMPLANT
CATH WEB BI DIR CSDF CRV REPRO (CATHETERS) ×2 IMPLANT
CLOSURE PERCLOSE PROSTYLE (VASCULAR PRODUCTS) ×6 IMPLANT
COVER SWIFTLINK CONNECTOR (BAG) ×2 IMPLANT
INTRODUCER SWARTZ SL2 8F (SHEATH) ×2 IMPLANT
NEEDLE BAYLIS TRANSSEPTAL 71CM (NEEDLE) ×2 IMPLANT
PACK EP LATEX FREE (CUSTOM PROCEDURE TRAY) ×2
PACK EP LF (CUSTOM PROCEDURE TRAY) ×1 IMPLANT
PAD PRO RADIOLUCENT 2001M-C (PAD) ×2 IMPLANT
PATCH CARTO3 (PAD) ×2 IMPLANT
PENTARAY F 2-6-2MM (CATHETERS) ×2
SHEATH PINNACLE 7F 10CM (SHEATH) ×4 IMPLANT
SHEATH PINNACLE 9F 10CM (SHEATH) ×2 IMPLANT
SHEATH PROBE COVER 6X72 (BAG) ×2 IMPLANT
TUBING SMART ABLATE COOLFLOW (TUBING) ×2 IMPLANT

## 2021-03-30 NOTE — Anesthesia Procedure Notes (Signed)
Procedure Name: Intubation Date/Time: 03/30/2021 9:50 AM Performed by: Kyung Rudd, CRNA Pre-anesthesia Checklist: Patient identified, Emergency Drugs available, Suction available and Patient being monitored Patient Re-evaluated:Patient Re-evaluated prior to induction Oxygen Delivery Method: Circle system utilized Preoxygenation: Pre-oxygenation with 100% oxygen Induction Type: IV induction Ventilation: Mask ventilation without difficulty Laryngoscope Size: Glidescope and 4 Tube type: Oral Tube size: 7.5 mm Number of attempts: 1 Airway Equipment and Method: Stylet Placement Confirmation: ETT inserted through vocal cords under direct vision,  positive ETCO2 and breath sounds checked- equal and bilateral Secured at: 21 cm Tube secured with: Tape Dental Injury: Teeth and Oropharynx as per pre-operative assessment

## 2021-03-30 NOTE — Transfer of Care (Signed)
Immediate Anesthesia Transfer of Care Note  Patient: Benjamin Fuller  Procedure(s) Performed: ATRIAL FIBRILLATION ABLATION (N/A )  Patient Location: Cath Lab  Anesthesia Type:General  Level of Consciousness: awake, alert  and oriented  Airway & Oxygen Therapy: Patient Spontanous Breathing and Patient connected to nasal cannula oxygen  Post-op Assessment: Report given to RN, Post -op Vital signs reviewed and stable and Patient moving all extremities  Post vital signs: Reviewed and stable  Last Vitals:  Vitals Value Taken Time  BP 85/49 03/30/21 1226  Temp 36.5 C 03/30/21 1223  Pulse 79 03/30/21 1227  Resp 20 03/30/21 1227  SpO2 95 % 03/30/21 1227  Vitals shown include unvalidated device data.  Last Pain:  Vitals:   03/30/21 1223  TempSrc: Temporal  PainSc: 0-No pain      Patients Stated Pain Goal: 4 (43/14/27 6701)  Complications: No complications documented.

## 2021-03-30 NOTE — Progress Notes (Signed)
Went over d/c instructions with patient and spouse.

## 2021-03-30 NOTE — Interval H&P Note (Signed)
History and Physical Interval Note:  03/30/2021 9:28 AM  Benjamin Fuller  has presented today for surgery, with the diagnosis of afib.  The various methods of treatment have been discussed with the patient and family. After consideration of risks, benefits and other options for treatment, the patient has consented to  Procedure(s): ATRIAL FIBRILLATION ABLATION (N/A) as a surgical intervention.  The patient's history has been reviewed, patient examined, no change in status, stable for surgery.  I have reviewed the patient's chart and labs.  Questions were answered to the patient's satisfaction.    He reports compliance with eliquis without interruption.  Cardiac CT and echo reviewed with him at length today.  Risk, benefits, and alternatives to EP study and radiofrequency ablation for afib were also discussed in detail today. These risks include but are not limited to stroke, bleeding, vascular damage, tamponade, perforation, damage to the esophagus, lungs, and other structures, pulmonary vein stenosis, worsening renal function, and death. The patient understands these risk and wishes to proceed.    Thompson Grayer

## 2021-03-30 NOTE — Anesthesia Postprocedure Evaluation (Signed)
Anesthesia Post Note  Patient: Benjamin Fuller  Procedure(s) Performed: ATRIAL FIBRILLATION ABLATION (N/A )     Patient location during evaluation: PACU Anesthesia Type: General Level of consciousness: awake and alert Pain management: pain level controlled Vital Signs Assessment: post-procedure vital signs reviewed and stable Respiratory status: spontaneous breathing, nonlabored ventilation and respiratory function stable Cardiovascular status: blood pressure returned to baseline and stable Postop Assessment: no apparent nausea or vomiting Anesthetic complications: no   No complications documented.  Last Vitals:  Vitals:   03/30/21 1415 03/30/21 1445  BP: 120/68 127/66  Pulse: 77 78  Resp: 20 14  Temp:    SpO2: 96% 93%    Last Pain:  Vitals:   03/30/21 1415  TempSrc:   PainSc: [place]

## 2021-03-30 NOTE — Discharge Instructions (Signed)
Post procedure care instructions No driving for 4 days. No lifting over 5 lbs for 1 week. No vigorous or sexual activity for 1 week. You may return to work/your usual activities on 04/07/21. Keep procedure site clean & dry. If you notice increased pain, swelling, bleeding or pus, call/return!  You may shower after 24 hours, but no soaking in baths/hot tubs/pools for 1 week.   You have an appointment set up with the [facility].  Multiple studies have shown that being followed by a dedicated atrial fibrillation clinic in addition to the standard care you receive from your other physicians improves health. We believe that enrollment in the atrial fibrillation clinic will allow [place] to better care for you.   The phone number to the [facility] is [phone]. The clinic is staffed Monday through Friday from 8:30am to 5pm.  Parking Directions: The clinic is located in the Heart and Vascular Building connected to [facility]. 1)From [address] turn on to [address] and go to the 3rd entrance  (Heart and Vascular entrance) on the right. 2)Look to the right for Heart &Vascular Parking Garage. 3)A code for the entrance is required, for April is 2231.   4)Take the elevators to the 1st floor. Registration is in the room with the glass walls at the end of the hallway.  If you have any trouble parking or locating the clinic, please don't hesitate to call [phone].   Cardiac Ablation, Care After  This sheet gives you information about how to care for yourself after your procedure. Your health care provider may also give you more specific instructions. If you have problems or questions, contact your health care provider. What can I expect after the procedure? After the procedure, it is common to have:  Bruising around your puncture site.  Tenderness around your puncture site.  Skipped heartbeats.  Tiredness (fatigue).  Follow these instructions at  home: Puncture site care   Follow instructions from your health care provider about how to take care of your puncture site. Make sure you: ? If present, leave stitches (sutures), skin glue, or adhesive strips in place. These skin closures may need to stay in place for up to 2 weeks. If adhesive strip edges start to loosen and curl up, you may trim the loose edges. Do not remove adhesive strips completely unless your health care provider tells you to do that. ? If a large square bandage is present, this may be removed 24 hours after surgery.   Check your puncture site every day for signs of infection. Check for: ? Redness, swelling, or pain. ? Fluid or blood. If your puncture site starts to bleed, lie down on your back, apply firm pressure to the area, and contact your health care provider. ? Warmth. ? Pus or a bad smell. Driving  Do not drive for at least 4 days after your procedure or however long your health care provider recommends. (Do not resume driving if you have previously been instructed not to drive for other health reasons.)  Do not drive or use heavy machinery while taking prescription pain medicine. Activity  Avoid activities that take a lot of effort for at least 7 days after your procedure.  Do not lift anything that is heavier than 5 lb (4.5 kg) for one week.   No sexual activity for 1 week.   Return to your normal activities as told by your health care provider. Ask your health care provider what activities are  safe for you. General instructions  Take over-the-counter and prescription medicines only as told by your health care provider.  Do not use any products that contain nicotine or tobacco, such as cigarettes and e-cigarettes. If you need help quitting, ask your health care provider.  You may shower after 24 hours, but Do not take baths, swim, or use a hot tub for 1 week.   Do not drink alcohol for 24 hours after your procedure.  Keep all follow-up visits as  told by your health care provider. This is important. Contact a health care provider if:  You have redness, mild swelling, or pain around your puncture site.  You have fluid or blood coming from your puncture site that stops after applying firm pressure to the area.  Your puncture site feels warm to the touch.  You have pus or a bad smell coming from your puncture site.  You have a fever.  You have chest pain or discomfort that spreads to your neck, jaw, or arm.  You are sweating a lot.  You feel nauseous.  You have a fast or irregular heartbeat.  You have shortness of breath.  You are dizzy or light-headed and feel the need to lie down.  You have pain or numbness in the arm or leg closest to your puncture site. Get help right away if:  Your puncture site suddenly swells.  Your puncture site is bleeding and the bleeding does not stop after applying firm pressure to the area. These symptoms may represent a serious problem that is an emergency. Do not wait to see if the symptoms will go away. Get medical help right away. Call your local emergency services (911 in the [place] Do not drive yourself to the hospital. Summary  After the procedure, it is normal to have bruising and tenderness at the puncture site in your groin, neck, or forearm.  Check your puncture site every day for signs of infection.  Get help right away if your puncture site is bleeding and the bleeding does not stop after applying firm pressure to the area. This is a medical emergency. This information is not intended to replace advice given to you by your health care provider. Make sure you discuss any questions you have with your health care provider.

## 2021-03-30 NOTE — Anesthesia Preprocedure Evaluation (Addendum)
Anesthesia Evaluation  Patient identified by MRN, date of birth, ID band Patient awake    Reviewed: Allergy & Precautions, NPO status , Patient's Chart, lab work & pertinent test results, reviewed documented beta blocker date and time   History of Anesthesia Complications Negative for: history of anesthetic complications  Airway Mallampati: III  TM Distance: >3 FB Neck ROM: Full    Dental  (+) Dental Advisory Given, Teeth Intact   Pulmonary former smoker,    Pulmonary exam normal        Cardiovascular hypertension, Pt. on medications and Pt. on home beta blockers + dysrhythmias Atrial Fibrillation  Rhythm:Irregular Rate:Normal   '22 TTE - EF 60 to 65%. Mild concentric left ventricular  hypertrophy. Mild mitral valve regurgitation. There is mild dilatation of the aortic root, measuring 38 mm.    Neuro/Psych negative neurological ROS  negative psych ROS   GI/Hepatic negative GI ROS, Neg liver ROS,   Endo/Other  diabetes, Type 2, Oral Hypoglycemic Agents  Renal/GU negative Renal ROS     Musculoskeletal  (+) Arthritis ,   Abdominal   Peds  Hematology  On eliquis    Anesthesia Other Findings Covid test negative   Reproductive/Obstetrics                            Anesthesia Physical Anesthesia Plan  ASA: III  Anesthesia Plan: General   Post-op Pain Management:    Induction: Intravenous  PONV Risk Score and Plan: 2 and Treatment may vary due to age or medical condition, Ondansetron and Dexamethasone  Airway Management Planned: Oral ETT  Additional Equipment: None  Intra-op Plan:   Post-operative Plan: Extubation in OR  Informed Consent: I have reviewed the patients History and Physical, chart, labs and discussed the procedure including the risks, benefits and alternatives for the proposed anesthesia with the patient or authorized representative who has indicated his/her  understanding and acceptance.     Dental advisory given  Plan Discussed with: CRNA and Anesthesiologist  Anesthesia Plan Comments:        Anesthesia Quick Evaluation

## 2021-03-31 ENCOUNTER — Encounter (HOSPITAL_COMMUNITY): Payer: Self-pay | Admitting: Internal Medicine
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
## 2021-04-27 ENCOUNTER — Other Ambulatory Visit: Payer: Self-pay

## 2021-04-27 ENCOUNTER — Ambulatory Visit (HOSPITAL_COMMUNITY)
Admission: RE | Admit: 2021-04-27 | Discharge: 2021-04-27 | Disposition: A | Discharge status: Home / Self Care | Payer: Medicare Other | Source: Ambulatory Visit | Attending: Physician Assistant | Admitting: Physician Assistant

## 2021-04-27 ENCOUNTER — Encounter (HOSPITAL_COMMUNITY): Payer: Self-pay | Admitting: Physician Assistant

## 2021-04-27 VITALS — BP 110/66 | HR 72 | Ht 66.0 in | Wt 173.2 lb

## 2021-04-27 DIAGNOSIS — Z888 Allergy status to other drugs, medicaments and biological substances status: Secondary | ICD-10-CM | POA: Insufficient documentation

## 2021-04-27 DIAGNOSIS — D6869 Other thrombophilia: Secondary | ICD-10-CM | POA: Insufficient documentation

## 2021-04-27 DIAGNOSIS — N400 Enlarged prostate without lower urinary tract symptoms: Secondary | ICD-10-CM | POA: Diagnosis not present

## 2021-04-27 DIAGNOSIS — E782 Mixed hyperlipidemia: Secondary | ICD-10-CM | POA: Diagnosis not present

## 2021-04-27 DIAGNOSIS — I10 Essential (primary) hypertension: Secondary | ICD-10-CM | POA: Diagnosis not present

## 2021-04-27 DIAGNOSIS — E785 Hyperlipidemia, unspecified: Secondary | ICD-10-CM | POA: Diagnosis not present

## 2021-04-27 DIAGNOSIS — I4819 Other persistent atrial fibrillation: Secondary | ICD-10-CM | POA: Insufficient documentation

## 2021-04-27 DIAGNOSIS — Z87891 Personal history of nicotine dependence: Secondary | ICD-10-CM | POA: Diagnosis not present

## 2021-04-27 DIAGNOSIS — Z7984 Long term (current) use of oral hypoglycemic drugs: Secondary | ICD-10-CM | POA: Insufficient documentation

## 2021-04-27 DIAGNOSIS — I4891 Unspecified atrial fibrillation: Secondary | ICD-10-CM | POA: Diagnosis not present

## 2021-04-27 DIAGNOSIS — Z79899 Other long term (current) drug therapy: Secondary | ICD-10-CM | POA: Diagnosis not present

## 2021-04-27 DIAGNOSIS — E119 Type 2 diabetes mellitus without complications: Secondary | ICD-10-CM | POA: Diagnosis not present

## 2021-04-27 DIAGNOSIS — Z7901 Long term (current) use of anticoagulants: Secondary | ICD-10-CM | POA: Insufficient documentation

## 2021-04-28 ENCOUNTER — Ambulatory Visit: Payer: Medicare Other | Admitting: Cardiology
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
## 2021-06-07 ENCOUNTER — Encounter: Payer: Self-pay | Admitting: Cardiology

## 2021-06-07 ENCOUNTER — Ambulatory Visit (INDEPENDENT_AMBULATORY_CARE_PROVIDER_SITE_OTHER): Payer: Medicare Other | Admitting: Cardiology

## 2021-06-07 ENCOUNTER — Other Ambulatory Visit: Payer: Self-pay

## 2021-06-07 VITALS — BP 117/62 | HR 77 | Ht 66.0 in | Wt 171.0 lb

## 2021-06-07 DIAGNOSIS — E119 Type 2 diabetes mellitus without complications: Secondary | ICD-10-CM

## 2021-06-07 DIAGNOSIS — Z7901 Long term (current) use of anticoagulants: Secondary | ICD-10-CM

## 2021-06-07 DIAGNOSIS — D6869 Other thrombophilia: Secondary | ICD-10-CM

## 2021-06-07 DIAGNOSIS — I10 Essential (primary) hypertension: Secondary | ICD-10-CM

## 2021-06-07 DIAGNOSIS — I480 Paroxysmal atrial fibrillation: Secondary | ICD-10-CM | POA: Diagnosis not present

## 2021-06-07 DIAGNOSIS — Z7189 Other specified counseling: Secondary | ICD-10-CM

## 2021-06-07 NOTE — Patient Instructions (Signed)

## 2021-06-15 DIAGNOSIS — Z7984 Long term (current) use of oral hypoglycemic drugs: Secondary | ICD-10-CM | POA: Diagnosis not present

## 2021-06-15 DIAGNOSIS — E1169 Type 2 diabetes mellitus with other specified complication: Secondary | ICD-10-CM | POA: Diagnosis not present

## 2021-06-15 DIAGNOSIS — D6869 Other thrombophilia: Secondary | ICD-10-CM | POA: Diagnosis not present

## 2021-06-15 DIAGNOSIS — I10 Essential (primary) hypertension: Secondary | ICD-10-CM | POA: Diagnosis not present

## 2021-06-29 ENCOUNTER — Ambulatory Visit: Payer: Medicare Other | Admitting: Internal Medicine

## 2021-07-28 DIAGNOSIS — Z23 Encounter for immunization: Secondary | ICD-10-CM | POA: Diagnosis not present

## 2021-07-31 ENCOUNTER — Ambulatory Visit (INDEPENDENT_AMBULATORY_CARE_PROVIDER_SITE_OTHER): Payer: Medicare Other | Admitting: Internal Medicine

## 2021-07-31 ENCOUNTER — Other Ambulatory Visit: Payer: Self-pay

## 2021-07-31 VITALS — BP 116/60 | HR 79 | Temp 94.0°F | Ht 66.0 in | Wt 170.4 lb

## 2021-07-31 DIAGNOSIS — D6869 Other thrombophilia: Secondary | ICD-10-CM | POA: Diagnosis not present

## 2021-07-31 DIAGNOSIS — I4819 Other persistent atrial fibrillation: Secondary | ICD-10-CM

## 2021-07-31 DIAGNOSIS — R5383 Other fatigue: Secondary | ICD-10-CM | POA: Diagnosis not present

## 2021-07-31 DIAGNOSIS — I10 Essential (primary) hypertension: Secondary | ICD-10-CM | POA: Diagnosis not present

## 2021-07-31 MED ORDER — METOPROLOL SUCCINATE ER 25 MG PO TB24: 12.5000 mg | ORAL_TABLET | Freq: Every day | ORAL | 0 refills | Status: DC

## 2021-07-31 NOTE — Patient Instructions (Addendum)
Medication Instructions:  Reduce Metoprolol Succinate to 12.5 mg daily for 6 weeks then stop. Your physician recommends that you continue on your current medications as directed. Please refer to the Current Medication list given to you today.  Labwork: None ordered.  Testing/Procedures: None ordered.  Follow-Up: Your physician wants you to follow-up in: 04/30/22 10:45 am with Thompson Grayer, MD  Any Other Special Instructions Will Be Listed Below (If Applicable).  If you need a refill on your cardiac medications before your next appointment, please call your pharmacy.
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
## 2021-11-16 DIAGNOSIS — Z23 Encounter for immunization: Secondary | ICD-10-CM | POA: Diagnosis not present

## 2021-11-17 DIAGNOSIS — U071 COVID-19: Secondary | ICD-10-CM | POA: Diagnosis not present

## 2021-12-08 DIAGNOSIS — N400 Enlarged prostate without lower urinary tract symptoms: Secondary | ICD-10-CM | POA: Diagnosis not present

## 2021-12-08 DIAGNOSIS — I10 Essential (primary) hypertension: Secondary | ICD-10-CM | POA: Diagnosis not present

## 2021-12-08 DIAGNOSIS — Z125 Encounter for screening for malignant neoplasm of prostate: Secondary | ICD-10-CM | POA: Diagnosis not present

## 2021-12-08 DIAGNOSIS — D6869 Other thrombophilia: Secondary | ICD-10-CM | POA: Diagnosis not present

## 2021-12-08 DIAGNOSIS — I4891 Unspecified atrial fibrillation: Secondary | ICD-10-CM | POA: Diagnosis not present

## 2021-12-08 DIAGNOSIS — E782 Mixed hyperlipidemia: Secondary | ICD-10-CM | POA: Diagnosis not present

## 2021-12-08 DIAGNOSIS — E1169 Type 2 diabetes mellitus with other specified complication: Secondary | ICD-10-CM | POA: Diagnosis not present

## 2021-12-08 DIAGNOSIS — Z0000 Encounter for general adult medical examination without abnormal findings: Secondary | ICD-10-CM | POA: Diagnosis not present

## 2021-12-08 DIAGNOSIS — Z7984 Long term (current) use of oral hypoglycemic drugs: Secondary | ICD-10-CM | POA: Diagnosis not present

## 2021-12-08 DIAGNOSIS — Z1389 Encounter for screening for other disorder: Secondary | ICD-10-CM | POA: Diagnosis not present

## 2021-12-11 ENCOUNTER — Other Ambulatory Visit: Payer: Self-pay

## 2021-12-11 ENCOUNTER — Encounter (HOSPITAL_BASED_OUTPATIENT_CLINIC_OR_DEPARTMENT_OTHER): Payer: Self-pay | Admitting: Cardiology

## 2021-12-11 ENCOUNTER — Ambulatory Visit (INDEPENDENT_AMBULATORY_CARE_PROVIDER_SITE_OTHER): Payer: Medicare Other | Admitting: Cardiology

## 2021-12-11 VITALS — BP 138/56 | HR 49 | Ht 66.0 in | Wt 176.5 lb

## 2021-12-11 DIAGNOSIS — I480 Paroxysmal atrial fibrillation: Secondary | ICD-10-CM

## 2021-12-11 DIAGNOSIS — E119 Type 2 diabetes mellitus without complications: Secondary | ICD-10-CM

## 2021-12-11 DIAGNOSIS — I10 Essential (primary) hypertension: Secondary | ICD-10-CM

## 2021-12-11 DIAGNOSIS — Z7901 Long term (current) use of anticoagulants: Secondary | ICD-10-CM | POA: Diagnosis not present

## 2021-12-11 DIAGNOSIS — D6869 Other thrombophilia: Secondary | ICD-10-CM | POA: Diagnosis not present

## 2021-12-11 DIAGNOSIS — R001 Bradycardia, unspecified: Secondary | ICD-10-CM

## 2021-12-11 NOTE — Patient Instructions (Signed)
Medication Instructions:  Your Physician recommend you continue on your current medication as directed.    *If you need a refill on your cardiac medications before your next appointment, please call your pharmacy*   Lab Work: None ordered today   Testing/Procedures: None ordered today   Follow-Up: At [facility], you and your health needs are our priority.  As part of our continuing mission to provide you with exceptional heart care, we have created designated Provider Care Teams.  These Care Teams include your primary Cardiologist (physician) and Advanced Practice Providers (APPs -  Physician Assistants and Nurse Practitioners) who all work together to provide you with the care you need, when you need it.  We recommend signing up for the patient portal called "MyChart".  Sign up information is provided on this After Visit Summary.  MyChart is used to connect with patients for Virtual Visits (Telemedicine).  Patients are able to view lab/test results, encounter notes, upcoming appointments, etc.  Non-urgent messages can be sent to your provider as well.   To learn more about what you can do with MyChart, go to [email or web address].    Your next appointment:   2 month(s)  The format for your next appointment:   In Person  Provider:   Bridgette Christopher, MD{      
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
## 2021-12-14 ENCOUNTER — Telehealth (HOSPITAL_BASED_OUTPATIENT_CLINIC_OR_DEPARTMENT_OTHER): Payer: Self-pay | Admitting: Cardiology

## 2021-12-14 DIAGNOSIS — R001 Bradycardia, unspecified: Secondary | ICD-10-CM

## 2021-12-14 NOTE — Telephone Encounter (Signed)
Patient called back to say that his symptoms has improve since his visit on Monday. He would like for the dr to tell him what he needs to do. Please advise

## 2021-12-14 NOTE — Telephone Encounter (Signed)
Spoke to pt. He report since his visit on Monday 12/11/21, he's felt washed out and can hardly do his normal activities. He report he has no energy and most days he just want to sleep. He state Tuesday he went to the grocery store with his wife and was unable to to go in. He report he stayed in the car and slept. He report HR has been in the high 40's-low 50's. Systolic in 330'Q and blood sugars 160-180's.   Will forward to MD for recommendations

## 2021-12-15 ENCOUNTER — Encounter (HOSPITAL_BASED_OUTPATIENT_CLINIC_OR_DEPARTMENT_OTHER): Payer: Self-pay

## 2021-12-15 NOTE — Telephone Encounter (Signed)
Discussed with Overton Mam NP, recommends seeing EP for bradycardia and if fever persists Urgent Care Spoke with patient and reviewed recommendations.  He stated his fever had passed and thought maybe he was fighting something off earlier in week  Explained that could contribute to elevated elevated blood sugars but if they continue to reach out to PCP  Referral placed and message to [place] to schedule

## 2021-12-15 NOTE — Telephone Encounter (Signed)
See phone note 12/15

## 2021-12-20 ENCOUNTER — Ambulatory Visit (INDEPENDENT_AMBULATORY_CARE_PROVIDER_SITE_OTHER): Payer: Medicare Other | Admitting: Student

## 2021-12-20 ENCOUNTER — Encounter: Payer: Self-pay | Admitting: Student

## 2021-12-20 ENCOUNTER — Other Ambulatory Visit: Payer: Self-pay

## 2021-12-20 VITALS — BP 134/60 | HR 46 | Ht 66.0 in | Wt 181.0 lb

## 2021-12-20 DIAGNOSIS — Z7901 Long term (current) use of anticoagulants: Secondary | ICD-10-CM

## 2021-12-20 DIAGNOSIS — I442 Atrioventricular block, complete: Secondary | ICD-10-CM | POA: Insufficient documentation

## 2021-12-20 DIAGNOSIS — I480 Paroxysmal atrial fibrillation: Secondary | ICD-10-CM

## 2021-12-20 DIAGNOSIS — R001 Bradycardia, unspecified: Secondary | ICD-10-CM

## 2021-12-20 LAB — CBC
Hematocrit: 40.4 % (ref 37.5–51.0)
Hemoglobin: 13.4 g/dL (ref 13.0–17.7)
MCH: 31.1 pg (ref 26.6–33.0)
MCHC: 33.2 g/dL (ref 31.5–35.7)
MCV: 94 fL (ref 79–97)
Platelets: 213 10*3/uL (ref 150–450)
RBC: 4.31 x10E6/uL (ref 4.14–5.80)
RDW: 13.5 % (ref 11.6–15.4)
WBC: 6.6 10*3/uL (ref 3.4–10.8)

## 2021-12-20 LAB — BASIC METABOLIC PANEL
BUN/Creatinine Ratio: 19 (ref 10–24)
BUN: 15 mg/dL (ref 8–27)
CO2: 24 mmol/L (ref 20–29)
Calcium: 8.9 mg/dL (ref 8.6–10.2)
Chloride: 106 mmol/L (ref 96–106)
Creatinine, Ser: 0.77 mg/dL (ref 0.76–1.27)
Glucose: 331 mg/dL — ABNORMAL HIGH (ref 70–99)
Potassium: 4.2 mmol/L (ref 3.5–5.2)
Sodium: 140 mmol/L (ref 134–144)
eGFR: 91 mL/min/{1.73_m2} (ref >59)
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
## 2021-12-20 NOTE — Patient Instructions (Addendum)
Implantable Device Instructions   You are scheduled for: Pacemaker Implant on 12/21/2021 with Dr.Taylor.   1.   Pre procedure testing-             A.    Labs- BMET, CBC STAT    2.  PROCEDURE DAY: -On the day of your procedure 12/21/2021 at 12:00 --you will go to [facility] ([address]).  You will go to the main entrance A [employer]) and enter where the [employer] parking staff are.  You will check in at ADMITTING.  You may have one support person come in to the hospital with you.  They will be asked to wait in the waiting room.  It is OK to have someone drop you off and come back when you are ready to be discharged.     -    No eating or drinking after midnight prior to procedure.   - HOLD ELIQUIS UNTIL AFTER YOUR PROCEDURE!!    -   Medication instructions:  On the morning of your procedure ONLY take Ramipril with a small sip of water.   - The night before your procedure and the morning of your procedure scrub your neck/chest with CHG surgical scrub.  See instruction letter.  -  Plan for an overnight stay but you may be discharged after your procedure.  If you are discharged after your procedure you will need someone to drive you home and be with you for 24 hours after your procedure.   -  You will follow up with the [facility] 10-14 days after your procedure. - You will follow up with Dr. Lovena Le 91 days after your procedure.  These appointments will be made for you.   * If you have ANY questions after you get home, please call the office at [phone] or send a MyChart message.

## 2021-12-21 ENCOUNTER — Ambulatory Visit (HOSPITAL_COMMUNITY)
Admission: RE | Admit: 2021-12-21 | Discharge: 2021-12-21 | Disposition: A | Discharge status: Home / Self Care | Payer: Medicare Other | Attending: Internal Medicine | Admitting: Internal Medicine

## 2021-12-21 ENCOUNTER — Encounter (HOSPITAL_COMMUNITY)
Admission: RE | Disposition: A | Discharge status: Home / Self Care | Payer: Self-pay | Source: Home / Self Care | Attending: Internal Medicine

## 2021-12-21 ENCOUNTER — Ambulatory Visit (HOSPITAL_COMMUNITY): Payer: Medicare Other

## 2021-12-21 DIAGNOSIS — E785 Hyperlipidemia, unspecified: Secondary | ICD-10-CM | POA: Insufficient documentation

## 2021-12-21 DIAGNOSIS — I10 Essential (primary) hypertension: Secondary | ICD-10-CM | POA: Diagnosis not present

## 2021-12-21 DIAGNOSIS — I4819 Other persistent atrial fibrillation: Secondary | ICD-10-CM | POA: Diagnosis not present

## 2021-12-21 DIAGNOSIS — R001 Bradycardia, unspecified: Secondary | ICD-10-CM | POA: Diagnosis not present

## 2021-12-21 DIAGNOSIS — I700 Atherosclerosis of aorta: Secondary | ICD-10-CM | POA: Diagnosis not present

## 2021-12-21 DIAGNOSIS — I447 Left bundle-branch block, unspecified: Secondary | ICD-10-CM | POA: Diagnosis not present

## 2021-12-21 DIAGNOSIS — Z981 Arthrodesis status: Secondary | ICD-10-CM | POA: Diagnosis not present

## 2021-12-21 DIAGNOSIS — R5383 Other fatigue: Secondary | ICD-10-CM | POA: Diagnosis not present

## 2021-12-21 DIAGNOSIS — Z950 Presence of cardiac pacemaker: Secondary | ICD-10-CM | POA: Diagnosis not present

## 2021-12-21 DIAGNOSIS — I442 Atrioventricular block, complete: Secondary | ICD-10-CM | POA: Insufficient documentation

## 2021-12-21 HISTORY — PX: PACEMAKER IMPLANT: EP1218

## 2021-12-21 LAB — GLUCOSE, CAPILLARY: Glucose-Capillary: 183 mg/dL — ABNORMAL HIGH (ref 70–99)

## 2021-12-21 SURGERY — PACEMAKER IMPLANT

## 2021-12-21 MED ORDER — VANCOMYCIN HCL IN DEXTROSE 1-5 GM/200ML-% IV SOLN
INTRAVENOUS | Status: AC
  Filled 2021-12-21: qty 200

## 2021-12-21 MED ORDER — FENTANYL CITRATE (PF) 100 MCG/2ML IJ SOLN
INTRAMUSCULAR | Status: AC
  Filled 2021-12-21: qty 2

## 2021-12-21 MED ORDER — HEPARIN (PORCINE) IN NACL 1000-0.9 UT/500ML-% IV SOLN
INTRAVENOUS | Status: AC
  Filled 2021-12-21: qty 500

## 2021-12-21 MED ORDER — FENTANYL CITRATE (PF) 100 MCG/2ML IJ SOLN
INTRAMUSCULAR | Status: DC | PRN
  Administered 2021-12-21: 12.5 ug via INTRAVENOUS

## 2021-12-21 MED ORDER — MIDAZOLAM HCL 5 MG/5ML IJ SOLN
INTRAMUSCULAR | Status: AC
  Filled 2021-12-21: qty 5

## 2021-12-21 MED ORDER — LIDOCAINE HCL (PF) 1 % IJ SOLN
INTRAMUSCULAR | Status: AC
  Filled 2021-12-21: qty 30

## 2021-12-21 MED ORDER — SODIUM CHLORIDE 0.9 % IV SOLN
80.0000 mg | INTRAVENOUS | Status: AC
  Administered 2021-12-21: 11:00:00 80 mg

## 2021-12-21 MED ORDER — SODIUM CHLORIDE 0.9 % IV SOLN: INTRAVENOUS | Status: DC

## 2021-12-21 MED ORDER — VANCOMYCIN HCL IN DEXTROSE 1-5 GM/200ML-% IV SOLN
1000.0000 mg | INTRAVENOUS | Status: AC
  Administered 2021-12-21: 10:00:00 1000 mg via INTRAVENOUS

## 2021-12-21 MED ORDER — HEPARIN (PORCINE) IN NACL 1000-0.9 UT/500ML-% IV SOLN
INTRAVENOUS | Status: DC | PRN
  Administered 2021-12-21: 500 mL

## 2021-12-21 MED ORDER — LIDOCAINE HCL (PF) 1 % IJ SOLN
INTRAMUSCULAR | Status: DC | PRN
  Administered 2021-12-21: 50 mL

## 2021-12-21 MED ORDER — CHLORHEXIDINE GLUCONATE 4 % EX LIQD
4.0000 "application " | Freq: Once | CUTANEOUS | Status: AC
  Administered 2021-12-21: 4 via TOPICAL
  Filled 2021-12-21: qty 60

## 2021-12-21 MED ORDER — MIDAZOLAM HCL 5 MG/5ML IJ SOLN
INTRAMUSCULAR | Status: DC | PRN
  Administered 2021-12-21: 1 mg via INTRAVENOUS

## 2021-12-21 MED ORDER — ONDANSETRON HCL 4 MG/2ML IJ SOLN: 4.0000 mg | Freq: Four times a day (QID) | INTRAMUSCULAR | Status: DC | PRN

## 2021-12-21 MED ORDER — SODIUM CHLORIDE 0.9 % IV SOLN
INTRAVENOUS | Status: AC
  Filled 2021-12-21: qty 2

## 2021-12-21 MED ORDER — ACETAMINOPHEN 325 MG PO TABS
325.0000 mg | ORAL_TABLET | ORAL | Status: DC | PRN
  Administered 2021-12-21: 12:00:00 650 mg via ORAL
  Filled 2021-12-21: qty 2

## 2021-12-21 SURGICAL SUPPLY — 11 items
CABLE SURGICAL S-101-97-12 (CABLE) ×3 IMPLANT
CATH SELECT PACE 669183 (CATHETERS) ×2 IMPLANT
CUTTER LV DELIVERY CATHETER 7 (MISCELLANEOUS) ×2 IMPLANT
LEAD INGEVITY 7841 52 (Lead) ×2 IMPLANT
LEAD INGEVITY 7842 59 (Lead) ×2 IMPLANT
PACEMAKER ACCOLADE DR-EL (Pacemaker) ×2 IMPLANT
PAD DEFIB RADIO PHYSIO CONN (PAD) ×3 IMPLANT
SHEATH 7FR PRELUDE SNAP 13 (SHEATH) ×2 IMPLANT
SHEATH 9FR PRELUDE SNAP 13 (SHEATH) ×2 IMPLANT
TRAY PACEMAKER INSERTION (PACKS) ×3 IMPLANT
WIRE HI TORQ VERSACORE-J 145CM (WIRE) ×2 IMPLANT
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
## 2021-12-21 NOTE — Discharge Instructions (Signed)
° ° °  Supplemental Discharge Instructions for  Pacemaker/Defibrillator Patients  Tomorrow, 12/22/21, send in a device transmission  Activity No heavy lifting or vigorous activity with your left/right arm for 6 to 8 weeks.  Do not raise your left/right arm above your head for one week.  Gradually raise your affected arm as drawn below.             12/26/21                   12/27/21                  12/28/21                12/29/21 __  NO DRIVING for  1 week  ; you may begin driving on  03/55/97   .  WOUND CARE Keep the wound area clean and dry.  Do not get this area wet , no showers for one week; you may shower on  12/29/21   . Tomorrow, 12/22/21, remove the arm sling Tomorrow, 12/22/21 remove the LARGE outer plastic bandage.  Underneath the plastic bandage there are steri strips (paper tapes), DO NOT remove these. The tape/steri-strips on your wound will fall off; do not pull them off.  No bandage is needed on the site.  DO  NOT apply any creams, oils, or ointments to the wound area. If you notice any drainage or discharge from the wound, any swelling or bruising at the site, or you develop a fever > 101? F after you are discharged home, call the office at once.  Special Instructions You are still able to use cellular telephones; use the ear opposite the side where you have your pacemaker/defibrillator.  Avoid carrying your cellular phone near your device. When traveling through airports, show security personnel your identification card to avoid being screened in the metal detectors.  Ask the security personnel to use the hand wand. Avoid arc welding equipment, MRI testing (magnetic resonance imaging), TENS units (transcutaneous nerve stimulators).  Call the office for questions about other devices. Avoid electrical appliances that are in poor condition or are not properly grounded. Microwave ovens are safe to be near or to operate.

## 2021-12-22 ENCOUNTER — Encounter (HOSPITAL_COMMUNITY): Payer: Self-pay | Admitting: Internal Medicine

## 2021-12-22 ENCOUNTER — Ambulatory Visit: Payer: Medicare Other | Admitting: Student

## 2021-12-22 ENCOUNTER — Telehealth: Payer: Self-pay

## 2021-12-22 NOTE — Telephone Encounter (Signed)
Follow-up after same day discharge: Implant date: 12/21/21 MD: Cristopher Peru, MD Device: PPM Location: left chest   Wound check visit: 01/03/21 at 3:20 90 day MD follow-up: 03/27/22 at 2:15  Remote Transmission received: No. "Communicator not set up". Patient has not plugged in remote monitor however states he will later today.  Dressing removed: Patient states he will remove the dressing in 1 hour. Sling has been removed.  Patient doing well. Denies discomfort. Dressing D/I. No swelling. Reminded to follow arm and driving restrictions until seen in device clinic as indicated above. Patient is provided device clinic contact and holiday hours if any additional questions or concerns arise.

## 2021-12-22 NOTE — Telephone Encounter (Signed)
-----   Message from [place], [place] sent at 12/21/2021  1:14 PM EST ----- Same day d/c   Bsci PPM  GT

## 2022-01-03 ENCOUNTER — Other Ambulatory Visit: Payer: Self-pay

## 2022-01-03 ENCOUNTER — Ambulatory Visit (INDEPENDENT_AMBULATORY_CARE_PROVIDER_SITE_OTHER): Payer: Medicare Other

## 2022-01-03 DIAGNOSIS — I442 Atrioventricular block, complete: Secondary | ICD-10-CM | POA: Diagnosis not present

## 2022-01-03 LAB — CUP PACEART INCLINIC DEVICE CHECK
Date Time Interrogation Session: 20230104170304
Implantable Lead Implant Date: 20221222
Implantable Lead Implant Date: 20221222
Implantable Lead Location: 753859
Implantable Lead Location: 753860
Implantable Lead Model: 7841
Implantable Lead Model: 7842
Implantable Lead Serial Number: 1102393
Implantable Lead Serial Number: 1161013
Implantable Pulse Generator Implant Date: 20221222
Lead Channel Impedance Value: 685 Ohm
Lead Channel Impedance Value: 860 Ohm
Lead Channel Pacing Threshold Amplitude: 0.6 V
Lead Channel Pacing Threshold Amplitude: 0.8 V
Lead Channel Pacing Threshold Pulse Width: 0.4 ms
Lead Channel Pacing Threshold Pulse Width: 0.4 ms
Lead Channel Sensing Intrinsic Amplitude: 18.2 mV
Lead Channel Sensing Intrinsic Amplitude: 4.1 mV
Lead Channel Setting Pacing Amplitude: 1.2 V
Lead Channel Setting Pacing Amplitude: 3.5 V
Lead Channel Setting Pacing Pulse Width: 0.4 ms
Lead Channel Setting Sensing Sensitivity: 2.5 mV
Pulse Gen Serial Number: 562911

## 2022-01-03 NOTE — Progress Notes (Signed)
Wound check appointment. Steri-strips removed. Wound without redness or edema. Incision edges approximated, wound well healed. Normal device function. Thresholds, sensing, and impedances consistent with implant measurements. Device programmed at 3.5V/auto capture programmed on for extra safety margin until 3 month visit. Histogram distribution appropriate for patient and level of activity. No mode switches or high ventricular rates noted. 1 PMT event noted. Patient educated about wound care, arm mobility, lifting restrictions. ROV in 3 months with implanting physician.

## 2022-01-03 NOTE — Patient Instructions (Signed)

## 2022-01-18 ENCOUNTER — Other Ambulatory Visit: Payer: Self-pay | Admitting: Cardiology

## 2022-01-18 DIAGNOSIS — I4819 Other persistent atrial fibrillation: Secondary | ICD-10-CM

## 2022-01-18 DIAGNOSIS — Z7901 Long term (current) use of anticoagulants: Secondary | ICD-10-CM

## 2022-01-18 NOTE — Telephone Encounter (Signed)
Eliquis refill please °

## 2022-01-18 NOTE — Telephone Encounter (Signed)
Prescription refill request for Eliquis received. Indication: a fib Last office visit:12/20/21 Scr: 0.77 Age: 81 Weight: 82kg

## 2022-02-02 ENCOUNTER — Ambulatory Visit (INDEPENDENT_AMBULATORY_CARE_PROVIDER_SITE_OTHER): Payer: Medicare Other | Admitting: Nurse Practitioner

## 2022-02-02 ENCOUNTER — Encounter: Payer: Self-pay | Admitting: Nurse Practitioner

## 2022-02-02 ENCOUNTER — Telehealth: Payer: Self-pay

## 2022-02-02 VITALS — BP 110/60 | HR 79 | Ht 66.0 in | Wt 173.0 lb

## 2022-02-02 DIAGNOSIS — I4819 Other persistent atrial fibrillation: Secondary | ICD-10-CM

## 2022-02-02 DIAGNOSIS — Z86010 Personal history of colonic polyps: Secondary | ICD-10-CM | POA: Diagnosis not present

## 2022-02-02 NOTE — Patient Instructions (Signed)
It was my pleasure to provide care to you today. Based on our discussion, I am providing you with my recommendations below:  RECOMMENDATION(S): we will contact your cardiologist for official cardiac clearance with instructions on holding your blood thinner after you see him later this month. Once we have that clearance, we will contact you to schedule your colonoscopy.   BMI:  If you are age 81 or older, your body mass index should be between 23-30. Your Body mass index is 27.92 kg/m. If this is out of the aforementioned range listed, please consider follow up with your Primary Care Provider.  If you are age 56 or younger, your body mass index should be between 19-25. Your Body mass index is 27.92 kg/m. If this is out of the aformentioned range listed, please consider follow up with your Primary Care Provider.   MY CHART:  The [facility] providers would like to encourage you to use [facility] to communicate with providers for non-urgent requests or questions.  Due to long hold times on the telephone, sending your provider a message by [facility] may be a faster and more efficient way to get a response.  Please allow 48 business hours for a response.  Please remember that this is for non-urgent requests.   Thank you for trusting me with your gastrointestinal care!    Noralyn Pick, CRNP
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
## 2022-02-02 NOTE — Progress Notes (Signed)
Patient has cardiology follow up 2/13. I have set a reminder to contact cardiology after his appointment to obtain full cardiac clearance and instructions for holding Eliquis.

## 2022-02-02 NOTE — Telephone Encounter (Signed)
We need full cardiac clearance with instructions on holding Eliquis. Patient is scheduled to see them 02/12/22. Once that visit is complete, I will send a clearance request to his cardiologist for the colonoscopy as well as instructions for his Eliquis.  I have set myself a personal reminder for 2/13.

## 2022-02-04 NOTE — Progress Notes (Signed)
OK to proceed with colonoscopy after cardiology input.

## 2022-02-12 ENCOUNTER — Encounter (HOSPITAL_BASED_OUTPATIENT_CLINIC_OR_DEPARTMENT_OTHER): Payer: Self-pay | Admitting: Cardiology

## 2022-02-12 ENCOUNTER — Other Ambulatory Visit: Payer: Self-pay

## 2022-02-12 ENCOUNTER — Ambulatory Visit (INDEPENDENT_AMBULATORY_CARE_PROVIDER_SITE_OTHER): Payer: Medicare Other | Admitting: Cardiology

## 2022-02-12 VITALS — BP 140/74 | HR 75 | Ht 66.0 in | Wt 175.9 lb

## 2022-02-12 DIAGNOSIS — R001 Bradycardia, unspecified: Secondary | ICD-10-CM

## 2022-02-12 DIAGNOSIS — I10 Essential (primary) hypertension: Secondary | ICD-10-CM | POA: Diagnosis not present

## 2022-02-12 DIAGNOSIS — I480 Paroxysmal atrial fibrillation: Secondary | ICD-10-CM | POA: Diagnosis not present

## 2022-02-12 DIAGNOSIS — D6869 Other thrombophilia: Secondary | ICD-10-CM

## 2022-02-12 DIAGNOSIS — Z7901 Long term (current) use of anticoagulants: Secondary | ICD-10-CM

## 2022-02-12 DIAGNOSIS — Z950 Presence of cardiac pacemaker: Secondary | ICD-10-CM

## 2022-02-12 DIAGNOSIS — E119 Type 2 diabetes mellitus without complications: Secondary | ICD-10-CM

## 2022-02-12 NOTE — Patient Instructions (Signed)
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
## 2022-02-13 ENCOUNTER — Telehealth: Payer: Self-pay

## 2022-02-13 NOTE — Telephone Encounter (Signed)
-----   Message from [place] sent at 02/13/2022  8:16 AM EST -----  ----- Message ----- From: Ladene Artist, MD Sent: 02/13/2022   7:42 AM EST To: Lindon Romp, CMA, #  Colleen, reviewed 02/12/2022 cardiology office note. Pacemaker placed in 11/2021. Clearance is assumed and apixaban can be held as planned so proceed with colonoscopy.   ----- Message ----- From: Noralyn Pick, NP Sent: 02/13/2022   7:14 AM EST To: Ladene Artist, MD, Greggory Keen, LPN, #  Dr. Fuller Plan, refer to office visit 2/3. Pls review cardiac office note 02/22/2022, cardiac status stable at that time So..cardiac clearance is assumed based on her instruction "continue apixaban 5 mg BID. This can be held periprocedurally for colonoscopy, restart as soon as cleared by GI".  Pls let Loreta Blouch know if ok to schedule colonoscopy with instructions to hold Eliquis for 2 days.  THX

## 2022-02-16 ENCOUNTER — Telehealth: Payer: Self-pay | Admitting: Nurse Practitioner

## 2022-02-16 NOTE — Telephone Encounter (Signed)
See other phone notes.

## 2022-02-16 NOTE — Telephone Encounter (Signed)
Can I give this to you?

## 2022-02-16 NOTE — Telephone Encounter (Signed)
Patient called stating he went to see his cardiologist this past Monday and received clearance to schedule his procedure.  Please call patient and let him know how to proceed.  Thank you.

## 2022-02-16 NOTE — Telephone Encounter (Signed)
Colleen,  Based on previous phone notes, do we still need to send for official cardiac clearance, or good to get him scheduled?

## 2022-02-19 NOTE — Telephone Encounter (Signed)
Spoke to patient, times and days he prefers are not available. Patient decided on 04/16/22 at 9:00 am. I let him know I will send him his instructions and blood thinner info to his mychart.

## 2022-03-27 ENCOUNTER — Encounter: Payer: Self-pay | Admitting: Internal Medicine

## 2022-03-27 ENCOUNTER — Ambulatory Visit (INDEPENDENT_AMBULATORY_CARE_PROVIDER_SITE_OTHER): Payer: Medicare Other | Admitting: Internal Medicine

## 2022-03-27 ENCOUNTER — Other Ambulatory Visit: Payer: Self-pay

## 2022-03-27 VITALS — BP 104/60 | HR 83 | Ht 66.0 in | Wt 175.0 lb

## 2022-03-27 DIAGNOSIS — E785 Hyperlipidemia, unspecified: Secondary | ICD-10-CM

## 2022-03-27 DIAGNOSIS — I442 Atrioventricular block, complete: Secondary | ICD-10-CM | POA: Diagnosis not present

## 2022-03-27 DIAGNOSIS — I10 Essential (primary) hypertension: Secondary | ICD-10-CM

## 2022-03-27 DIAGNOSIS — I4819 Other persistent atrial fibrillation: Secondary | ICD-10-CM | POA: Diagnosis not present

## 2022-03-27 DIAGNOSIS — Z950 Presence of cardiac pacemaker: Secondary | ICD-10-CM | POA: Diagnosis not present

## 2022-03-27 NOTE — Patient Instructions (Signed)
Medication Instructions:  ?Your physician recommends that you continue on your current medications as directed. Please refer to the Current Medication list given to you today. ? ?Labwork: ?None ordered. ? ?Testing/Procedures: ?None ordered. ? ?Follow-Up: ?Your physician wants you to follow-up in: one year with Cristopher Peru, MD or one of the following Advanced Practice Providers on your designated Care Team:   ?Tommye Standard, PA-C ?Legrand Como "Jonni Sanger" Greenwood, PA-C ? ?Remote monitoring is used to monitor your Pacemaker from home. This monitoring reduces the number of office visits required to check your device to one time per year. It allows [place] to keep an eye on the functioning of your device to ensure it is working properly. You are scheduled for a device check from home on 04/04/2022. You may send your transmission at any time that day. If you have a wireless device, the transmission will be sent automatically. After your physician reviews your transmission, you will receive a postcard with your next transmission date. ? ?Any Other Special Instructions Will Be Listed Below (If Applicable). ? ?If you need a refill on your cardiac medications before your next appointment, please call your pharmacy.  ? ? ? ? ?
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
## 2022-04-04 ENCOUNTER — Ambulatory Visit (INDEPENDENT_AMBULATORY_CARE_PROVIDER_SITE_OTHER): Payer: Medicare Other

## 2022-04-04 DIAGNOSIS — I442 Atrioventricular block, complete: Secondary | ICD-10-CM | POA: Diagnosis not present

## 2022-04-04 LAB — CUP PACEART REMOTE DEVICE CHECK
Battery Remaining Longevity: 156 mo
Battery Remaining Percentage: 100 %
Brady Statistic RA Percent Paced: 2 %
Brady Statistic RV Percent Paced: 99 %
Date Time Interrogation Session: 20230405024100
Implantable Lead Implant Date: 20221222
Implantable Lead Implant Date: 20221222
Implantable Lead Location: 753859
Implantable Lead Location: 753860
Implantable Lead Model: 7841
Implantable Lead Model: 7842
Implantable Lead Serial Number: 1102393
Implantable Lead Serial Number: 1161013
Implantable Pulse Generator Implant Date: 20221222
Lead Channel Impedance Value: 627 Ohm
Lead Channel Impedance Value: 782 Ohm
Lead Channel Pacing Threshold Amplitude: 0.4 V
Lead Channel Pacing Threshold Pulse Width: 0.4 ms
Lead Channel Setting Pacing Amplitude: 2.5 V
Lead Channel Setting Pacing Amplitude: 2.5 V
Lead Channel Setting Pacing Pulse Width: 0.4 ms
Lead Channel Setting Sensing Sensitivity: 2.5 mV
Pulse Gen Serial Number: 562911

## 2022-04-16 ENCOUNTER — Encounter: Payer: Self-pay | Admitting: Gastroenterology

## 2022-04-16 ENCOUNTER — Ambulatory Visit (AMBULATORY_SURGERY_CENTER): Payer: Medicare Other | Admitting: Gastroenterology

## 2022-04-16 VITALS — BP 106/67 | HR 83 | Temp 98.9°F | Resp 12 | Ht 66.0 in | Wt 173.0 lb

## 2022-04-16 DIAGNOSIS — Z86010 Personal history of colonic polyps: Secondary | ICD-10-CM | POA: Diagnosis not present

## 2022-04-16 DIAGNOSIS — D123 Benign neoplasm of transverse colon: Secondary | ICD-10-CM

## 2022-04-16 MED ORDER — SODIUM CHLORIDE 0.9 % IV SOLN: 500.0000 mL | INTRAVENOUS | Status: DC

## 2022-04-16 NOTE — Progress Notes (Signed)
Report to PACU, RN, vss, BBS= Clear.  

## 2022-04-16 NOTE — Progress Notes (Signed)
Called to room to assist during endoscopic procedure.  Patient ID and intended procedure confirmed with present staff. Received instructions for my participation in the procedure from the performing physician.  

## 2022-04-16 NOTE — Progress Notes (Signed)
See 03/27/2022 H&P, no changes.  ?

## 2022-04-16 NOTE — Progress Notes (Signed)
Initial 5cc propfol did not make pt drowsy.  Pt complaining of burning in arm.  Could not feel a noticible lump.  IV d/c'd and restarted on L A/C.  Hot pack applied to old IV site ? ?

## 2022-04-16 NOTE — Patient Instructions (Signed)
Please read handouts provided. ?Continue present medications. ?Resume Eliquis ( apixaban ) in 2 days at prior dose. ?Await pathology results. ?High Fiber Diet. ? ? ?YOU HAD AN ENDOSCOPIC PROCEDURE TODAY AT [facility]:   Refer to the procedure report that was given to you for any specific questions about what was found during the examination.  If the procedure report does not answer your questions, please call your gastroenterologist to clarify.  If you requested that your care partner not be given the details of your procedure findings, then the procedure report has been included in a sealed envelope for you to review at your convenience later. ? ?YOU SHOULD EXPECT: Some feelings of bloating in the abdomen. Passage of more gas than usual.  Walking can help get rid of the air that was put into your GI tract during the procedure and reduce the bloating. If you had a lower endoscopy (such as a colonoscopy or flexible sigmoidoscopy) you may notice spotting of blood in your stool or on the toilet paper. If you underwent a bowel prep for your procedure, you may not have a normal bowel movement for a few days. ? ?Please Note:  You might notice some irritation and congestion in your nose or some drainage.  This is from the oxygen used during your procedure.  There is no need for concern and it should clear up in a day or so. ? ?SYMPTOMS TO REPORT IMMEDIATELY: ? ?Following lower endoscopy (colonoscopy or flexible sigmoidoscopy): ? Excessive amounts of blood in the stool ? Significant tenderness or worsening of abdominal pains ? Swelling of the abdomen that is new, acute ? Fever of 100?F or higher ? ? ?For urgent or emergent issues, a gastroenterologist can be reached at any hour by calling [phone]. ?Do not use MyChart messaging for urgent concerns.  ? ? ?DIET:  We do recommend a small meal at first, but then you may proceed to your regular diet.  Drink plenty of fluids but you should avoid alcoholic  beverages for 24 hours. ? ?ACTIVITY:  You should plan to take it easy for the rest of today and you should NOT DRIVE or use heavy machinery until tomorrow (because of the sedation medicines used during the test).   ? ?FOLLOW UP: ?Our staff will call the number listed on your records 48-72 hours following your procedure to check on you and address any questions or concerns that you may have regarding the information given to you following your procedure. If we do not reach you, we will leave a message.  We will attempt to reach you two times.  During this call, we will ask if you have developed any symptoms of COVID 19. If you develop any symptoms (ie: fever, flu-like symptoms, shortness of breath, cough etc.) before then, please call [phone].  If you test positive for Covid 19 in the 2 weeks post procedure, please call and report this information to [place].   ? ?If any biopsies were taken you will be contacted by phone or by letter within the next 1-3 weeks.  Please call us at [phone] if you have not heard about the biopsies in 3 weeks.  ? ? ?SIGNATURES/CONFIDENTIALITY: ?You and/or your care partner have signed paperwork which will be entered into your electronic medical record.  These signatures attest to the fact that that the information above on your After Visit Summary has been reviewed and is understood.  Full responsibility of the confidentiality of this discharge  information lies with you and/or your care-partner.  ?

## 2022-04-16 NOTE — Op Note (Signed)
[facility] ?Patient Name: Benjamin Fuller ?Procedure Date: 04/16/2022 8:49 AM ?MRN: [id number] ?Endoscopist: Meryl Dare , MD ?Age: 81 ?Referring MD:  ?Date of Birth: February 06, 1941 ?Gender: Male ?Account #: [id number] ?Procedure:                Colonoscopy ?Indications:              Surveillance: Personal history of adenomatous  ?                          polyps on last colonoscopy 3 years ago ?Medicines:                Monitored Anesthesia Care ?Procedure:                Pre-Anesthesia Assessment: ?                          - Prior to the procedure, a History and Physical  ?                          was performed, and patient medications and  ?                          allergies were reviewed. The patient's tolerance of  ?                          previous anesthesia was also reviewed. The risks  ?                          and benefits of the procedure and the sedation  ?                          options and risks were discussed with the patient.  ?                          All questions were answered, and informed consent  ?                          was obtained. Prior Anticoagulants: The patient has  ?                          taken Eliquis (apixaban), last dose was 2 days  ?                          prior to procedure. ASA Grade Assessment: III - A  ?                          patient with severe systemic disease. After  ?                          reviewing the risks and benefits, the patient was  ?                          deemed in satisfactory condition to undergo the  ?  procedure. ?                          After obtaining informed consent, the colonoscope  ?                          was passed under direct vision. Throughout the  ?                          procedure, the patient's blood pressure, pulse, and  ?                          oxygen saturations were monitored continuously. The  ?                          Colonoscope was introduced through the anus and  ?                           advanced to the the cecum, identified by  ?                          appendiceal orifice and ileocecal valve. The  ?                          ileocecal valve, appendiceal orifice, and rectum  ?                          were photographed. The quality of the bowel  ?                          preparation was good. The colonoscopy was performed  ?                          without difficulty. The patient tolerated the  ?                          procedure well. ?Scope In: 9:04:46 AM ?Scope Out: 9:21:18 AM ?Scope Withdrawal Time: 0 hours 12 minutes 25 seconds  ?Total Procedure Duration: 0 hours 16 minutes 32 seconds  ?Findings:                 The perianal and digital rectal examinations were  ?                          normal. ?                          Two sessile polyps were found in the transverse  ?                          colon. The polyps were 6 to 8 mm in size. These  ?                          polyps were removed with a cold snare. Resection  ?  and retrieval were complete. ?                          Multiple small-mouthed diverticula were found in  ?                          the left colon. There was evidence of diverticular  ?                          spasm. There was no evidence of diverticular  ?                          bleeding. ?                          Internal hemorrhoids were found during  ?                          retroflexion. The hemorrhoids were small and Grade  ?                          I (internal hemorrhoids that do not prolapse). ?                          The exam was otherwise without abnormality on  ?                          direct and retroflexion views. ?Complications:            No immediate complications. Estimated blood loss:  ?                          None. ?Estimated Blood Loss:     Estimated blood loss: none. ?Impression:               - Two 6 to 8 mm polyps in the transverse colon,  ?                          removed with a cold snare.  Resected and retrieved. ?                          - Moderate diverticulosis in the left colon. ?                          - Internal hemorrhoids. ?                          - The examination was otherwise normal on direct  ?                          and retroflexion views. ?Recommendation:           - Resume Eliquis (apixaban) in 2 days at prior  ?                          dose. Refer to managing physician for further  ?  adjustment of therapy. ?                          - Patient has a contact number available for  ?                          emergencies. The signs and symptoms of potential  ?                          delayed complications were discussed with the  ?                          patient. Return to normal activities tomorrow.  ?                          Written discharge instructions were provided to the  ?                          patient. ?                          - High fiber diet. ?                          - Continue present medications. ?                          - Await pathology results. ?                          - No repeat colonoscopy due to age. ?Meryl Dare, MD ?04/16/2022 9:25:28 AM ?This report has been signed electronically. ?

## 2022-04-18 ENCOUNTER — Telehealth: Payer: Self-pay | Admitting: *Deleted

## 2022-04-18 LAB — CUP PACEART INCLINIC DEVICE CHECK
Date Time Interrogation Session: 20230328000000
Implantable Lead Implant Date: 20221222
Implantable Lead Implant Date: 20221222
Implantable Lead Location: 753859
Implantable Lead Location: 753860
Implantable Lead Model: 7841
Implantable Lead Model: 7842
Implantable Lead Serial Number: 1102393
Implantable Lead Serial Number: 1161013
Implantable Pulse Generator Implant Date: 20221222
Lead Channel Impedance Value: 651 Ohm
Lead Channel Impedance Value: 830 Ohm
Lead Channel Pacing Threshold Amplitude: 0.5 V
Lead Channel Pacing Threshold Amplitude: 0.6 V
Lead Channel Pacing Threshold Pulse Width: 0.4 ms
Lead Channel Pacing Threshold Pulse Width: 0.4 ms
Lead Channel Sensing Intrinsic Amplitude: 4.3 mV
Lead Channel Setting Pacing Amplitude: 2.5 V
Lead Channel Setting Pacing Amplitude: 2.5 V
Lead Channel Setting Pacing Pulse Width: 0.4 ms
Lead Channel Setting Sensing Sensitivity: 2.5 mV
Pulse Gen Serial Number: 562911

## 2022-04-18 NOTE — Telephone Encounter (Signed)
?  Follow up Call- ? ? ?  04/16/2022  ?  8:38 AM  ?Call back number  ?Post procedure Call Back phone  # [phone]  ?Permission to leave phone message Yes  ?  ? ?Patient questions: ? ?Do you have a fever, pain , or abdominal swelling? No. ?Pain Score  0 * ? ?Have you tolerated food without any problems? Yes.   ? ?Have you been able to return to your normal activities? Yes.   ? ?Do you have any questions about your discharge instructions: ?Diet   No. ?Medications  No. ?Follow up visit  No. ? ?Do you have questions or concerns about your Care? No. ? ?Actions: ?* If pain score is 4 or above: ?No action needed, pain <4. ? ? ?

## 2022-04-19 ENCOUNTER — Encounter: Payer: Self-pay | Admitting: Gastroenterology

## 2022-04-19 NOTE — Progress Notes (Signed)
Remote pacemaker transmission.   

## 2022-04-30 ENCOUNTER — Ambulatory Visit: Payer: Medicare Other | Admitting: Internal Medicine

## 2022-07-04 ENCOUNTER — Ambulatory Visit (INDEPENDENT_AMBULATORY_CARE_PROVIDER_SITE_OTHER): Payer: Medicare Other

## 2022-07-04 DIAGNOSIS — I442 Atrioventricular block, complete: Secondary | ICD-10-CM

## 2022-07-07 LAB — CUP PACEART REMOTE DEVICE CHECK
Battery Remaining Longevity: 162 mo
Battery Remaining Percentage: 100 %
Brady Statistic RA Percent Paced: 4 %
Brady Statistic RV Percent Paced: 100 %
Date Time Interrogation Session: 20230705024100
Implantable Lead Implant Date: 20221222
Implantable Lead Implant Date: 20221222
Implantable Lead Location: 753859
Implantable Lead Location: 753860
Implantable Lead Model: 7841
Implantable Lead Model: 7842
Implantable Lead Serial Number: 1102393
Implantable Lead Serial Number: 1161013
Implantable Pulse Generator Implant Date: 20221222
Lead Channel Impedance Value: 655 Ohm
Lead Channel Impedance Value: 745 Ohm
Lead Channel Pacing Threshold Amplitude: 0.4 V
Lead Channel Pacing Threshold Pulse Width: 0.4 ms
Lead Channel Setting Pacing Amplitude: 2.5 V
Lead Channel Setting Pacing Amplitude: 2.5 V
Lead Channel Setting Pacing Pulse Width: 0.4 ms
Lead Channel Setting Sensing Sensitivity: 2.5 mV
Pulse Gen Serial Number: 562911

## 2022-07-09 ENCOUNTER — Emergency Department (HOSPITAL_COMMUNITY): Payer: Medicare Other

## 2022-07-09 ENCOUNTER — Other Ambulatory Visit: Payer: Self-pay

## 2022-07-09 ENCOUNTER — Emergency Department (HOSPITAL_COMMUNITY)
Admission: EM | Admit: 2022-07-09 | Discharge: 2022-07-10 | Disposition: A | Discharge status: Home / Self Care | Payer: Medicare Other | Attending: Emergency Medicine | Admitting: Emergency Medicine

## 2022-07-09 DIAGNOSIS — S0101XA Laceration without foreign body of scalp, initial encounter: Secondary | ICD-10-CM

## 2022-07-09 DIAGNOSIS — I4891 Unspecified atrial fibrillation: Secondary | ICD-10-CM | POA: Diagnosis not present

## 2022-07-09 DIAGNOSIS — S0990XA Unspecified injury of head, initial encounter: Secondary | ICD-10-CM | POA: Diagnosis present

## 2022-07-09 DIAGNOSIS — W07XXXA Fall from chair, initial encounter: Secondary | ICD-10-CM | POA: Insufficient documentation

## 2022-07-09 DIAGNOSIS — Y9269 Other specified industrial and construction area as the place of occurrence of the external cause: Secondary | ICD-10-CM | POA: Diagnosis not present

## 2022-07-09 DIAGNOSIS — Z7901 Long term (current) use of anticoagulants: Secondary | ICD-10-CM | POA: Insufficient documentation

## 2022-07-09 DIAGNOSIS — Z23 Encounter for immunization: Secondary | ICD-10-CM | POA: Insufficient documentation

## 2022-07-09 DIAGNOSIS — Y990 Civilian activity done for income or pay: Secondary | ICD-10-CM | POA: Insufficient documentation

## 2022-07-09 DIAGNOSIS — S0003XA Contusion of scalp, initial encounter: Secondary | ICD-10-CM

## 2022-07-09 LAB — CBC WITH DIFFERENTIAL/PLATELET
Abs Immature Granulocytes: 0.03 10*3/uL (ref 0.00–0.07)
Basophils Absolute: 0 10*3/uL (ref 0.0–0.1)
Basophils Relative: 1 %
Eosinophils Absolute: 0.1 10*3/uL (ref 0.0–0.5)
Eosinophils Relative: 2 %
HCT: 45.1 % (ref 39.0–52.0)
Hemoglobin: 15.1 g/dL (ref 13.0–17.0)
Immature Granulocytes: 1 %
Lymphocytes Relative: 29 %
Lymphs Abs: 1.9 10*3/uL (ref 0.7–4.0)
MCH: 31.1 pg (ref 26.0–34.0)
MCHC: 33.5 g/dL (ref 30.0–36.0)
MCV: 92.8 fL (ref 80.0–100.0)
Monocytes Absolute: 0.6 10*3/uL (ref 0.1–1.0)
Monocytes Relative: 9 %
Neutro Abs: 3.9 10*3/uL (ref 1.7–7.7)
Neutrophils Relative %: 58 %
Platelets: 167 10*3/uL (ref 150–400)
RBC: 4.86 MIL/uL (ref 4.22–5.81)
RDW: 12.6 % (ref 11.5–15.5)
WBC: 6.5 10*3/uL (ref 4.0–10.5)
nRBC: 0 % (ref 0.0–0.2)

## 2022-07-09 LAB — BASIC METABOLIC PANEL
Anion gap: 11 (ref 5–15)
BUN: 14 mg/dL (ref 8–23)
CO2: 25 mmol/L (ref 22–32)
Calcium: 9.7 mg/dL (ref 8.9–10.3)
Chloride: 100 mmol/L (ref 98–111)
Creatinine, Ser: 0.78 mg/dL (ref 0.61–1.24)
GFR, Estimated: >60 mL/min (ref >60)
Glucose, Bld: 152 mg/dL — ABNORMAL HIGH (ref 70–99)
Potassium: 4.3 mmol/L (ref 3.5–5.1)
Sodium: 136 mmol/L (ref 135–145)

## 2022-07-09 MED ORDER — TETANUS-DIPHTH-ACELL PERTUSSIS 5-2.5-18.5 LF-MCG/0.5 IM SUSY
0.5000 mL | PREFILLED_SYRINGE | Freq: Once | INTRAMUSCULAR | Status: AC
  Administered 2022-07-10: 0.5 mL via INTRAMUSCULAR
  Filled 2022-07-09: qty 0.5

## 2022-07-09 MED ORDER — TRANEXAMIC ACID FOR EPISTAXIS
500.0000 mg | Freq: Once | TOPICAL | Status: DC
  Filled 2022-07-09: qty 10

## 2022-07-09 NOTE — ED Notes (Signed)
Suture cart at bedside 

## 2022-07-09 NOTE — ED Triage Notes (Signed)
Pt arrives to ED BIB [facility] as a Level 2 Fall on thinners (Eliquis). Per EMS pt was at his work shop attempting to sit on a stool and fell backwards hitting his head on concrete. Per EMS prior to their arrival bystanders were holding pressure to affected area to control the bleeding. Pt has approximately 2in Lac to posterior head. Head is wrapped and bleeding is controlled. Pt is A/O x4, denies LOC. EDP and Trauma Team at bedside upon pt's arrival.  Hx of Afib.  BP 156/82 HR 88 O2 97% RA CBG 131

## 2022-07-09 NOTE — Progress Notes (Signed)
Orthopedic Tech Progress Note Patient Details:  TADEUSZ STAHL Dec 30, 1941 [id number]  Level 2 trauma   Patient ID: Wallis Mart, male   DOB: [birth year], 81 y.o.   MRN: [id number]  Janit Pagan 07/09/2022, 6:23 PM

## 2022-07-09 NOTE — ED Provider Notes (Signed)
[facility] EMERGENCY DEPARTMENT Provider Note   CSN: [id number] Arrival date & time: 07/09/22  1815     History  Chief Complaint  Patient presents with   Trauma    Level 2 Fall on Thinners     Benjamin Fuller is a 81 y.o. male.  HPI     81 year old male with history of A-fib on Eliquis comes in with chief complaint of fall.  Patient indicates that he was working in his workshop, and was attempting to sit on the stool when he fell backwards, striking the back of his head.  He did not lose consciousness.  He started having bleeding immediately, that EMS had to control with dressing.  Patient was found to have a laceration to his scalp.  Review of system is negative for nausea, vomiting, focal numbness or weakness, change in vision.  He denies any lower extremity pain or upper extremity pain.  No chest pain, abdominal pain or neck pain.  Home Medications Prior to Admission medications   Medication Sig Start Date End Date Taking? Authorizing Provider  apixaban (ELIQUIS) 5 MG TABS tablet TAKE 1 TABLET TWICE A DAY 01/18/22  Yes Buford Dresser, MD  dapagliflozin propanediol (FARXIGA) 10 MG TABS tablet Take 10 mg by mouth daily.   Yes [provider]  diphenhydramine-acetaminophen (TYLENOL PM) 25-500 MG TABS tablet Take 1 tablet by mouth at bedtime as needed (sleep).   Yes [provider]  ferrous gluconate (FERGON) 324 MG tablet Take 324 mg by mouth daily with breakfast.   Yes [provider]  glimepiride (AMARYL) 4 MG tablet Take 4 mg by mouth 2 (two) times daily.    Yes [provider]  metFORMIN (GLUCOPHAGE) 500 MG tablet Take 500 mg by mouth daily.   Yes [provider]  Multiple Vitamin (MULTIVITAMIN WITH MINERALS) TABS tablet Take 1 tablet by mouth daily.   Yes [provider]  pioglitazone (ACTOS) 15 MG tablet Take 15 mg by mouth daily. 11/04/20  Yes [provider]  ramipril (ALTACE) 10 MG  capsule Take 10 mg by mouth daily.   Yes [provider]  simvastatin (ZOCOR) 40 MG tablet Take 40 mg by mouth at bedtime.   Yes [provider]  sodium chloride (OCEAN) 0.65 % SOLN nasal spray Place 1 spray into both nostrils at bedtime.   Yes [provider]  tamsulosin (FLOMAX) 0.4 MG CAPS capsule Take 0.4 mg by mouth daily.   Yes [provider]  vitamin B-12 (CYANOCOBALAMIN) 1000 MCG tablet Take 1,000 mcg by mouth daily.   Yes [provider]  furosemide (LASIX) 40 MG tablet Take 1 tablet (40 mg total) by mouth daily as needed. For weight gain >5 lbs, swelling, shortness of breath 01/09/21 01/04/22  Buford Dresser, MD      Allergies    Metformin hcl and Penicillins    Review of Systems   Review of Systems  All other systems reviewed and are negative.   Physical Exam Updated Vital Signs BP 128/72   Pulse 73   Temp 97.6 F (36.4 C) (Oral)   Resp 19   Ht '5\' 6"'$  (1.676 m)   Wt 81.6 kg   SpO2 99%   BMI 29.05 kg/m  Physical Exam Vitals and nursing note reviewed.  Constitutional:      Appearance: He is well-developed.  HENT:     Head: Atraumatic.  Eyes:     Extraocular Movements: Extraocular movements intact.  Pupils: Pupils are equal, round, and reactive to light.  Neck:     Comments: No midline c-spine tenderness, pt able to turn head to 45 degrees bilaterally without any pain and able to flex neck to the chest and extend without any pain or neurologic symptoms.  Cardiovascular:     Rate and Rhythm: Normal rate.  Pulmonary:     Effort: Pulmonary effort is normal.  Musculoskeletal:        General: No tenderness or deformity.     Cervical back: Neck supple.     Comments: Head to toe evaluation shows no hematoma, bleeding of the scalp, no facial abrasions, no spine step offs, crepitus of the chest or neck, no tenderness to palpation of the bilateral upper and lower extremities, no gross deformities, no chest tenderness,  no pelvic pain.   Skin:    Comments: 9 cm laceration to the vertex/occiput with active bleeding.  Neurological:     Mental Status: He is alert and oriented to person, place, and time.     ED Results / Procedures / Treatments   Labs (all labs ordered are listed, but only abnormal results are displayed) Labs Reviewed  BASIC METABOLIC PANEL - Abnormal; Notable for the following components:      Result Value   Glucose, Bld 152 (*)    All other components within normal limits  CBC WITH DIFFERENTIAL/PLATELET    EKG None  Radiology CT Head Wo Contrast  Result Date: 07/09/2022 CLINICAL DATA:  Head trauma, minor (Age >= 65y) on thinners Fall. Fall at work shop attempting to sit on a stool falling backwards striking head on concrete. EXAM: CT HEAD WITHOUT CONTRAST TECHNIQUE: Contiguous axial images were obtained from the base of the skull through the vertex without intravenous contrast. RADIATION DOSE REDUCTION: This exam was performed according to the departmental dose-optimization program which includes automated exposure control, adjustment of the mA and/or kV according to patient size and/or use of iterative reconstruction technique. COMPARISON:  None Available. FINDINGS: Brain: No intracranial hemorrhage, mass effect, or midline shift. Normal for age atrophy. No hydrocephalus. The basilar cisterns are patent. Moderate to advanced periventricular and deep white matter hypodensity typical of chronic small vessel ischemia. No evidence of territorial infarct or acute ischemia. No extra-axial or intracranial fluid collection. Vascular: Atherosclerosis of skullbase vasculature without hyperdense vessel or abnormal calcification. Skull: Posterior scalp hematoma but no calvarial fracture. No focal bone lesion. Sinuses/Orbits: No acute fracture. Mucosal thickening of the right maxillary sinus and scattered throughout ethmoid air cells. No mastoid effusion. Bilateral cataract resection. Other: Posterior  scalp hematoma and laceration just to the left of midline IMPRESSION: 1. Posterior scalp hematoma and laceration. No acute intracranial abnormality. No calvarial fracture. 2. Age-related atrophy and chronic small vessel ischemia. Electronically Signed   By: Keith Rake M.D.   On: 07/09/2022 20:11   DG Chest Portable 1 View  Result Date: 07/09/2022 CLINICAL DATA:  Trauma, fall. EXAM: PORTABLE CHEST 1 VIEW COMPARISON:  12/21/2021 FINDINGS: Left-sided pacemaker in place. Mild chronic cardiomegaly. Aortic atherosclerosis. No pneumothorax or pleural effusion. No focal airspace disease. No pulmonary edema. No displaced fractures are seen. Lower cervical spine hardware is partially visualized. IMPRESSION: 1. No acute findings. 2. Mild chronic cardiomegaly. Electronically Signed   By: Keith Rake M.D.   On: 07/09/2022 18:28    Procedures .Marland KitchenLaceration Repair  Date/Time: 07/09/2022 11:54 PM  Performed by: Varney Biles, MD Authorized by: Varney Biles, MD   Consent:    Consent obtained:  Verbal   Consent given by:  Patient   Risks, benefits, and alternatives were discussed: yes     Risks discussed:  Infection, pain, need for additional repair, vascular damage and poor wound healing Universal protocol:    Procedure explained and questions answered to patient or proxy's satisfaction: yes     Patient identity confirmed:  Arm band Anesthesia:    Anesthesia method:  None Laceration details:    Location:  Scalp   Scalp location:  Occipital   Length (cm):  9   Depth (mm):  5 Exploration:    Limited defect created (wound extended): yes     Hemostasis achieved with:  Direct pressure   Imaging outcome: foreign body not noted     Wound exploration: wound explored through full range of motion and entire depth of wound visualized     Contaminated: no   Treatment:    Area cleansed with:  Soap and water   Amount of cleaning:  Extensive   Irrigation solution:  Sterile water   Irrigation  volume:  100   Irrigation method:  Pressure wash   Debridement:  None   Undermining:  None Skin repair:    Repair method:  Staples   Number of staples:  11 Approximation:    Approximation:  Close Repair type:    Repair type:  Complex Post-procedure details:    Dressing:  Adhesive bandage and sterile dressing   Procedure completion:  Tolerated well, no immediate complications .Critical Care  Performed by: Varney Biles, MD Authorized by: Varney Biles, MD   Critical care provider statement:    Critical care time (minutes):  30   Critical care was necessary to treat or prevent imminent or life-threatening deterioration of the following conditions:  Trauma   Critical care was time spent personally by me on the following activities:  Development of treatment plan with patient or surrogate, discussions with consultants, evaluation of patient's response to treatment, examination of patient, ordering and review of laboratory studies, ordering and review of radiographic studies, ordering and performing treatments and interventions, pulse oximetry, re-evaluation of patient's condition and review of old charts (Trauma, fall on Coumadin with complex laceration, with complication of heavy bleeding)     Medications Ordered in ED Medications  tranexamic acid (CYKLOKAPRON) 1000 MG/10ML topical solution 500 mg (500 mg Topical Not Given 07/09/22 2331)  Tdap (BOOSTRIX) injection 0.5 mL (has no administration in time range)    ED Course/ Medical Decision Making/ A&P                           Medical Decision Making Amount and/or Complexity of Data Reviewed Labs: ordered. Radiology: ordered.  Risk Prescription drug management.   This patient presents to the ED with chief complaint(s) of head trauma with pertinent past medical history of A-fib on Eliquis which further complicates the presenting complaint. The complaint involves an extensive differential diagnosis and also carries with it a  high risk of complications and morbidity.    The differential diagnosis includes : Complex laceration to the scalp, hematoma, subdural hematoma, subarachnoid hemorrhage. C-spine has been cleared clinically, patient will not require CT C-spine based on my assessment. Patient's torso and extremity evaluation are normal.  X-ray of the chest ordered to ensure there is no pneumothorax.  Additional history obtained: Additional history obtained from EMS  Records reviewed  patient's medications  Independent labs interpretation:  The following labs were independently interpreted: Patient has normal hemoglobin.  Independent visualization of imaging: - I independently visualized the following imaging with scope of interpretation limited to determining acute life threatening conditions related to emergency care: X-ray of the chest and CT scan of the brain, which revealed which revealed no evidence of brain bleed, no pneumothorax  Treatment and Reassessment: After patient's initial work-up, we evaluated the wound more thoroughly.  He was having active arterial bleed.  Multiple staples were applied, hemostasis achieved.  Quick clot gauze has been applied to the wound.  Patient has been assessed in the ER for further time to ensure there is no additional bleeding.  He appears hemodynamically stable and has no other complaints.   Final Clinical Impression(s) / ED Diagnoses Final diagnoses:  Scalp laceration, initial encounter  Hematoma of occipital region of scalp    Rx / DC Orders ED Discharge Orders     None         Varney Biles, MD 07/10/22 0001

## 2022-07-09 NOTE — Progress Notes (Signed)
Chaplain responded to this level II fall on thinners.  Patient arrived from home.  EMT indicated spouse was not coming at this time and will await call from medical team.  Patient being evaluated and unavailable at this time.  Chaplain available for patient/family support as needed. [place], Mdiv.     07/09/22 1857  Clinical Encounter Type  Visited With Patient not available;Health care provider  Visit Type Initial;ED;Trauma  Referral From Nurse  Consult/Referral To Chaplain

## 2022-07-09 NOTE — ED Notes (Signed)
Bleeding noted on the sheets behind the pt's head that wasn't there previously, Old dressing removed, bleeding remained controlled and a new dressing applied.

## 2022-07-10 DIAGNOSIS — S0101XA Laceration without foreign body of scalp, initial encounter: Secondary | ICD-10-CM | POA: Diagnosis not present

## 2022-07-10 NOTE — Discharge Instructions (Signed)
You were seen in the ER for bleeding after a fall.  CT scan of your brain is reassuring.  You had a large laceration that was repaired with staples.  There was fair amount of bleeding, but it did respond to the treatment in the emergency room  If you start having heavy bleeding again, please return to the ER immediately.  The staples will need to be removed in 7 days.  Call your PCP for a follow-up to have the staples removed.  Urgent care or ER can also do the same in case PCP is unable to help.  We had put a quick clot gauze to your wound site.  Please keep the dressing on for at least 24 hours before removing it.  You may start your Eliquis on Wednesday if there is no bleeding.

## 2022-07-25 NOTE — Progress Notes (Signed)
Remote pacemaker transmission.   

## 2022-08-08 ENCOUNTER — Other Ambulatory Visit: Payer: Self-pay | Admitting: Cardiology

## 2022-08-08 DIAGNOSIS — I4819 Other persistent atrial fibrillation: Secondary | ICD-10-CM

## 2022-08-08 DIAGNOSIS — Z7901 Long term (current) use of anticoagulants: Secondary | ICD-10-CM

## 2022-08-09 NOTE — Telephone Encounter (Signed)
Please review Eliquis for refills. Thank you!

## 2022-08-09 NOTE — Telephone Encounter (Signed)
Eliquis '5mg'$  refill request received. Patient is 81 years old, weight-81.6kg, Crea-0.78 on 07/09/22, Diagnosis-Afib, and last seen by Dr. Lovena Le on 03/27/2022. Dose is appropriate based on dosing criteria. Will send in refill to requested pharmacy.

## 2022-09-03 IMAGING — CT CT HEART MORPH/PULM VEIN W/ CM & W/O CA SCORE
1 series · 6 of 8 positions shown, 8 images · non-contrast
Comparison: None.
COMPARISON: None.

Addendum:
EXAM:
OVER-READ INTERPRETATION  CT CHEST

The following report is an over-read performed by radiologist Dr.
Jhemboy Padam [REDACTED] on 03/24/2021. This
over-read does not include interpretation of cardiac or coronary
anatomy or pathology. The coronary calcium score/coronary CTA
interpretation by the cardiologist is attached.
CLINICAL DATA: Atrial fibrillation scheduled for an ablation.
Cardiac CT/CTA
TECHNIQUE: The patient was scanned on a Siemens Somatom scanner.

[Series 537: pulmonary veins · 0.28mm/px · 6 of 8 slices shown, 8 images]
[im 2/8  vessel]
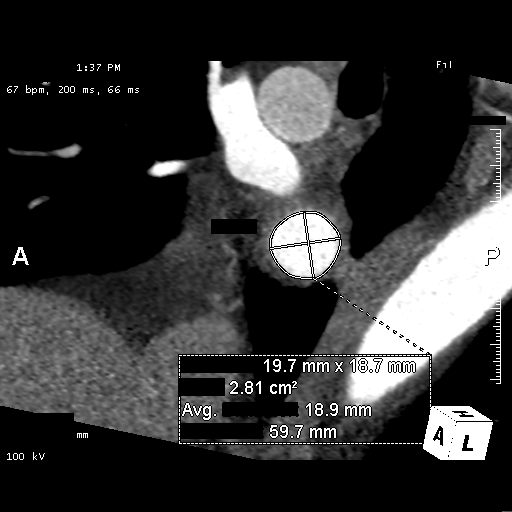
[im 2/8  lung]
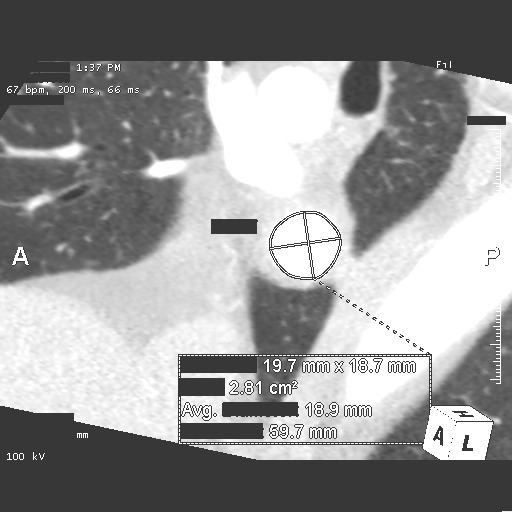
[im 3/8  vessel]
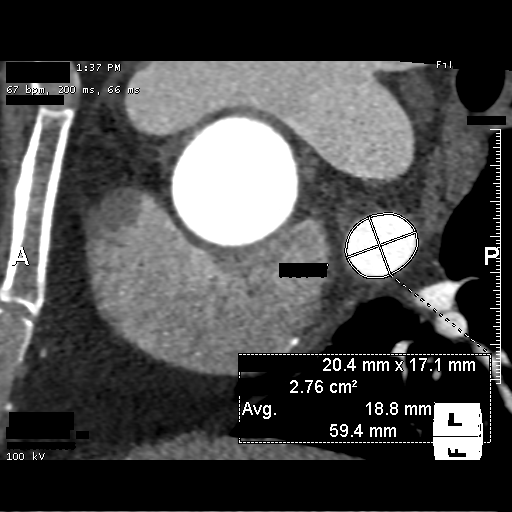
[im 4/8  vessel]
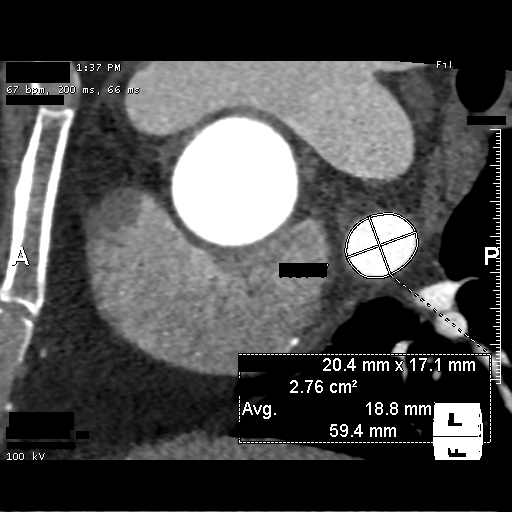
[im 5/8  vessel]
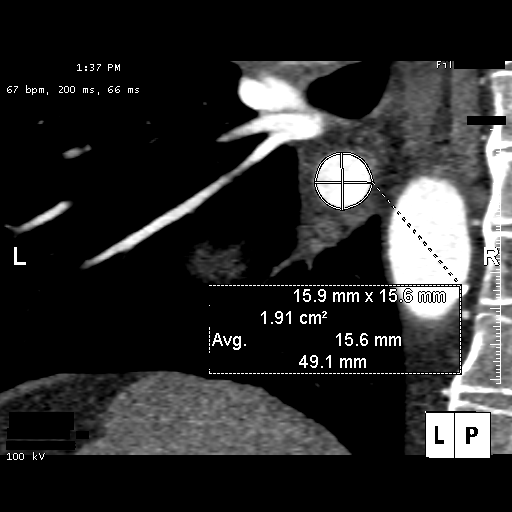
[im 6/8  vessel]
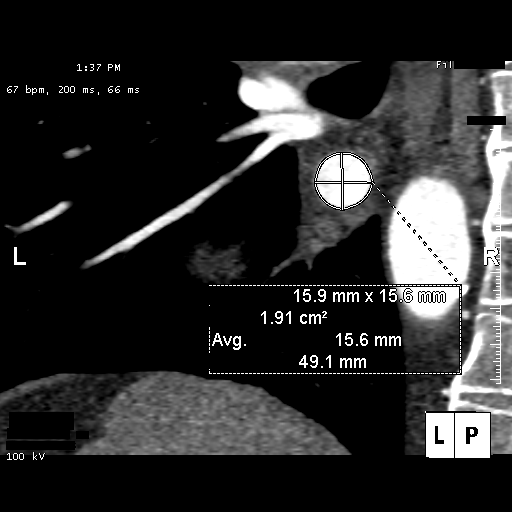
[im 6/8  lung]
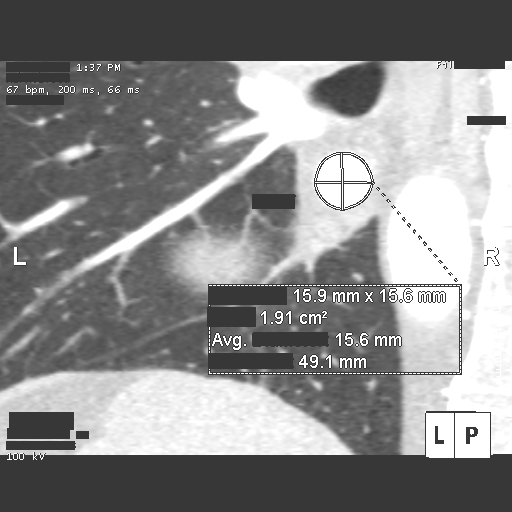
[im 7/8  vessel]
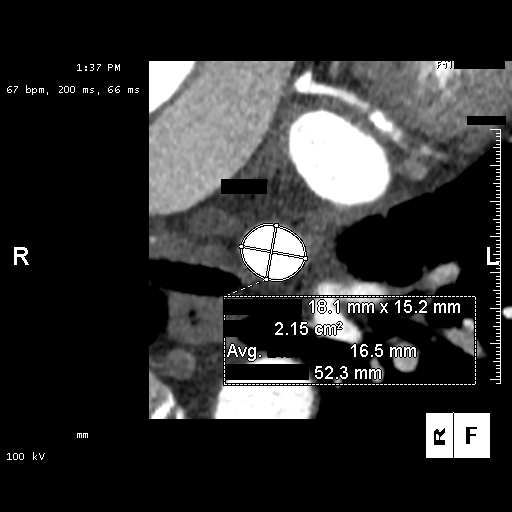

[6 of 8 positions shown; findings below may reference images not displayed]

FINDINGS: Within the visualized portions of the thorax there are no suspicious
appearing pulmonary nodules or masses, there is no acute
consolidative airspace disease, no pleural effusions, no
pneumothorax and no lymphadenopathy. Visualized portions of the
upper abdomen are unremarkable. There are no aggressive appearing
lytic or blastic lesions noted in the visualized portions of the
skeleton.
IMPRESSION: No significant incidental noncardiac findings are noted.
FINDINGS: A 120 kV prospective scan was triggered in the descending thoracic
aorta at 111 HU's. Gantry rotation speed was 280 msecs and
collimation was .9 mm. No beta blockade and no NTG was given. The 3D
data set was reconstructed in 5% intervals of the R-R cycle. Phases
were analyzed on a dedicated work station using MPR, MIP and VRT
modes. The patient received 80 cc of contrast.

Left atrium: Mild enlargement No thrombus in the left atrium or left
atrial appendage.

Left ventricle: Normal in size.

Right atrium: Mild enlargement.

Right ventricle:  Normal in size.

Pericardium: Normal thickness

Pulmonary veins:

Left superior pulmonary vein: Measures 1.8 x 1.5 cm in diameter with
area 2.2 cm^2.

Left inferior pulmonary vein: Measures 1.6 x 1.6 cm in diameter with
area 1.9 cm^2.

Right superior pulmonary vein: Measures 2.0 x 1.7 cm in diameter
with area 2.8 cm^2.

Right inferior pulmonary vein: Measures 2.0 cm x 1.9 cm in diameter
with area 2.8 cm^2.

Pulmonary arteries: Dilated measures 35mm

Coronary Arteries: Normal coronary origin. Right dominance. The
study was performed without use of NTG and insufficient for plaque
evaluation. Calcium score 353 (50th percentile)

Esophagus: Courses posterior to body of left atrium
IMPRESSION: 1. There is normal pulmonary vein drainage into the left atrium.
Measurements as reported

2. There is no thrombus in the left atrial appendage.

3. The esophagus runs in the left atrial midline and is not in the
proximity to any of the pulmonary veins.

4.  Coronary calcium score 353 (50th percentile)

5.  Dilated main pulmonary artery measuring 35mm

*** End of Addendum ***
EXAM:
OVER-READ INTERPRETATION  CT CHEST

The following report is an over-read performed by radiologist Dr.
Jhemboy Padam [REDACTED] on 03/24/2021. This
over-read does not include interpretation of cardiac or coronary
anatomy or pathology. The coronary calcium score/coronary CTA
interpretation by the cardiologist is attached.
FINDINGS: Within the visualized portions of the thorax there are no suspicious
appearing pulmonary nodules or masses, there is no acute
consolidative airspace disease, no pleural effusions, no
pneumothorax and no lymphadenopathy. Visualized portions of the
upper abdomen are unremarkable. There are no aggressive appearing
lytic or blastic lesions noted in the visualized portions of the
skeleton.
IMPRESSION: No significant incidental noncardiac findings are noted.

## 2022-09-17 ENCOUNTER — Encounter (HOSPITAL_BASED_OUTPATIENT_CLINIC_OR_DEPARTMENT_OTHER): Payer: Self-pay | Admitting: Cardiology

## 2022-09-17 ENCOUNTER — Ambulatory Visit (INDEPENDENT_AMBULATORY_CARE_PROVIDER_SITE_OTHER): Payer: Medicare Other | Admitting: Cardiology

## 2022-09-17 VITALS — BP 128/72 | HR 72 | Ht 65.0 in | Wt 172.6 lb

## 2022-09-17 DIAGNOSIS — I480 Paroxysmal atrial fibrillation: Secondary | ICD-10-CM

## 2022-09-17 DIAGNOSIS — R001 Bradycardia, unspecified: Secondary | ICD-10-CM | POA: Diagnosis not present

## 2022-09-17 DIAGNOSIS — Z7901 Long term (current) use of anticoagulants: Secondary | ICD-10-CM

## 2022-09-17 DIAGNOSIS — I10 Essential (primary) hypertension: Secondary | ICD-10-CM

## 2022-09-17 DIAGNOSIS — D6869 Other thrombophilia: Secondary | ICD-10-CM

## 2022-09-17 DIAGNOSIS — Z950 Presence of cardiac pacemaker: Secondary | ICD-10-CM | POA: Diagnosis not present
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
## 2022-09-17 NOTE — Patient Instructions (Signed)

## 2022-10-03 ENCOUNTER — Ambulatory Visit (INDEPENDENT_AMBULATORY_CARE_PROVIDER_SITE_OTHER): Payer: Medicare Other

## 2022-10-03 DIAGNOSIS — I442 Atrioventricular block, complete: Secondary | ICD-10-CM

## 2022-10-03 LAB — CUP PACEART REMOTE DEVICE CHECK
Battery Remaining Longevity: 156 mo
Battery Remaining Percentage: 100 %
Brady Statistic RA Percent Paced: 5 %
Brady Statistic RV Percent Paced: 100 %
Date Time Interrogation Session: 20231004024100
Implantable Lead Implant Date: 20221222
Implantable Lead Implant Date: 20221222
Implantable Lead Location: 753859
Implantable Lead Location: 753860
Implantable Lead Model: 7841
Implantable Lead Model: 7842
Implantable Lead Serial Number: 1102393
Implantable Lead Serial Number: 1161013
Implantable Pulse Generator Implant Date: 20221222
Lead Channel Impedance Value: 657 Ohm
Lead Channel Impedance Value: 724 Ohm
Lead Channel Pacing Threshold Amplitude: 0.4 V
Lead Channel Pacing Threshold Pulse Width: 0.4 ms
Lead Channel Setting Pacing Amplitude: 2.5 V
Lead Channel Setting Pacing Amplitude: 2.5 V
Lead Channel Setting Pacing Pulse Width: 0.4 ms
Lead Channel Setting Sensing Sensitivity: 2.5 mV
Pulse Gen Serial Number: 562911

## 2022-10-12 NOTE — Progress Notes (Signed)
Remote pacemaker transmission.   

## 2023-01-02 ENCOUNTER — Ambulatory Visit (INDEPENDENT_AMBULATORY_CARE_PROVIDER_SITE_OTHER): Payer: Medicare PPO

## 2023-01-02 DIAGNOSIS — I442 Atrioventricular block, complete: Secondary | ICD-10-CM | POA: Diagnosis not present

## 2023-01-02 LAB — CUP PACEART REMOTE DEVICE CHECK
Battery Remaining Longevity: 156 mo
Battery Remaining Percentage: 100 %
Brady Statistic RA Percent Paced: 5 %
Brady Statistic RV Percent Paced: 100 %
Date Time Interrogation Session: 20240103024200
Implantable Lead Connection Status: 753985
Implantable Lead Connection Status: 753985
Implantable Lead Implant Date: 20221222
Implantable Lead Implant Date: 20221222
Implantable Lead Location: 753859
Implantable Lead Location: 753860
Implantable Lead Model: 7841
Implantable Lead Model: 7842
Implantable Lead Serial Number: 1102393
Implantable Lead Serial Number: 1161013
Implantable Pulse Generator Implant Date: 20221222
Lead Channel Impedance Value: 635 Ohm
Lead Channel Impedance Value: 731 Ohm
Lead Channel Pacing Threshold Amplitude: 0.5 V
Lead Channel Pacing Threshold Pulse Width: 0.4 ms
Lead Channel Setting Pacing Amplitude: 2.5 V
Lead Channel Setting Pacing Amplitude: 2.5 V
Lead Channel Setting Pacing Pulse Width: 0.4 ms
Lead Channel Setting Sensing Sensitivity: 2.5 mV
Pulse Gen Serial Number: 562911
Zone Setting Status: 755011

## 2023-01-28 NOTE — Progress Notes (Signed)
Remote pacemaker transmission.   

## 2023-03-25 ENCOUNTER — Ambulatory Visit (HOSPITAL_BASED_OUTPATIENT_CLINIC_OR_DEPARTMENT_OTHER): Payer: Medicare PPO | Admitting: Cardiology

## 2023-03-25 ENCOUNTER — Encounter (HOSPITAL_BASED_OUTPATIENT_CLINIC_OR_DEPARTMENT_OTHER): Payer: Self-pay | Admitting: Cardiology

## 2023-03-25 VITALS — BP 128/60 | HR 77 | Ht 65.0 in | Wt 176.9 lb

## 2023-03-25 DIAGNOSIS — E782 Mixed hyperlipidemia: Secondary | ICD-10-CM

## 2023-03-25 DIAGNOSIS — I480 Paroxysmal atrial fibrillation: Secondary | ICD-10-CM

## 2023-03-25 DIAGNOSIS — E119 Type 2 diabetes mellitus without complications: Secondary | ICD-10-CM

## 2023-03-25 DIAGNOSIS — D6869 Other thrombophilia: Secondary | ICD-10-CM

## 2023-03-25 DIAGNOSIS — Z7901 Long term (current) use of anticoagulants: Secondary | ICD-10-CM

## 2023-03-25 DIAGNOSIS — I10 Essential (primary) hypertension: Secondary | ICD-10-CM

## 2023-03-25 DIAGNOSIS — I442 Atrioventricular block, complete: Secondary | ICD-10-CM

## 2023-03-25 DIAGNOSIS — Z950 Presence of cardiac pacemaker: Secondary | ICD-10-CM | POA: Diagnosis not present

## 2023-03-25 NOTE — Patient Instructions (Signed)
Medication Instructions:  The current medical regimen is effective;  continue present plan and medications.   *If you need a refill on your cardiac medications before your next appointment, please call your pharmacy*   Lab Work: None  Testing/Procedures: None   Follow-Up: At [facility], you and your health needs are our priority.  As part of our continuing mission to provide you with exceptional heart care, we have created designated Provider Care Teams.  These Care Teams include your primary Cardiologist (physician) and Advanced Practice Providers (APPs -  Physician Assistants and Nurse Practitioners) who all work together to provide you with the care you need, when you need it.  We recommend signing up for the patient portal called "MyChart".  Sign up information is provided on this After Visit Summary.  MyChart is used to connect with patients for Virtual Visits (Telemedicine).  Patients are able to view lab/test results, encounter notes, upcoming appointments, etc.  Non-urgent messages can be sent to your provider as well.   To learn more about what you can do with MyChart, go to [email or web address].    Your next appointment:   6 month(s)  Provider:   Bridgette Christopher, MD    Other Instructions None  
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
## 2023-04-03 ENCOUNTER — Ambulatory Visit (INDEPENDENT_AMBULATORY_CARE_PROVIDER_SITE_OTHER): Payer: Medicare PPO

## 2023-04-03 DIAGNOSIS — I442 Atrioventricular block, complete: Secondary | ICD-10-CM

## 2023-04-03 LAB — CUP PACEART REMOTE DEVICE CHECK
Battery Remaining Longevity: 150 mo
Battery Remaining Percentage: 100 %
Brady Statistic RA Percent Paced: 5 %
Brady Statistic RV Percent Paced: 100 %
Date Time Interrogation Session: 20240403024200
Implantable Lead Connection Status: 753985
Implantable Lead Connection Status: 753985
Implantable Lead Implant Date: 20221222
Implantable Lead Implant Date: 20221222
Implantable Lead Location: 753859
Implantable Lead Location: 753860
Implantable Lead Model: 7841
Implantable Lead Model: 7842
Implantable Lead Serial Number: 1102393
Implantable Lead Serial Number: 1161013
Implantable Pulse Generator Implant Date: 20221222
Lead Channel Impedance Value: 599 Ohm
Lead Channel Impedance Value: 701 Ohm
Lead Channel Pacing Threshold Amplitude: 0.5 V
Lead Channel Pacing Threshold Pulse Width: 0.4 ms
Lead Channel Setting Pacing Amplitude: 2.5 V
Lead Channel Setting Pacing Amplitude: 2.5 V
Lead Channel Setting Pacing Pulse Width: 0.4 ms
Lead Channel Setting Sensing Sensitivity: 2.5 mV
Pulse Gen Serial Number: 562911
Zone Setting Status: 755011

## 2023-05-10 NOTE — Progress Notes (Signed)
Remote pacemaker transmission.   

## 2023-05-15 ENCOUNTER — Other Ambulatory Visit: Payer: Self-pay | Admitting: Internal Medicine

## 2023-05-15 DIAGNOSIS — Z7901 Long term (current) use of anticoagulants: Secondary | ICD-10-CM

## 2023-05-15 DIAGNOSIS — I4819 Other persistent atrial fibrillation: Secondary | ICD-10-CM

## 2023-05-15 NOTE — Telephone Encounter (Signed)
Eliquis 5mg  refill request received. Patient is 82 years old, weight-80.2kg, Crea-0.78 on 07/09/22, Diagnosis-Afib, and last seen by Dr. Cristal Deer on 03/25/23. Dose is appropriate based on dosing criteria. Will send in refill to requested pharmacy.

## 2023-06-02 IMAGING — CR DG CHEST 2V
2 series · 2 of 2 positions shown · non-contrast
Comparison: None

CLINICAL DATA: Pacemaker insertion

EXAM:
CHEST - 2 VIEW

[chest lat]
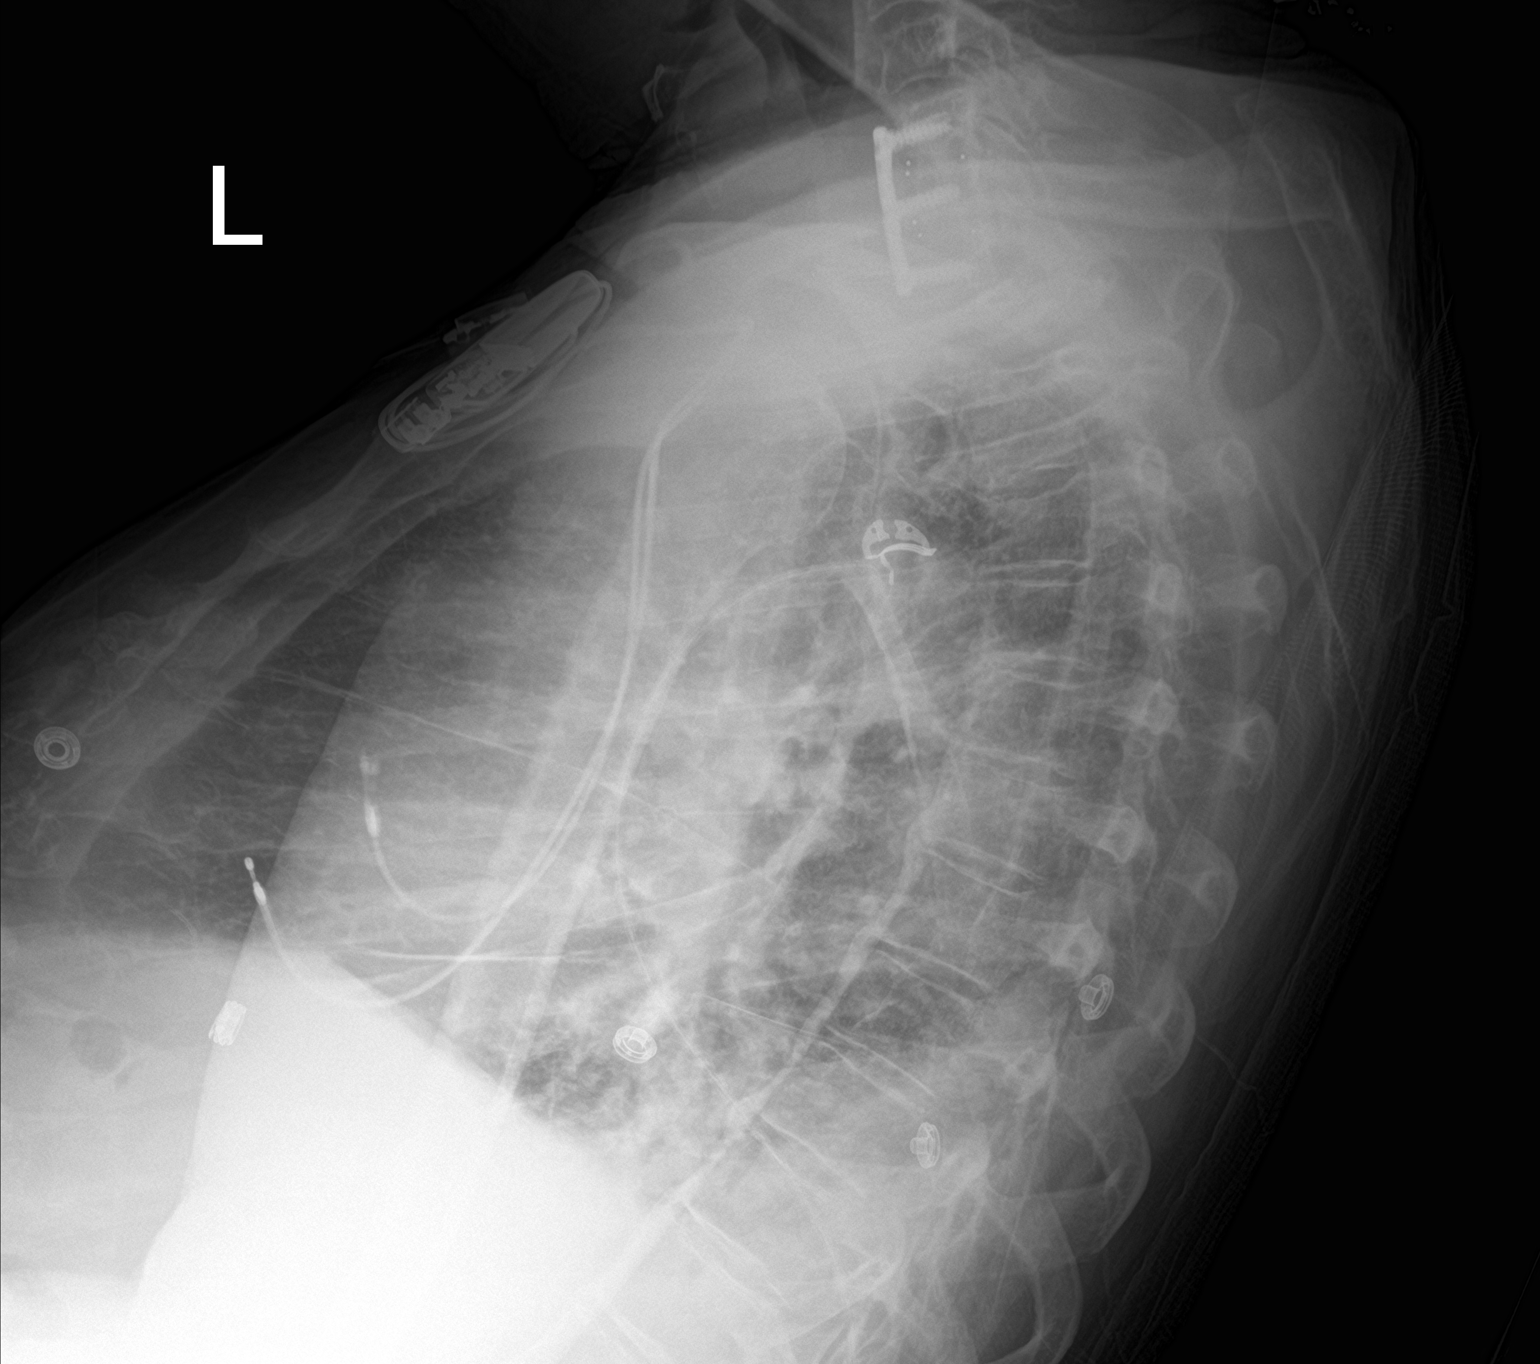

[chest ap]
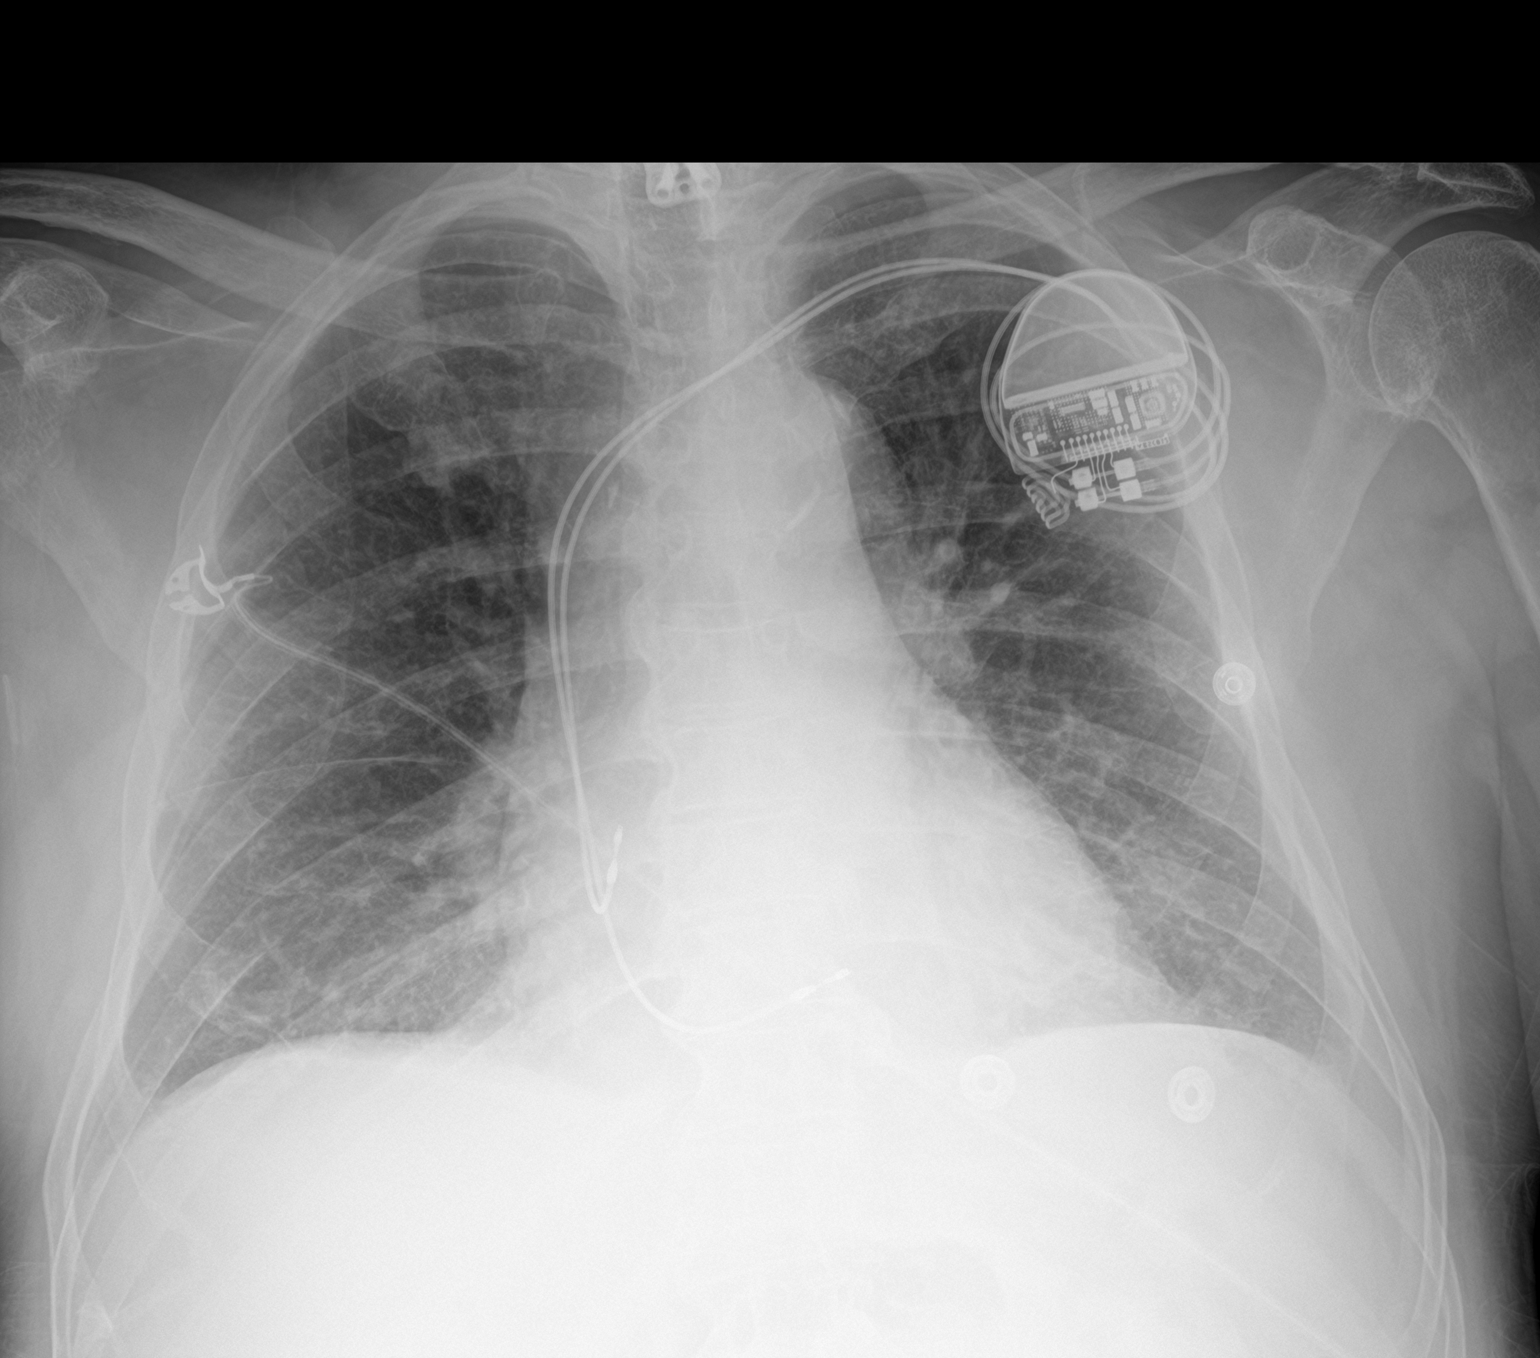

[2 of 2 positions shown; findings below may reference images not displayed]

FINDINGS: LEFT subclavian sequential pacemaker with leads projecting at RIGHT
atrium and RIGHT ventricle.

Upper normal heart size.

Mediastinal contours and pulmonary vascularity normal.

Atherosclerotic calcification aorta.

Bibasilar atelectasis versus infiltrate on lateral view.

Remaining lungs clear.

No pleural effusion or pneumothorax.

Prior cervical spine fusion with diffuse idiopathic skeletal
hyperostosis of thoracic spine.
IMPRESSION: New pacemaker without pneumothorax.

Bibasilar atelectasis versus infiltrate.

## 2023-07-03 ENCOUNTER — Telehealth: Payer: Self-pay

## 2023-07-03 ENCOUNTER — Ambulatory Visit (INDEPENDENT_AMBULATORY_CARE_PROVIDER_SITE_OTHER): Payer: Medicare PPO

## 2023-07-03 ENCOUNTER — Ambulatory Visit: Payer: Medicare PPO | Attending: Internal Medicine

## 2023-07-03 ENCOUNTER — Ambulatory Visit (INDEPENDENT_AMBULATORY_CARE_PROVIDER_SITE_OTHER): Payer: Medicare PPO | Admitting: Internal Medicine

## 2023-07-03 VITALS — BP 134/68 | HR 70 | Ht 65.0 in | Wt 180.8 lb

## 2023-07-03 DIAGNOSIS — I480 Paroxysmal atrial fibrillation: Secondary | ICD-10-CM | POA: Diagnosis not present

## 2023-07-03 DIAGNOSIS — I442 Atrioventricular block, complete: Secondary | ICD-10-CM

## 2023-07-03 LAB — CUP PACEART REMOTE DEVICE CHECK
Battery Remaining Longevity: 126 mo
Battery Remaining Percentage: 100 %
Brady Statistic RA Percent Paced: 7 %
Brady Statistic RV Percent Paced: 100 %
Date Time Interrogation Session: 20240703024100
Implantable Lead Connection Status: 753985
Implantable Lead Connection Status: 753985
Implantable Lead Implant Date: 20221222
Implantable Lead Implant Date: 20221222
Implantable Lead Location: 753859
Implantable Lead Location: 753860
Implantable Lead Model: 7841
Implantable Lead Model: 7842
Implantable Lead Serial Number: 1102393
Implantable Lead Serial Number: 1161013
Implantable Pulse Generator Implant Date: 20221222
Lead Channel Impedance Value: 641 Ohm
Lead Channel Impedance Value: 693 Ohm
Lead Channel Setting Pacing Amplitude: 2.5 V
Lead Channel Setting Pacing Amplitude: 2.5 V
Lead Channel Setting Pacing Pulse Width: 0.4 ms
Lead Channel Setting Sensing Sensitivity: 2.5 mV
Pulse Gen Serial Number: 562911
Zone Setting Status: 755011

## 2023-07-03 LAB — CUP PACEART INCLINIC DEVICE CHECK
Date Time Interrogation Session: 20240703141338
Implantable Lead Connection Status: 753985
Implantable Lead Connection Status: 753985
Implantable Lead Implant Date: 20221222
Implantable Lead Implant Date: 20221222
Implantable Lead Location: 753859
Implantable Lead Location: 753860
Implantable Lead Model: 7841
Implantable Lead Model: 7842
Implantable Lead Serial Number: 1102393
Implantable Lead Serial Number: 1161013
Implantable Pulse Generator Implant Date: 20221222
Lead Channel Impedance Value: 667 Ohm
Lead Channel Impedance Value: 708 Ohm
Lead Channel Sensing Intrinsic Amplitude: 0.8 mV
Lead Channel Setting Pacing Amplitude: 2.5 V
Lead Channel Setting Pacing Amplitude: 2.5 V
Lead Channel Setting Pacing Pulse Width: 0.4 ms
Lead Channel Setting Sensing Sensitivity: 2.5 mV
Pulse Gen Serial Number: 562911
Zone Setting Status: 755011

## 2023-07-03 NOTE — Patient Instructions (Signed)
Lab Work: BMET, BNP, TSH, CBC ---Today If you have labs (blood work) drawn today and your tests are completely normal, you will receive your results only by: MyChart Message (if you have MyChart) OR A paper copy in the mail If you have any lab test that is abnormal or we need to change your treatment, we will call you to review the results.   Testing/Procedures:    Follow-Up: At [facility], you and your health needs are our priority.  As part of our continuing mission to provide you with exceptional heart care, we have created designated Provider Care Teams.  These Care Teams include your primary Cardiologist (physician) and Advanced Practice Providers (APPs -  Physician Assistants and Nurse Practitioners) who all work together to provide you with the care you need, when you need it.  We recommend signing up for the patient portal called "MyChart".  Sign up information is provided on this After Visit Summary.  MyChart is used to connect with patients for Virtual Visits (Telemedicine).  Patients are able to view lab/test results, encounter notes, upcoming appointments, etc.  Non-urgent messages can be sent to your provider as well.   To learn more about what you can do with MyChart, go to [email or web address].    Your next appointment:   2 week(s)  Provider:   Jari Favre, PA-C, Ronie Spies, PA-C, Robin Searing, NP, Jacolyn Reedy, PA-C, Eligha Bridegroom, NP, or Tereso Newcomer, PA-C       Other Instructions    Dear [name]  You are scheduled for a Cardioversion on Tuesday, July 9 with Dr.Turner  Please arrive at the [facility] (Main Entrance A) at [facility]: [address] [place], [place] [address] at 7:00 AM (This time is 1 hour(s) before your procedure to ensure your preparation). Free valet parking service is available. You will check in at ADMITTING. The support person will be asked to wait in the waiting room.  It is OK to have someone drop you off and  come back when you are ready to be discharged.    DIET:  Nothing to eat or drink after midnight except a sip of water with medications (see medication instructions below)  MEDICATION INSTRUCTIONS: !!IF ANY NEW MEDICATIONS ARE STARTED AFTER TODAY, PLEASE NOTIFY YOUR PROVIDER AS SOON AS POSSIBLE!!  FYI: Medications such as Semaglutide (Ozempic, [place]), Tirzepatide (Mounjaro, Zepbound), Dulaglutide (Trulicity), etc ("GLP1 agonists") AND Canagliflozin (Invokana), Dapagliflozin (Farxiga), Empagliflozin (Jardiance), Ertugliflozin (Steglatro), Bexagliflozin [employer]) or any combination with one of these drugs such as Invokamet (Canagliflozin/Metformin), Synjardy (Empagliflozin/Metformin), etc ("SGLT2 inhibitors") must be held around the time of a procedure. This is not a comprehensive list of all of these drugs. Please review all of your medications and talk to your provider if you take any one of these. If you are not sure, ask your provider.          :1}HOLD:  Do not take oral diabetic medications the morning of procedure.       :1}Continue taking your anticoagulant (blood thinner): Apixaban (Eliquis).  You will need to continue this after your procedure until you are told by your provider that it is safe to stop.    LABS:  Today  FYI:  For your safety, and to allow [place] to monitor your vital signs accurately during the surgery/procedure we request: If you have artificial nails, gel coating, SNS etc, please have those removed prior to your surgery/procedure. Not having the nail coverings /polish removed may  result in cancellation or delay of your surgery/procedure.  You must have a responsible person to drive you home and stay in the waiting area during your procedure. Failure to do so could result in cancellation.  Bring your insurance cards.  *Special Note: Every effort is made to have your procedure done on time. Occasionally there are emergencies that occur at the hospital that may cause  delays. Please be patient if a delay does occur.
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
## 2023-07-03 NOTE — Telephone Encounter (Signed)
Alert received from [employer]:  Scheduled remote reviewed. Normal device function.   10 ATR's 1-13sec in duration, undersensing of AF, controlled rates, Eliquis per EPIC Presenting appear undersensing of AF - route to triage  Presenting rhythm-afib with atrial undersensing.    Outreach made to Pt.  Per Pt he has not felt great over the last 2 weeks.  He will come in for device check adjustment-afib plan.

## 2023-07-03 NOTE — Patient Instructions (Signed)
Will schedule remote check for 08/03/2023 post cardioversion.
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
## 2023-07-03 NOTE — Progress Notes (Signed)
Pt seen in device clinic d/t atrial undersensing. Afib waves measured 0.4-0.8 mV. Reprogrammed atrial sensivity to 0.25 mV. After reprogramming-afib is noted on presenting rhythm.  Per Pt-he has noted increased SOB over the last 2 weeks which correlate with beginning of decreased measured P waves indicating start of afib episode.  Pt will see DOD to discuss.

## 2023-07-04 LAB — CBC
Hematocrit: 40.3 % (ref 37.5–51.0)
Hemoglobin: 13.5 g/dL (ref 13.0–17.7)
MCH: 31.3 pg (ref 26.6–33.0)
MCHC: 33.5 g/dL (ref 31.5–35.7)
MCV: 94 fL (ref 79–97)
Platelets: 177 10*3/uL (ref 150–450)
RBC: 4.31 x10E6/uL (ref 4.14–5.80)
RDW: 12.3 % (ref 11.6–15.4)
WBC: 9.3 10*3/uL (ref 3.4–10.8)

## 2023-07-04 LAB — BASIC METABOLIC PANEL
BUN/Creatinine Ratio: 22 (ref 10–24)
BUN: 19 mg/dL (ref 8–27)
CO2: 21 mmol/L (ref 20–29)
Calcium: 9.5 mg/dL (ref 8.6–10.2)
Chloride: 98 mmol/L (ref 96–106)
Creatinine, Ser: 0.86 mg/dL (ref 0.76–1.27)
Glucose: 242 mg/dL — ABNORMAL HIGH (ref 70–99)
Potassium: 4.8 mmol/L (ref 3.5–5.2)
Sodium: 135 mmol/L (ref 134–144)
eGFR: 87 mL/min/{1.73_m2} (ref >59)

## 2023-07-04 LAB — TSH: TSH: 2.37 u[IU]/mL (ref 0.450–4.500)

## 2023-07-04 LAB — PRO B NATRIURETIC PEPTIDE: NT-Pro BNP: 1006 pg/mL — ABNORMAL HIGH (ref 0–486)

## 2023-07-08 NOTE — Anesthesia Preprocedure Evaluation (Signed)
Anesthesia Evaluation  Patient identified by MRN, date of birth, ID band Patient awake    Reviewed: Allergy & Precautions, NPO status , Patient's Chart, lab work & pertinent test results, reviewed documented beta blocker date and time   Airway Mallampati: I  TM Distance: >3 FB Neck ROM: Full    Dental no notable dental hx. (+) Teeth Intact, Caps   Pulmonary former smoker   Pulmonary exam normal breath sounds clear to auscultation       Cardiovascular hypertension, Pt. on medications + dysrhythmias Atrial Fibrillation + pacemaker  Rhythm:Regular Rate:Normal  LBBB LVEF 60-65% in 2022  EKG 7/9/ Ventricular paced rhythm 70/min   Neuro/Psych negative neurological ROS  negative psych ROS   GI/Hepatic negative GI ROS, Neg liver ROS,,,  Endo/Other  diabetes, Poorly Controlled, Type 2, Oral Hypoglycemic Agents  Hyperlipidemia  Renal/GU negative Renal ROS  negative genitourinary   Musculoskeletal  (+) Arthritis , Osteoarthritis,    Abdominal   Peds  Hematology  (+) Blood dyscrasia, anemia Eliquis therapy   Anesthesia Other Findings   Reproductive/Obstetrics                             Anesthesia Physical Anesthesia Plan  ASA: 3  Anesthesia Plan: General   Post-op Pain Management: Minimal or no pain anticipated   Induction: Intravenous  PONV Risk Score and Plan: Treatment may vary due to age or medical condition  Airway Management Planned: Natural Airway  Additional Equipment: None  Intra-op Plan:   Post-operative Plan:   Informed Consent: I have reviewed the patients History and Physical, chart, labs and discussed the procedure including the risks, benefits and alternatives for the proposed anesthesia with the patient or authorized representative who has indicated his/her understanding and acceptance.       Plan Discussed with: CRNA  Anesthesia Plan Comments: (Patient in Vent  paced rhythm with underlying Atrial fibrillation)        Anesthesia Quick Evaluation

## 2023-07-08 NOTE — Pre-Procedure Instructions (Signed)
Spoke to patient's wife on phone regarding cardioversion tomorrow:  Instructed to arrive at 6:45 am, NPO after midnight.  Confirmed patient has a ride home and responsible person to stay with patient for 24 hours after the procedure  Confirmed no missed doses of Eliquis and instructed patient's wife for him to take morning of surgery with a sip of water. Pt's wife instructed for patient to stop [place].    Take BP meds in the AM with a sip of water

## 2023-07-09 ENCOUNTER — Ambulatory Visit (HOSPITAL_COMMUNITY): Payer: Medicare PPO | Admitting: Anesthesiology

## 2023-07-09 ENCOUNTER — Ambulatory Visit (HOSPITAL_BASED_OUTPATIENT_CLINIC_OR_DEPARTMENT_OTHER): Payer: Medicare PPO | Admitting: Anesthesiology

## 2023-07-09 ENCOUNTER — Ambulatory Visit (HOSPITAL_COMMUNITY)
Admission: RE | Admit: 2023-07-09 | Discharge: 2023-07-09 | Disposition: A | Discharge status: Home / Self Care | Payer: Medicare PPO | Source: Home / Self Care | Attending: Cardiology | Admitting: Cardiology

## 2023-07-09 ENCOUNTER — Other Ambulatory Visit: Payer: Self-pay

## 2023-07-09 ENCOUNTER — Encounter (HOSPITAL_COMMUNITY)
Admission: RE | Disposition: A | Discharge status: Home / Self Care | Payer: Self-pay | Source: Home / Self Care | Attending: Cardiology

## 2023-07-09 DIAGNOSIS — E119 Type 2 diabetes mellitus without complications: Secondary | ICD-10-CM | POA: Diagnosis not present

## 2023-07-09 DIAGNOSIS — I4891 Unspecified atrial fibrillation: Secondary | ICD-10-CM | POA: Diagnosis not present

## 2023-07-09 DIAGNOSIS — I442 Atrioventricular block, complete: Secondary | ICD-10-CM | POA: Insufficient documentation

## 2023-07-09 DIAGNOSIS — Z79899 Other long term (current) drug therapy: Secondary | ICD-10-CM | POA: Insufficient documentation

## 2023-07-09 DIAGNOSIS — Z7984 Long term (current) use of oral hypoglycemic drugs: Secondary | ICD-10-CM | POA: Diagnosis not present

## 2023-07-09 DIAGNOSIS — Z87891 Personal history of nicotine dependence: Secondary | ICD-10-CM | POA: Diagnosis not present

## 2023-07-09 DIAGNOSIS — E785 Hyperlipidemia, unspecified: Secondary | ICD-10-CM

## 2023-07-09 DIAGNOSIS — I10 Essential (primary) hypertension: Secondary | ICD-10-CM | POA: Diagnosis not present

## 2023-07-09 DIAGNOSIS — Z950 Presence of cardiac pacemaker: Secondary | ICD-10-CM | POA: Diagnosis not present

## 2023-07-09 DIAGNOSIS — I480 Paroxysmal atrial fibrillation: Secondary | ICD-10-CM | POA: Diagnosis present

## 2023-07-09 HISTORY — PX: CARDIOVERSION: SHX1299

## 2023-07-09 SURGERY — CARDIOVERSION
Anesthesia: General

## 2023-07-09 MED ORDER — LIDOCAINE 2% (20 MG/ML) 5 ML SYRINGE
INTRAMUSCULAR | Status: DC | PRN
  Administered 2023-07-09: 50 mg via INTRAVENOUS

## 2023-07-09 MED ORDER — PROPOFOL 10 MG/ML IV BOLUS
INTRAVENOUS | Status: DC | PRN
  Administered 2023-07-09: 50 mg via INTRAVENOUS

## 2023-07-09 SURGICAL SUPPLY — 1 items: ELECT DEFIB PAD ADLT CADENCE (PAD) ×1 IMPLANT

## 2023-07-09 NOTE — Interval H&P Note (Signed)
History and Physical Interval Note:  07/09/2023 7:53 AM  Benjamin Fuller  has presented today for surgery, with the diagnosis of AFIB.  The various methods of treatment have been discussed with the patient and family. After consideration of risks, benefits and other options for treatment, the patient has consented to  Procedure(s): CARDIOVERSION (N/A) as a surgical intervention.  The patient's history has been reviewed, patient examined, no change in status, stable for surgery.  I have reviewed the patient's chart and labs.  Questions were answered to the patient's satisfaction.     Armanda Magic

## 2023-07-09 NOTE — Discharge Instructions (Signed)

## 2023-07-09 NOTE — Transfer of Care (Signed)
Immediate Anesthesia Transfer of Care Note  Patient: Benjamin Fuller  Procedure(s) Performed: CARDIOVERSION  Patient Location: Cath Lab  Anesthesia Type:General  Level of Consciousness: drowsy and patient cooperative  Airway & Oxygen Therapy: Patient Spontanous Breathing and Patient connected to nasal cannula oxygen  Post-op Assessment: Report given to RN and Post -op Vital signs reviewed and stable  Post vital signs: Reviewed and stable  Last Vitals:  Vitals Value Taken Time  BP 111/66 07/09/23 0815  Temp    Pulse 69 07/09/23 0816  Resp 20 07/09/23 0816  SpO2 88 % 07/09/23 0816    Last Pain: There were no vitals filed for this visit.       Complications: No notable events documented.

## 2023-07-09 NOTE — Anesthesia Postprocedure Evaluation (Signed)
Anesthesia Post Note  Patient: Benjamin Fuller  Procedure(s) Performed: CARDIOVERSION     Patient location during evaluation: PACU Anesthesia Type: General Level of consciousness: awake and alert and oriented Pain management: pain level controlled Vital Signs Assessment: post-procedure vital signs reviewed and stable Respiratory status: spontaneous breathing, nonlabored ventilation and respiratory function stable Cardiovascular status: blood pressure returned to baseline and stable Postop Assessment: no apparent nausea or vomiting Anesthetic complications: no   No notable events documented.  Last Vitals:  Vitals:   07/09/23 0821 07/09/23 0822  BP:  117/66  Pulse:  63  Resp:  20  Temp: 36.7 C   SpO2:  98%    Last Pain:  Vitals:   07/09/23 0821  TempSrc: Temporal  PainSc: 0-No pain                 Silva Aamodt A.

## 2023-07-09 NOTE — CV Procedure (Addendum)
Electrical Cardioversion Procedure Note Benjamin Fuller [id number] 12/26/1941  Procedure: Electrical Cardioversion Indications:  Atrial Fibrillation  Time Out: Verified patient identification, verified procedure,medications/allergies/relevent history reviewed, required imaging and test results available.  Performed  Procedure Details  The patient was NPO after midnight. Anesthesia was administered at the beside  by Dr.Foster with 50mg  of propofol and 50mg  of Lidocaine.  Cardioversion was done with synchronized biphasic defibrillation with AP pads with 150watts.  The patient converted to normal sinus rhythm. The patient tolerated the procedure well   IMPRESSION:  Successful cardioversion of atrial fibrillation to AV paced rhythm.    Arrow Tomko 07/09/2023, 7:53 AM

## 2023-07-10 ENCOUNTER — Encounter (HOSPITAL_COMMUNITY): Payer: Self-pay | Admitting: Cardiology
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
## 2023-07-15 ENCOUNTER — Ambulatory Visit: Payer: Medicare PPO | Attending: Family Medicine

## 2023-07-15 DIAGNOSIS — R2689 Other abnormalities of gait and mobility: Secondary | ICD-10-CM | POA: Insufficient documentation

## 2023-07-15 DIAGNOSIS — M5459 Other low back pain: Secondary | ICD-10-CM | POA: Diagnosis present

## 2023-07-15 DIAGNOSIS — M545 Low back pain, unspecified: Secondary | ICD-10-CM | POA: Diagnosis present

## 2023-07-15 DIAGNOSIS — R293 Abnormal posture: Secondary | ICD-10-CM | POA: Insufficient documentation

## 2023-07-15 DIAGNOSIS — R278 Other lack of coordination: Secondary | ICD-10-CM | POA: Diagnosis present

## 2023-07-22 NOTE — Progress Notes (Unsigned)
Cardiology Office Note:  .   Date:  07/23/2023  ID:  Benjamin Fuller, DOB August 22, 1941, MRN [id number] PCP: Soundra Pilon, FNP  [facility] Providers Cardiologist:  Jodelle Red, MD Electrophysiologist:  Hillis Range, MD (Inactive) {  History of Present Illness: .   Benjamin Fuller is a 82 y.o. 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  Was last seen 07/03/2023 by Dr. Tenny Craw.  At that time had a remote check since he was not feeling good for the past 2 weeks.  Found to be in atrial fibrillation that the pacer appeared to be under sensing this.  Symptoms of fatigue started at the time he went into A-fib.  At that time, denies chest pain, shortness of breath, severe dizziness, fatigue. Underwent DCCV 07/09/23.   Today, he tells me he has gotten rid of most of the fluid (about 10 lbs).  He is going to see physical therapy.  He has not needed Lasix for a while.  He took 2 doses initially and has not needed any since.  Sometimes his feet feel tight at night (likely dependent edema).  He is now on Jardiance 25 mg daily instead of Farxiga due to insurance reasons.  He has an appointment with Dr. Cristal Deer in September that he would like to keep.  He has not had any significant bleeding on Eliquis but does not progress falls with bruising.  Heart rate is now in the 70s and he feels much better compared to before when it was in the 60s.  We discussed that he will be due for another lipid panel in December with his PCP.  Working on getting his A1c under 7.  Reports no shortness of breath nor dyspnea on exertion. Reports no chest pain, pressure, or tightness. No edema, orthopnea, PND. Reports no palpitations.   ROS: Pertinent ROS in HPI  Studies Reviewed: .       Echocardiogram 03/29/2021 IMPRESSIONS     1. Left ventricular ejection fraction, by estimation, is 60 to 65%. The  left  ventricle has normal function. The left ventricle has no regional  wall motion abnormalities. There is mild concentric left ventricular  hypertrophy. Left ventricular diastolic  function could not be evaluated. Elevated left ventricular end-diastolic  pressure.   2. Right ventricular systolic function is normal. The right ventricular  size is normal. There is normal pulmonary artery systolic pressure. The  estimated right ventricular systolic pressure is 28.0 mmHg.   3. The mitral valve is normal in structure. Mild mitral valve  regurgitation. No evidence of mitral stenosis.   4. The aortic valve is normal in structure. Aortic valve regurgitation is  not visualized. No aortic stenosis is present.   5. Aortic dilatation noted. There is mild dilatation of the aortic root,  measuring 38 mm.   6. The inferior vena cava is normal in size with greater than 50%  respiratory variability, suggesting right atrial pressure of 3 mmHg.   FINDINGS   Left Ventricle: Left ventricular ejection fraction, by estimation, is 60  to 65%. The left ventricle has normal function. The left ventricle has no  regional wall motion abnormalities. The left ventricular internal cavity  size was normal in size. There is   mild concentric left ventricular hypertrophy. Left ventricular diastolic  function could not be evaluated due to atrial fibrillation. Left  ventricular diastolic function could not be evaluated.  Elevated left  ventricular end-diastolic pressure.   Right Ventricle: The right ventricular size is normal. No increase in  right ventricular wall thickness. Right ventricular systolic function is  normal. There is normal pulmonary artery systolic pressure. The tricuspid  regurgitant velocity is 2.50 m/s, and   with an assumed right atrial pressure of 3 mmHg, the estimated right  ventricular systolic pressure is 28.0 mmHg.   Left Atrium: Left atrial size was normal in size.   Right Atrium: Right atrial  size was normal in size.   Pericardium: There is no evidence of pericardial effusion.   Mitral Valve: The mitral valve is normal in structure. Mild mitral valve  regurgitation. No evidence of mitral valve stenosis.   Tricuspid Valve: The tricuspid valve is normal in structure. Tricuspid  valve regurgitation is mild . No evidence of tricuspid stenosis.   Aortic Valve: The aortic valve is normal in structure. Aortic valve  regurgitation is not visualized. No aortic stenosis is present.   Pulmonic Valve: The pulmonic valve was normal in structure. Pulmonic valve  regurgitation is trivial. No evidence of pulmonic stenosis.   Aorta: Aortic dilatation noted. There is mild dilatation of the aortic  root, measuring 38 mm.   Venous: The inferior vena cava is normal in size with greater than 50%  respiratory variability, suggesting right atrial pressure of 3 mmHg.   IAS/Shunts: No atrial level shunt detected by color flow Doppler.  Risk Assessment/Calculations:    CHA2DS2-VASc Score = 4   This indicates a 4.8% annual risk of stroke. The patient's score is based upon: CHF History: 0 HTN History: 1 Diabetes History: 1 Stroke History: 0 Vascular Disease History: 0 Age Score: 2 Gender Score: 0       Physical Exam:   VS:  BP 130/62   Pulse 78   Ht 5\' 5"  (1.651 m)   Wt 171 lb 12.8 oz (77.9 kg)   SpO2 98%   BMI 28.59 kg/m    Wt Readings from Last 3 Encounters:  07/23/23 171 lb 12.8 oz (77.9 kg)  07/03/23 180 lb 12.8 oz (82 kg)  03/25/23 176 lb 14.4 oz (80.2 kg)    GEN: Well nourished, well developed in no acute distress NECK: No JVD; No carotid bruits CARDIAC: RRR, no murmurs, rubs, gallops RESPIRATORY:  Clear to auscultation without rales, wheezing or rhonchi  ABDOMEN: Soft, non-tender, non-distended EXTREMITIES:  No edema; No deformity   ASSESSMENT AND PLAN: .   1.  PAF -Status post DCCV, normal sinus rhythm today -Remains on Eliquis 5 mg p.o. daily -No significant  bleeding -Continue current medications which include Jardiance 25 mg daily, Lasix 40 mg as needed for fluid overload, Zocor 40mg  at bedtime, and Eliquis 5 mg twice a day  2.  History of heart block status post PPM -Device recently interrogated  3.  Hypertension -Blood pressure well-controlled today -Continue to track blood pressure -Continue current medication treatment  4.  Type 2 diabetes mellitus -A1c 7.2 -Working with primary care to decrease -Eating sugar-free/low-carb foods -Trying to exercise more regularly  5.  Hyperlipidemia -Continue simvastatin 40 mg at bedtime -Primary care will recheck lipid panel in December -Continue lipid-lowering diet      Dispo: Follow-up with Dr. Cristal Deer as planned in September  Signed, Sharlene Dory, [place]

## 2023-07-23 ENCOUNTER — Ambulatory Visit: Payer: Medicare PPO | Admitting: Physical Therapy

## 2023-07-23 ENCOUNTER — Encounter: Payer: Self-pay | Admitting: Physician Assistant

## 2023-07-23 ENCOUNTER — Ambulatory Visit: Payer: Medicare PPO | Attending: Physician Assistant | Admitting: Physician Assistant

## 2023-07-23 VITALS — BP 130/62 | HR 78 | Ht 65.0 in | Wt 171.8 lb

## 2023-07-23 DIAGNOSIS — R2689 Other abnormalities of gait and mobility: Secondary | ICD-10-CM

## 2023-07-23 DIAGNOSIS — I10 Essential (primary) hypertension: Secondary | ICD-10-CM

## 2023-07-23 DIAGNOSIS — E785 Hyperlipidemia, unspecified: Secondary | ICD-10-CM | POA: Diagnosis not present

## 2023-07-23 DIAGNOSIS — E119 Type 2 diabetes mellitus without complications: Secondary | ICD-10-CM

## 2023-07-23 DIAGNOSIS — I4819 Other persistent atrial fibrillation: Secondary | ICD-10-CM

## 2023-07-23 DIAGNOSIS — I442 Atrioventricular block, complete: Secondary | ICD-10-CM

## 2023-07-23 DIAGNOSIS — Z7984 Long term (current) use of oral hypoglycemic drugs: Secondary | ICD-10-CM

## 2023-07-23 DIAGNOSIS — R293 Abnormal posture: Secondary | ICD-10-CM

## 2023-07-23 DIAGNOSIS — M545 Low back pain, unspecified: Secondary | ICD-10-CM

## 2023-07-23 DIAGNOSIS — Z950 Presence of cardiac pacemaker: Secondary | ICD-10-CM

## 2023-07-23 DIAGNOSIS — M5459 Other low back pain: Secondary | ICD-10-CM

## 2023-07-23 DIAGNOSIS — R001 Bradycardia, unspecified: Secondary | ICD-10-CM

## 2023-07-23 NOTE — Patient Instructions (Signed)
Medication Instructions:    Your physician recommends that you continue on your current medications as directed. Please refer to the Current Medication list given to you today.    *If you need a refill on your cardiac medications before your next appointment, please call your pharmacy*   Lab Work: [place]    If you have labs (blood work) drawn today and your tests are completely normal, you will receive your results only by: [place] (if you have MyChart) OR A paper copy in the mail If you have any lab test that is abnormal or we need to change your treatment, we will call you to review the results.   Testing/Procedures: NONE ORDERED  TODAY      Follow-Up: At [facility], you and your health needs are our priority.  As part of our continuing mission to provide you with exceptional heart care, we have created designated Provider Care Teams.  These Care Teams include your primary Cardiologist (physician) and Advanced Practice Providers (APPs -  Physician Assistants and Nurse Practitioners) who all work together to provide you with the care you need, when you need it.  We recommend signing up for the patient portal called "MyChart".  Sign up information is provided on this After Visit Summary.  MyChart is used to connect with patients for Virtual Visits (Telemedicine).  Patients are able to view lab/test results, encounter notes, upcoming appointments, etc.  Non-urgent messages can be sent to your provider as well.   To learn more about what you can do with MyChart, go to [email or web address].    Your next appointment:   AS SCHEDULED  Provider:   Buford Dresser, MD     Other Instructions
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
## 2023-07-25 ENCOUNTER — Ambulatory Visit: Payer: Medicare PPO | Admitting: Physical Therapy

## 2023-07-25 ENCOUNTER — Encounter: Payer: Self-pay | Admitting: Physical Therapy

## 2023-07-25 DIAGNOSIS — R293 Abnormal posture: Secondary | ICD-10-CM

## 2023-07-25 DIAGNOSIS — M5459 Other low back pain: Secondary | ICD-10-CM

## 2023-07-25 DIAGNOSIS — R278 Other lack of coordination: Secondary | ICD-10-CM

## 2023-07-25 DIAGNOSIS — M545 Low back pain, unspecified: Secondary | ICD-10-CM

## 2023-07-25 DIAGNOSIS — R2689 Other abnormalities of gait and mobility: Secondary | ICD-10-CM
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
## 2023-07-25 NOTE — Progress Notes (Signed)
Remote pacemaker transmission.   

## 2023-07-30 ENCOUNTER — Ambulatory Visit: Payer: Medicare PPO | Admitting: Physical Therapy

## 2023-07-30 DIAGNOSIS — R293 Abnormal posture: Secondary | ICD-10-CM

## 2023-07-30 DIAGNOSIS — M545 Low back pain, unspecified: Secondary | ICD-10-CM | POA: Diagnosis not present

## 2023-07-30 DIAGNOSIS — M5459 Other low back pain: Secondary | ICD-10-CM
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
## 2023-08-01 ENCOUNTER — Ambulatory Visit: Payer: Medicare PPO | Attending: Family Medicine

## 2023-08-01 DIAGNOSIS — R2689 Other abnormalities of gait and mobility: Secondary | ICD-10-CM | POA: Insufficient documentation

## 2023-08-01 DIAGNOSIS — R278 Other lack of coordination: Secondary | ICD-10-CM | POA: Diagnosis present

## 2023-08-01 DIAGNOSIS — M545 Low back pain, unspecified: Secondary | ICD-10-CM | POA: Diagnosis present

## 2023-08-01 DIAGNOSIS — R293 Abnormal posture: Secondary | ICD-10-CM | POA: Insufficient documentation

## 2023-08-01 DIAGNOSIS — M5459 Other low back pain: Secondary | ICD-10-CM | POA: Insufficient documentation

## 2023-08-02 ENCOUNTER — Ambulatory Visit: Payer: Medicare PPO

## 2023-08-02 DIAGNOSIS — I442 Atrioventricular block, complete: Secondary | ICD-10-CM

## 2023-08-02 LAB — CUP PACEART REMOTE DEVICE CHECK
Battery Remaining Longevity: 132 mo
Battery Remaining Percentage: 100 %
Brady Statistic RA Percent Paced: 11 %
Brady Statistic RV Percent Paced: 100 %
Date Time Interrogation Session: 20240802024200
Implantable Lead Connection Status: 753985
Implantable Lead Connection Status: 753985
Implantable Lead Implant Date: 20221222
Implantable Lead Implant Date: 20221222
Implantable Lead Location: 753859
Implantable Lead Location: 753860
Implantable Lead Model: 7841
Implantable Lead Model: 7842
Implantable Lead Serial Number: 1102393
Implantable Lead Serial Number: 1161013
Implantable Pulse Generator Implant Date: 20221222
Lead Channel Impedance Value: 670 Ohm
Lead Channel Impedance Value: 698 Ohm
Lead Channel Pacing Threshold Amplitude: 0.4 V
Lead Channel Pacing Threshold Pulse Width: 0.4 ms
Lead Channel Setting Pacing Amplitude: 2.5 V
Lead Channel Setting Pacing Amplitude: 2.5 V
Lead Channel Setting Pacing Pulse Width: 0.4 ms
Lead Channel Setting Sensing Sensitivity: 2.5 mV
Pulse Gen Serial Number: 562911
Zone Setting Status: 755011

## 2023-08-06 ENCOUNTER — Ambulatory Visit: Payer: Medicare PPO | Admitting: Physical Therapy

## 2023-08-06 DIAGNOSIS — R293 Abnormal posture: Secondary | ICD-10-CM

## 2023-08-06 DIAGNOSIS — M545 Low back pain, unspecified: Secondary | ICD-10-CM

## 2023-08-06 DIAGNOSIS — M5459 Other low back pain: Secondary | ICD-10-CM
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
## 2023-08-08 ENCOUNTER — Ambulatory Visit: Payer: Medicare PPO | Admitting: Physical Therapy

## 2023-08-08 DIAGNOSIS — M545 Low back pain, unspecified: Secondary | ICD-10-CM | POA: Diagnosis not present

## 2023-08-08 DIAGNOSIS — R293 Abnormal posture: Secondary | ICD-10-CM

## 2023-08-08 DIAGNOSIS — M5459 Other low back pain: Secondary | ICD-10-CM
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
## 2023-08-12 NOTE — Progress Notes (Signed)
Remote pacemaker transmission.   

## 2023-08-13 ENCOUNTER — Ambulatory Visit: Payer: Medicare PPO | Admitting: Physical Therapy

## 2023-08-13 DIAGNOSIS — R293 Abnormal posture: Secondary | ICD-10-CM

## 2023-08-13 DIAGNOSIS — M545 Low back pain, unspecified: Secondary | ICD-10-CM

## 2023-08-13 DIAGNOSIS — M5459 Other low back pain: Secondary | ICD-10-CM
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
## 2023-08-15 ENCOUNTER — Ambulatory Visit: Payer: Medicare PPO

## 2023-08-15 DIAGNOSIS — M5459 Other low back pain: Secondary | ICD-10-CM

## 2023-08-15 DIAGNOSIS — M545 Low back pain, unspecified: Secondary | ICD-10-CM | POA: Diagnosis not present

## 2023-08-15 DIAGNOSIS — R278 Other lack of coordination: Secondary | ICD-10-CM

## 2023-08-15 DIAGNOSIS — R293 Abnormal posture: Secondary | ICD-10-CM

## 2023-08-15 DIAGNOSIS — R2689 Other abnormalities of gait and mobility: Secondary | ICD-10-CM
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
## 2023-08-22 ENCOUNTER — Ambulatory Visit: Payer: Medicare PPO

## 2023-08-22 DIAGNOSIS — R2689 Other abnormalities of gait and mobility: Secondary | ICD-10-CM

## 2023-08-22 DIAGNOSIS — M5459 Other low back pain: Secondary | ICD-10-CM

## 2023-08-22 DIAGNOSIS — M545 Low back pain, unspecified: Secondary | ICD-10-CM | POA: Diagnosis not present

## 2023-08-22 DIAGNOSIS — R278 Other lack of coordination: Secondary | ICD-10-CM

## 2023-08-22 DIAGNOSIS — R293 Abnormal posture: Secondary | ICD-10-CM

## 2023-08-30 ENCOUNTER — Ambulatory Visit: Payer: Medicare PPO | Admitting: Physical Therapy

## 2023-08-30 DIAGNOSIS — R293 Abnormal posture: Secondary | ICD-10-CM

## 2023-08-30 DIAGNOSIS — M545 Low back pain, unspecified: Secondary | ICD-10-CM | POA: Diagnosis not present

## 2023-08-30 DIAGNOSIS — M5459 Other low back pain: Secondary | ICD-10-CM
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
## 2023-09-04 ENCOUNTER — Encounter (HOSPITAL_BASED_OUTPATIENT_CLINIC_OR_DEPARTMENT_OTHER): Payer: Self-pay | Admitting: Cardiology

## 2023-09-04 ENCOUNTER — Ambulatory Visit (HOSPITAL_BASED_OUTPATIENT_CLINIC_OR_DEPARTMENT_OTHER): Payer: Medicare PPO | Admitting: Cardiology

## 2023-09-04 VITALS — BP 126/70 | HR 63 | Ht 65.0 in | Wt 171.6 lb

## 2023-09-04 DIAGNOSIS — D6869 Other thrombophilia: Secondary | ICD-10-CM

## 2023-09-04 DIAGNOSIS — Z950 Presence of cardiac pacemaker: Secondary | ICD-10-CM | POA: Diagnosis not present

## 2023-09-04 DIAGNOSIS — E782 Mixed hyperlipidemia: Secondary | ICD-10-CM | POA: Diagnosis not present

## 2023-09-04 DIAGNOSIS — Z7901 Long term (current) use of anticoagulants: Secondary | ICD-10-CM | POA: Diagnosis not present

## 2023-09-04 DIAGNOSIS — I480 Paroxysmal atrial fibrillation: Secondary | ICD-10-CM

## 2023-09-04 DIAGNOSIS — I442 Atrioventricular block, complete: Secondary | ICD-10-CM | POA: Diagnosis not present

## 2023-09-04 DIAGNOSIS — I10 Essential (primary) hypertension: Secondary | ICD-10-CM | POA: Diagnosis not present

## 2023-09-04 NOTE — Patient Instructions (Signed)
Medication Instructions:  The current medical regimen is effective;  continue present plan and medications.   *If you need a refill on your cardiac medications before your next appointment, please call your pharmacy*   Lab Work: None  Testing/Procedures: None   Follow-Up: At [facility], you and your health needs are our priority.  As part of our continuing mission to provide you with exceptional heart care, we have created designated Provider Care Teams.  These Care Teams include your primary Cardiologist (physician) and Advanced Practice Providers (APPs -  Physician Assistants and Nurse Practitioners) who all work together to provide you with the care you need, when you need it.  We recommend signing up for the patient portal called "MyChart".  Sign up information is provided on this After Visit Summary.  MyChart is used to connect with patients for Virtual Visits (Telemedicine).  Patients are able to view lab/test results, encounter notes, upcoming appointments, etc.  Non-urgent messages can be sent to your provider as well.   To learn more about what you can do with MyChart, go to [email or web address].    Your next appointment:   6 month(s)  Provider:   Bridgette Christopher, MD    Other Instructions None  

## 2023-09-04 NOTE — Progress Notes (Signed)
Cardiology Office Note:  .    Date:  09/04/2023  ID:  Benjamin Fuller, DOB 08-21-1941, MRN [id number] PCP: Soundra Pilon, FNP  [facility] Providers Cardiologist:  Jodelle Red, MD Electrophysiologist:  Hillis Range, MD (Inactive)     History of Present Illness: .    Benjamin Fuller is a 82 y.o. 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 I initially saw him 01/06/20 as a new consult at the request of Soundra Pilon, FNP for the evaluation and management of atrial fibrillation.   CV history: On 12/21/21 Benjamin Fuller underwent successful implantation of a [employer] Sci dual-chamber pacemaker for symptomatic bradycardia due to complete heart block.   At his visit 03/2023, he was doing well with stable shortness of breath. He denied any arrhythmias or fluid retention. Diarrhea was improved on extended release metformin. On 07/03/2023 he was seen by Dr. Tenny Craw and was found to be in Afib. He had been feeling unwell and fatigued for 2 weeks. He subsequently underwent DCCV 07/09/2023. Seen by Jari Favre, PA-C 07/23/2023 where he was feeling well and maintaining sinus rhythm. Blood pressure was well controlled.  Today, he states he is feeling better. There have been no further arrhythmic episodes since his cardioversion. His blood pressure is well controlled in the office at 126/70.  Of note, he was found to be retaining fluid when he was seen by Dr. Tenny Craw. He had noticed that his feet were feeling tight at the end of the day, but he was surprised at his weight at that visit. Lately he has been more consistent with monitoring his weight at home.  He complains of some tightness in his legs, back, and hips. Of note, his tightness seems to be distinctly musculoskeletal, different from how he feels when in Afib.  Recently exacerbated after completing a grocery shopping trip. He has  been participating in physical therapy for this which seems to be helping.  He denies any palpitations, chest pain, shortness of breath, lightheadedness, headaches, syncope, orthopnea, or PND.  ROS:  Please see the history of present illness. ROS otherwise negative except as noted.  (+) Muscular fatigue/tightness of legs, hips, and back  Studies Reviewed: Marland Kitchen         Physical Exam:    VS:  There were no vitals taken for this visit.   Wt Readings from Last 3 Encounters:  07/23/23 171 lb 12.8 oz (77.9 kg)  07/03/23 180 lb 12.8 oz (82 kg)  03/25/23 176 lb 14.4 oz (80.2 kg)    GEN: Well nourished, well developed in no acute distress HEENT: Normal, moist mucous membranes NECK: No JVD CARDIAC: regular rhythm, normal S1 and S2, no rubs or gallops. No murmur. VASCULAR: Radial and DP pulses 2+ bilaterally. No carotid bruits RESPIRATORY:  Clear to auscultation without rales, wheezing or rhonchi  ABDOMEN: Soft, non-tender, non-distended MUSCULOSKELETAL:  Ambulates independently SKIN: Warm and dry, no edema NEUROLOGIC:  Alert and oriented x 3. No focal neuro deficits noted. PSYCHIATRIC:  Normal affect   ASSESSMENT AND PLAN: .    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  -echo as above -feels much better in sinus rhythm, aim for rhythm control strategy -symptoms much improved with pacemaker implant, can add back nodal agent if needed in the future   Hypertension:  at goal -continue ramipril 10 mg daily -has lasix PRN, reviewed when to use this   Hyperlipidemia, mixed: Last LDL 72 per [facility], on simvastatin. Tolerating well, no change to therapy at this time.   Type II diabetes: on empagliflozin, glimepiride, pioglitazone and metformin. -if fluid retention becomes an issue, would stop pioglitazone based on FDA warning   CV risk and primary prevention: -recommend heart  healthy/Mediterranean diet, with whole grains, fruits, vegetable, fish, lean meats, nuts, and olive oil. Limit salt. -recommend moderate walking, 3-5 times/week for 30-50 minutes each session. Aim for at least 150 minutes.week. Goal should be pace of 3 miles/hours, or walking 1.5 miles in 30 minutes -recommend avoidance of tobacco products. Avoid excess alcohol.   Dispo: Follow-up in 6 months, or sooner as needed.  I,Mathew Stumpf,acting as a Neurosurgeon for [employer], MD.,have documented all relevant documentation on the behalf of Jodelle Red, MD,as directed by  Jodelle Red, MD while in the presence of Jodelle Red, MD.  I, Jodelle Red, MD, have reviewed all documentation for this visit. The documentation on 10/14/23 for the exam, diagnosis, procedures, and orders are all accurate and complete.   Signed, Jodelle Red, MD

## 2023-09-05 ENCOUNTER — Ambulatory Visit: Payer: Medicare PPO | Attending: Family Medicine

## 2023-09-05 DIAGNOSIS — R278 Other lack of coordination: Secondary | ICD-10-CM | POA: Diagnosis not present

## 2023-09-05 DIAGNOSIS — M545 Low back pain, unspecified: Secondary | ICD-10-CM | POA: Insufficient documentation

## 2023-09-05 DIAGNOSIS — R2689 Other abnormalities of gait and mobility: Secondary | ICD-10-CM | POA: Insufficient documentation

## 2023-09-05 DIAGNOSIS — M5459 Other low back pain: Secondary | ICD-10-CM | POA: Insufficient documentation

## 2023-09-05 DIAGNOSIS — R293 Abnormal posture: Secondary | ICD-10-CM | POA: Insufficient documentation

## 2023-09-10 ENCOUNTER — Ambulatory Visit: Payer: Medicare PPO

## 2023-09-10 DIAGNOSIS — M5459 Other low back pain: Secondary | ICD-10-CM

## 2023-09-10 DIAGNOSIS — R2689 Other abnormalities of gait and mobility: Secondary | ICD-10-CM | POA: Diagnosis not present

## 2023-09-10 DIAGNOSIS — R278 Other lack of coordination: Secondary | ICD-10-CM

## 2023-09-10 DIAGNOSIS — R293 Abnormal posture: Secondary | ICD-10-CM | POA: Diagnosis not present

## 2023-09-10 DIAGNOSIS — M545 Low back pain, unspecified: Secondary | ICD-10-CM | POA: Diagnosis not present
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
## 2023-09-13 ENCOUNTER — Ambulatory Visit: Payer: Medicare PPO

## 2023-09-13 ENCOUNTER — Other Ambulatory Visit: Payer: Self-pay

## 2023-09-13 DIAGNOSIS — R2689 Other abnormalities of gait and mobility: Secondary | ICD-10-CM

## 2023-09-13 DIAGNOSIS — R278 Other lack of coordination: Secondary | ICD-10-CM | POA: Diagnosis not present

## 2023-09-13 DIAGNOSIS — M545 Low back pain, unspecified: Secondary | ICD-10-CM

## 2023-09-13 DIAGNOSIS — R293 Abnormal posture: Secondary | ICD-10-CM | POA: Diagnosis not present

## 2023-09-13 DIAGNOSIS — M5459 Other low back pain: Secondary | ICD-10-CM
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
## 2023-09-17 ENCOUNTER — Encounter: Payer: Self-pay | Admitting: Physical Therapy

## 2023-09-17 ENCOUNTER — Ambulatory Visit: Payer: Medicare PPO | Admitting: Physical Therapy

## 2023-09-17 DIAGNOSIS — R2689 Other abnormalities of gait and mobility: Secondary | ICD-10-CM | POA: Diagnosis not present

## 2023-09-17 DIAGNOSIS — M545 Low back pain, unspecified: Secondary | ICD-10-CM | POA: Diagnosis not present

## 2023-09-17 DIAGNOSIS — M5459 Other low back pain: Secondary | ICD-10-CM

## 2023-09-17 DIAGNOSIS — R278 Other lack of coordination: Secondary | ICD-10-CM | POA: Diagnosis not present

## 2023-09-17 DIAGNOSIS — R293 Abnormal posture: Secondary | ICD-10-CM | POA: Diagnosis not present
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
## 2023-09-19 ENCOUNTER — Ambulatory Visit: Payer: Medicare PPO | Admitting: Physical Therapy

## 2023-09-19 DIAGNOSIS — M5459 Other low back pain: Secondary | ICD-10-CM | POA: Diagnosis not present

## 2023-09-19 DIAGNOSIS — M545 Low back pain, unspecified: Secondary | ICD-10-CM

## 2023-09-19 DIAGNOSIS — R293 Abnormal posture: Secondary | ICD-10-CM | POA: Diagnosis not present

## 2023-09-19 DIAGNOSIS — R2689 Other abnormalities of gait and mobility: Secondary | ICD-10-CM | POA: Diagnosis not present

## 2023-09-19 DIAGNOSIS — R278 Other lack of coordination: Secondary | ICD-10-CM | POA: Diagnosis not present
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
## 2023-09-23 ENCOUNTER — Encounter: Payer: Self-pay | Admitting: Physical Therapy

## 2023-09-23 ENCOUNTER — Ambulatory Visit: Payer: Medicare PPO | Admitting: Physical Therapy

## 2023-09-23 DIAGNOSIS — R2689 Other abnormalities of gait and mobility: Secondary | ICD-10-CM | POA: Diagnosis not present

## 2023-09-23 DIAGNOSIS — R278 Other lack of coordination: Secondary | ICD-10-CM | POA: Diagnosis not present

## 2023-09-23 DIAGNOSIS — R293 Abnormal posture: Secondary | ICD-10-CM | POA: Diagnosis not present

## 2023-09-23 DIAGNOSIS — M545 Low back pain, unspecified: Secondary | ICD-10-CM | POA: Diagnosis not present

## 2023-09-23 DIAGNOSIS — M5459 Other low back pain: Secondary | ICD-10-CM
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
## 2023-09-26 ENCOUNTER — Ambulatory Visit: Payer: Medicare PPO | Admitting: Physical Therapy

## 2023-09-26 DIAGNOSIS — R2689 Other abnormalities of gait and mobility: Secondary | ICD-10-CM | POA: Diagnosis not present

## 2023-09-26 DIAGNOSIS — M5459 Other low back pain: Secondary | ICD-10-CM

## 2023-09-26 DIAGNOSIS — R278 Other lack of coordination: Secondary | ICD-10-CM | POA: Diagnosis not present

## 2023-09-26 DIAGNOSIS — M545 Low back pain, unspecified: Secondary | ICD-10-CM | POA: Diagnosis not present

## 2023-09-26 DIAGNOSIS — R293 Abnormal posture: Secondary | ICD-10-CM
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
## 2023-09-30 ENCOUNTER — Emergency Department (HOSPITAL_BASED_OUTPATIENT_CLINIC_OR_DEPARTMENT_OTHER)
Admission: EM | Admit: 2023-09-30 | Discharge: 2023-09-30 | Disposition: A | Discharge status: Home / Self Care | Payer: Medicare PPO | Source: Home / Self Care | Attending: Emergency Medicine | Admitting: Emergency Medicine

## 2023-09-30 ENCOUNTER — Other Ambulatory Visit: Payer: Self-pay

## 2023-09-30 ENCOUNTER — Encounter (HOSPITAL_BASED_OUTPATIENT_CLINIC_OR_DEPARTMENT_OTHER): Payer: Self-pay

## 2023-09-30 ENCOUNTER — Emergency Department (HOSPITAL_BASED_OUTPATIENT_CLINIC_OR_DEPARTMENT_OTHER): Payer: Medicare PPO

## 2023-09-30 DIAGNOSIS — Z66 Do not resuscitate: Secondary | ICD-10-CM | POA: Diagnosis present

## 2023-09-30 DIAGNOSIS — Z7984 Long term (current) use of oral hypoglycemic drugs: Secondary | ICD-10-CM | POA: Insufficient documentation

## 2023-09-30 DIAGNOSIS — I10 Essential (primary) hypertension: Secondary | ICD-10-CM | POA: Insufficient documentation

## 2023-09-30 DIAGNOSIS — R0603 Acute respiratory distress: Secondary | ICD-10-CM | POA: Diagnosis present

## 2023-09-30 DIAGNOSIS — K8020 Calculus of gallbladder without cholecystitis without obstruction: Secondary | ICD-10-CM | POA: Diagnosis not present

## 2023-09-30 DIAGNOSIS — I447 Left bundle-branch block, unspecified: Secondary | ICD-10-CM | POA: Diagnosis present

## 2023-09-30 DIAGNOSIS — Z95810 Presence of automatic (implantable) cardiac defibrillator: Secondary | ICD-10-CM | POA: Diagnosis not present

## 2023-09-30 DIAGNOSIS — I4819 Other persistent atrial fibrillation: Secondary | ICD-10-CM | POA: Diagnosis present

## 2023-09-30 DIAGNOSIS — J189 Pneumonia, unspecified organism: Secondary | ICD-10-CM | POA: Diagnosis present

## 2023-09-30 DIAGNOSIS — K5730 Diverticulosis of large intestine without perforation or abscess without bleeding: Secondary | ICD-10-CM | POA: Diagnosis not present

## 2023-09-30 DIAGNOSIS — Z7901 Long term (current) use of anticoagulants: Secondary | ICD-10-CM | POA: Diagnosis not present

## 2023-09-30 DIAGNOSIS — R6520 Severe sepsis without septic shock: Secondary | ICD-10-CM | POA: Diagnosis not present

## 2023-09-30 DIAGNOSIS — R509 Fever, unspecified: Secondary | ICD-10-CM | POA: Diagnosis not present

## 2023-09-30 DIAGNOSIS — Z950 Presence of cardiac pacemaker: Secondary | ICD-10-CM | POA: Diagnosis not present

## 2023-09-30 DIAGNOSIS — E119 Type 2 diabetes mellitus without complications: Secondary | ICD-10-CM | POA: Insufficient documentation

## 2023-09-30 DIAGNOSIS — E871 Hypo-osmolality and hyponatremia: Secondary | ICD-10-CM | POA: Diagnosis present

## 2023-09-30 DIAGNOSIS — Z1152 Encounter for screening for COVID-19: Secondary | ICD-10-CM | POA: Diagnosis not present

## 2023-09-30 DIAGNOSIS — A419 Sepsis, unspecified organism: Secondary | ICD-10-CM | POA: Diagnosis present

## 2023-09-30 DIAGNOSIS — E876 Hypokalemia: Secondary | ICD-10-CM | POA: Diagnosis present

## 2023-09-30 DIAGNOSIS — R630 Anorexia: Secondary | ICD-10-CM | POA: Insufficient documentation

## 2023-09-30 DIAGNOSIS — R5383 Other fatigue: Secondary | ICD-10-CM | POA: Diagnosis not present

## 2023-09-30 DIAGNOSIS — E1165 Type 2 diabetes mellitus with hyperglycemia: Secondary | ICD-10-CM | POA: Diagnosis present

## 2023-09-30 DIAGNOSIS — Z79899 Other long term (current) drug therapy: Secondary | ICD-10-CM | POA: Diagnosis not present

## 2023-09-30 DIAGNOSIS — E785 Hyperlipidemia, unspecified: Secondary | ICD-10-CM | POA: Diagnosis present

## 2023-09-30 DIAGNOSIS — Z87891 Personal history of nicotine dependence: Secondary | ICD-10-CM | POA: Diagnosis not present

## 2023-09-30 DIAGNOSIS — W19XXXA Unspecified fall, initial encounter: Secondary | ICD-10-CM | POA: Diagnosis present

## 2023-09-30 DIAGNOSIS — Z880 Allergy status to penicillin: Secondary | ICD-10-CM | POA: Diagnosis not present

## 2023-09-30 DIAGNOSIS — Z20822 Contact with and (suspected) exposure to covid-19: Secondary | ICD-10-CM | POA: Insufficient documentation

## 2023-09-30 DIAGNOSIS — Z888 Allergy status to other drugs, medicaments and biological substances status: Secondary | ICD-10-CM | POA: Diagnosis not present

## 2023-09-30 DIAGNOSIS — R0989 Other specified symptoms and signs involving the circulatory and respiratory systems: Secondary | ICD-10-CM | POA: Diagnosis not present

## 2023-09-30 DIAGNOSIS — D509 Iron deficiency anemia, unspecified: Secondary | ICD-10-CM | POA: Diagnosis present

## 2023-09-30 DIAGNOSIS — I442 Atrioventricular block, complete: Secondary | ICD-10-CM | POA: Diagnosis present

## 2023-09-30 DIAGNOSIS — K4090 Unilateral inguinal hernia, without obstruction or gangrene, not specified as recurrent: Secondary | ICD-10-CM | POA: Diagnosis not present

## 2023-09-30 DIAGNOSIS — Z89011 Acquired absence of right thumb: Secondary | ICD-10-CM | POA: Diagnosis not present

## 2023-09-30 DIAGNOSIS — I480 Paroxysmal atrial fibrillation: Secondary | ICD-10-CM | POA: Diagnosis not present

## 2023-09-30 DIAGNOSIS — R000 Tachycardia, unspecified: Secondary | ICD-10-CM | POA: Diagnosis not present

## 2023-09-30 DIAGNOSIS — I700 Atherosclerosis of aorta: Secondary | ICD-10-CM | POA: Diagnosis not present

## 2023-09-30 DIAGNOSIS — J168 Pneumonia due to other specified infectious organisms: Secondary | ICD-10-CM | POA: Diagnosis not present

## 2023-09-30 DIAGNOSIS — R590 Localized enlarged lymph nodes: Secondary | ICD-10-CM | POA: Diagnosis not present

## 2023-09-30 DIAGNOSIS — E11649 Type 2 diabetes mellitus with hypoglycemia without coma: Secondary | ICD-10-CM | POA: Diagnosis not present

## 2023-09-30 LAB — CBC WITH DIFFERENTIAL/PLATELET
Abs Immature Granulocytes: 0.1 10*3/uL — ABNORMAL HIGH (ref 0.00–0.07)
Basophils Absolute: 0 10*3/uL (ref 0.0–0.1)
Basophils Relative: 0 %
Eosinophils Absolute: 0 10*3/uL (ref 0.0–0.5)
Eosinophils Relative: 0 %
HCT: 42.7 % (ref 39.0–52.0)
Hemoglobin: 14.2 g/dL (ref 13.0–17.0)
Immature Granulocytes: 1 %
Lymphocytes Relative: 5 %
Lymphs Abs: 0.8 10*3/uL (ref 0.7–4.0)
MCH: 30.8 pg (ref 26.0–34.0)
MCHC: 33.3 g/dL (ref 30.0–36.0)
MCV: 92.6 fL (ref 80.0–100.0)
Monocytes Absolute: 1 10*3/uL (ref 0.1–1.0)
Monocytes Relative: 6 %
Neutro Abs: 13.3 10*3/uL — ABNORMAL HIGH (ref 1.7–7.7)
Neutrophils Relative %: 88 %
Platelets: 144 10*3/uL — ABNORMAL LOW (ref 150–400)
RBC: 4.61 MIL/uL (ref 4.22–5.81)
RDW: 13.3 % (ref 11.5–15.5)
WBC: 15.2 10*3/uL — ABNORMAL HIGH (ref 4.0–10.5)
nRBC: 0 % (ref 0.0–0.2)

## 2023-09-30 LAB — HEPATIC FUNCTION PANEL
ALT: 16 U/L (ref 0–44)
AST: 15 U/L (ref 15–41)
Albumin: 3.7 g/dL (ref 3.5–5.0)
Alkaline Phosphatase: 44 U/L (ref 38–126)
Bilirubin, Direct: 0.7 mg/dL — ABNORMAL HIGH (ref 0.0–0.2)
Indirect Bilirubin: 1.4 mg/dL — ABNORMAL HIGH (ref 0.3–0.9)
Total Bilirubin: 2.1 mg/dL — ABNORMAL HIGH (ref 0.3–1.2)
Total Protein: 6.5 g/dL (ref 6.5–8.1)

## 2023-09-30 LAB — BASIC METABOLIC PANEL
Anion gap: 13 (ref 5–15)
BUN: 22 mg/dL (ref 8–23)
CO2: 23 mmol/L (ref 22–32)
Calcium: 9.4 mg/dL (ref 8.9–10.3)
Chloride: 98 mmol/L (ref 98–111)
Creatinine, Ser: 0.88 mg/dL (ref 0.61–1.24)
GFR, Estimated: >60 mL/min (ref >60)
Glucose, Bld: 136 mg/dL — ABNORMAL HIGH (ref 70–99)
Potassium: 4.4 mmol/L (ref 3.5–5.1)
Sodium: 134 mmol/L — ABNORMAL LOW (ref 135–145)

## 2023-09-30 LAB — URINALYSIS, ROUTINE W REFLEX MICROSCOPIC
Bacteria, UA: NONE SEEN
Bilirubin Urine: NEGATIVE
Glucose, UA: >1000 mg/dL — AB
Ketones, ur: 40 mg/dL — AB
Leukocytes,Ua: NEGATIVE
Nitrite: NEGATIVE
Protein, ur: 30 mg/dL — AB
Specific Gravity, Urine: 1.038 — ABNORMAL HIGH (ref 1.005–1.030)
pH: 5.5 (ref 5.0–8.0)

## 2023-09-30 LAB — LACTIC ACID, PLASMA: Lactic Acid, Venous: 1.8 mmol/L (ref 0.5–1.9)

## 2023-09-30 LAB — CBG MONITORING, ED: Glucose-Capillary: 166 mg/dL — ABNORMAL HIGH (ref 70–99)

## 2023-09-30 LAB — SARS CORONAVIRUS 2 BY RT PCR: SARS Coronavirus 2 by RT PCR: NEGATIVE

## 2023-09-30 LAB — LIPASE, BLOOD: Lipase: <10 U/L — ABNORMAL LOW (ref 11–51)

## 2023-09-30 MED ORDER — ACETAMINOPHEN 325 MG PO TABS
650.0000 mg | ORAL_TABLET | Freq: Once | ORAL | Status: AC
  Administered 2023-09-30: 650 mg via ORAL
  Filled 2023-09-30: qty 2

## 2023-09-30 MED ORDER — ONDANSETRON 4 MG PO TBDP: 4.0000 mg | ORAL_TABLET | Freq: Three times a day (TID) | ORAL | 0 refills | Status: DC | PRN

## 2023-09-30 MED ORDER — LACTATED RINGERS IV BOLUS
1000.0000 mL | Freq: Once | INTRAVENOUS | Status: AC
  Administered 2023-09-30: 1000 mL via INTRAVENOUS

## 2023-09-30 NOTE — Discharge Instructions (Signed)
You have been seen today for your complaint of fatigue. Your lab work showed a high white blood cell count but was otherwise reassuring. Your imaging was reassuring and showed no abnormalities. Your discharge medications include Zofran.  This is a nausea medicine.  Take it as needed for nausea. Home care instructions are as follows:  Eat and drink normal diet Follow up with: Your PCP within 2 days Please seek immediate medical care if you develop any of the following symptoms: You feel confused, feel like you might faint, or faint. Your vision is blurry or you have a severe headache. You have severe pain in your abdomen, your back, or the area between your waist and hips (pelvis). You have chest pain, shortness of breath, or an irregular or fast heartbeat. You are unable to urinate, or you urinate less than normal. You have abnormal bleeding from the rectum, nose, lungs, nipples, or, if you are male, the vagina. You vomit blood. You have thoughts about hurting yourself or others. At this time there does not appear to be the presence of an emergent medical condition, however there is always the potential for conditions to change. Please read and follow the below instructions.  Do not take your medicine if  develop an itchy rash, swelling in your mouth or lips, or difficulty breathing; call 911 and seek immediate emergency medical attention if this occurs.  You may review your lab tests and imaging results in their entirety on your MyChart account.  Please discuss all results of fully with your primary care provider and other specialist at your follow-up visit.  Note: Portions of this text may have been transcribed using voice recognition software. Every effort was made to ensure accuracy; however, inadvertent computerized transcription errors may still be present.

## 2023-09-30 NOTE — ED Triage Notes (Signed)
Pt c/o "feeling bad," fatigue, congestion, 102 fever Saturday night, nausea, onset Friday. Took home covid test, "but I'm afraid it was expired."   Tylenol PTA

## 2023-09-30 NOTE — ED Provider Notes (Signed)
[facility] EMERGENCY DEPARTMENT AT [facility] Provider Note   CSN: [id number] Arrival date & time: 09/30/23  1228     History  Chief Complaint  Patient presents with   URI    Benjamin Fuller is a 82 y.o. male.  With history of A-fib on Eliquis, diabetes, hyperlipidemia, anemia, hypertension presenting to the ED for evaluation of fatigue, congestion, intermittent fevers, chills.  Symptoms began approximately 4 days ago.  3 days ago he had a fever with a Tmax of 102 Fahrenheit.  He has been taking Tylenol for these fevers.  Last Tylenol was approximately 7 hours prior to arrival.  He reports significant fatigue and near inability to perform activities of daily living.  He also reports some anorexia and has not been able to eat or drink much over the past 2 days.  He reports 6 congestion has transitioned mostly to rhinorrhea at this point.  He denies any chest pain, shortness of breath or cough.  No abdominal pain.  No known sick contacts.  Reports compliance with his Eliquis.   URI Presenting symptoms: fatigue and fever        Home Medications Prior to Admission medications   Medication Sig Start Date End Date Taking? Authorizing Provider  ondansetron (ZOFRAN-ODT) 4 MG disintegrating tablet Take 1 tablet (4 mg total) by mouth every 8 (eight) hours as needed for nausea or vomiting. 09/30/23  Yes Cejay Cambre, Edsel Petrin, PA-C  apixaban (ELIQUIS) 5 MG TABS tablet TAKE 1 TABLET TWICE A DAY 05/15/23   Jodelle Red, MD  diphenhydramine-acetaminophen (TYLENOL PM) 25-500 MG TABS tablet Take 1 tablet by mouth at bedtime.    [provider]  Emollient (EUCERIN) lotion Apply 1 Application topically as needed for dry skin.    [provider]  ferrous gluconate (FERGON) 324 MG tablet Take 324 mg by mouth daily with breakfast.    [provider]  furosemide (LASIX) 40 MG tablet Take 1 tablet (40 mg total) by mouth daily as needed. For weight gain >5 lbs,  swelling, shortness of breath 01/09/21 07/23/23  Jodelle Red, MD  glimepiride (AMARYL) 4 MG tablet Take 4 mg by mouth 2 (two) times daily.     [provider]  JARDIANCE 25 MG TABS tablet Take 25 mg by mouth daily.    [provider]  metFORMIN (GLUCOPHAGE-XR) 500 MG 24 hr tablet Take 500 mg by mouth daily.    [provider]  Multiple Vitamin (MULTIVITAMIN WITH MINERALS) TABS tablet Take 1 tablet by mouth daily.    [provider]  pioglitazone (ACTOS) 15 MG tablet Take 15 mg by mouth daily. 11/04/20   [provider]  ramipril (ALTACE) 10 MG capsule Take 10 mg by mouth daily.    [provider]  simvastatin (ZOCOR) 40 MG tablet Take 40 mg by mouth at bedtime.    [provider]  sodium chloride (OCEAN) 0.65 % SOLN nasal spray Place 1 spray into both nostrils at bedtime.    [provider]  tamsulosin (FLOMAX) 0.4 MG CAPS capsule Take 0.4 mg by mouth daily.    [provider]  vitamin B-12 (CYANOCOBALAMIN) 1000 MCG tablet Take 1,000 mcg by mouth daily.    [provider]      Allergies    Metformin hcl and Penicillins    Review of Systems   Review of Systems  Constitutional:  Positive for chills, fatigue and fever.  All other systems reviewed and are negative.   Physical  Exam Updated Vital Signs BP 131/63   Pulse 88   Temp 98.5 F (36.9 C) (Oral)   Resp 20   SpO2 98%  Physical Exam Vitals and nursing note reviewed.  Constitutional:      General: He is not in acute distress.    Appearance: He is well-developed.     Comments: Resting comfortably in bed  HENT:     Head: Normocephalic and atraumatic.  Eyes:     Conjunctiva/sclera: Conjunctivae normal.  Cardiovascular:     Rate and Rhythm: Normal rate. Rhythm irregular.     Heart sounds: No murmur heard. Pulmonary:     Effort: Pulmonary effort is normal. No respiratory distress.  Abdominal:     Palpations: Abdomen is soft.      Tenderness: There is no abdominal tenderness.  Musculoskeletal:        General: No swelling.     Cervical back: Neck supple.  Skin:    General: Skin is warm and dry.     Capillary Refill: Capillary refill takes less than 2 seconds.  Neurological:     General: No focal deficit present.     Mental Status: He is alert and oriented to person, place, and time.  Psychiatric:        Mood and Affect: Mood normal.     ED Results / Procedures / Treatments   Labs (all labs ordered are listed, but only abnormal results are displayed) Labs Reviewed  BASIC METABOLIC PANEL - Abnormal; Notable for the following components:      Result Value   Sodium 134 (*)    Glucose, Bld 136 (*)    All other components within normal limits  CBC WITH DIFFERENTIAL/PLATELET - Abnormal; Notable for the following components:   WBC 15.2 (*)    Platelets 144 (*)    Neutro Abs 13.3 (*)    Abs Immature Granulocytes 0.10 (*)    All other components within normal limits  URINALYSIS, ROUTINE W REFLEX MICROSCOPIC - Abnormal; Notable for the following components:   Specific Gravity, Urine 1.038 (*)    Glucose, UA >1,000 (*)    Hgb urine dipstick SMALL (*)    Ketones, ur 40 (*)    Protein, ur 30 (*)    All other components within normal limits  HEPATIC FUNCTION PANEL - Abnormal; Notable for the following components:   Total Bilirubin 2.1 (*)    Bilirubin, Direct 0.7 (*)    Indirect Bilirubin 1.4 (*)    All other components within normal limits  LIPASE, BLOOD - Abnormal; Notable for the following components:   Lipase <10 (*)    All other components within normal limits  CBG MONITORING, ED - Abnormal; Notable for the following components:   Glucose-Capillary 166 (*)    All other components within normal limits  SARS CORONAVIRUS 2 BY RT PCR  URINE CULTURE  CULTURE, BLOOD (ROUTINE X 2)  CULTURE, BLOOD (ROUTINE X 2)  LACTIC ACID, PLASMA  LACTIC ACID, PLASMA    EKG EKG Interpretation Date/Time:  Monday  September 30 2023 19:18:11 EDT Ventricular Rate:  82 PR Interval:  156 QRS Duration:  116 QT Interval:  417 QTC Calculation: 417 R Axis:   -7  Text Interpretation: A-V dual-paced rhythm with some inhibition No further analysis attempted due to paced rhythm No significant change since last tracing Confirmed by Alvira Monday ([id number]) on 09/30/2023 8:26:40 PM  Radiology DG Chest Port 1 View  Result Date: 09/30/2023 CLINICAL DATA:  Fatigue with weakness,  runny nose and fevers for 4 days. EXAM: PORTABLE CHEST 1 VIEW COMPARISON:  Radiographs 07/09/2022 and 12/21/2021. Cardiac CT 03/24/2021. FINDINGS: 1523 hours. Left subclavian pacemaker leads appear unchanged, projecting over the right atrium and right ventricle. The heart size and mediastinal contours are stable. There is mild aortic atherosclerosis. The lungs are clear. There is no pleural effusion or pneumothorax. No acute osseous findings are seen status post lower cervical fusion. Mild thoracic spondylosis. IMPRESSION: No evidence of acute cardiopulmonary process. Pacemaker leads appear unchanged in position. Electronically Signed   By: Carey Bullocks M.D.   On: 09/30/2023 18:16    Procedures Procedures    Medications Ordered in ED Medications  acetaminophen (TYLENOL) tablet 650 mg (650 mg Oral Given 09/30/23 1743)  lactated ringers bolus 1,000 mL (0 mLs Intravenous Stopped 09/30/23 2054)    ED Course/ Medical Decision Making/ A&P                                 Medical Decision Making Amount and/or Complexity of Data Reviewed Labs: ordered. Radiology: ordered.  Risk OTC drugs.   This patient presents to the ED for concern of fatigue, chills, fevers, this involves an extensive number of treatment options, and is a complaint that carries with it a high risk of complications and morbidity.  The differential diagnosis includes flu, COVID, RSV, other viral URI, pneumonia  My initial workup includes basic labs, respiratory panel,  imaging  Additional history obtained from: Nursing notes from this visit. Family wife at bedside provides a portion of the history  I ordered, reviewed and interpreted labs which include: COVID, CBC, BMP.  COVID-negative.  I ordered imaging studies including chest x-ray I independently visualized and interpreted imaging which showed negative I agree with the radiologist interpretation  Cardiac Monitoring:  The patient was maintained on a cardiac monitor.  I personally viewed and interpreted the cardiac monitored which showed an underlying rhythm of: Paced rhythm  Hemodynamically stable, 82 year old male presenting for evaluation of intermittent fevers, anorexia, fatigue.  Symptoms have been present for the past 4 days.  He reports feeling better just prior to arrival.  On exam, he did have some slightly increased work of breathing.  He is nontachypneic and nonhypoxic.  Chest x-ray negative.  Lab workup was reassuring, but did have a leukocytosis of 15.2.  He then had a fever in the emergency department.  Blood cultures were then obtained.  No obvious source of infection.  Overall do suspect this is a viral illness.  I had a shared decision-making conversation with the patient regarding admission to the hospital versus discharge home.  Patient was strongly prefer to be discharged home.  Believe this is reasonable.  He was given strict return precautions.  He was encouraged to follow-up with his PCP as soon as possible for reevaluation.  Stable at discharge.  At this time there does not appear to be any evidence of an acute emergency medical condition and the patient appears stable for discharge with appropriate outpatient follow up. Diagnosis was discussed with patient who verbalizes understanding of care plan and is agreeable to discharge. I have discussed return precautions with patient who verbalizes understanding. Patient encouraged to follow-up with their PCP within 2 days. All questions  answered.  Patient's case discussed with Dr. Dalene Seltzer who agrees with plan to discharge with follow-up.   Note: Portions of this report may have been transcribed using voice recognition software. Every  effort was made to ensure accuracy; however, inadvertent computerized transcription errors may still be present.        Final Clinical Impression(s) / ED Diagnoses Final diagnoses:  Other fatigue  Anorexia    Rx / DC Orders ED Discharge Orders          Ordered    ondansetron (ZOFRAN-ODT) 4 MG disintegrating tablet  Every 8 hours PRN        09/30/23 2044              Mora Bellman 09/30/23 2144    Alvira Monday, MD 10/02/23 1329

## 2023-10-01 ENCOUNTER — Telehealth: Payer: Self-pay | Admitting: Cardiology

## 2023-10-01 NOTE — Telephone Encounter (Signed)
Pt wife is requesting a callback regarding pt not feeling well. She stated he was seen in ED yesterday but he's still feeling the same way. Pt is having some chills and a fever off and on, no appetite and they ran multiple test on him while in the ED but they were discharged with no medication given to him. She'd very concerned and would like to speak with someone as soon as possible. Please advise

## 2023-10-01 NOTE — Telephone Encounter (Signed)
Returned call to patient and wife,   Wife states that he was seen in the ED yesterday from 12-1030pm. She says they sent him home with no medication. She describes him as limp dish rag, chills, fever, no appetite. She states this started around Friday until Sunday then the symptoms really got kicked in. She states they ruled out COVID and other things.   Advised them to call PCP due to non cardiac nature of sickness.

## 2023-10-02 ENCOUNTER — Other Ambulatory Visit: Payer: Self-pay

## 2023-10-02 ENCOUNTER — Emergency Department (HOSPITAL_COMMUNITY): Payer: Medicare PPO

## 2023-10-02 ENCOUNTER — Ambulatory Visit: Payer: Medicare PPO

## 2023-10-02 ENCOUNTER — Inpatient Hospital Stay (HOSPITAL_COMMUNITY)
Admission: EM | Admit: 2023-10-02 | Discharge: 2023-10-07 | DRG: 871 | Disposition: A | Discharge status: Home / Self Care | Payer: Medicare PPO | Attending: Internal Medicine | Admitting: Internal Medicine

## 2023-10-02 ENCOUNTER — Ambulatory Visit (INDEPENDENT_AMBULATORY_CARE_PROVIDER_SITE_OTHER): Payer: Medicare Other

## 2023-10-02 ENCOUNTER — Encounter (HOSPITAL_COMMUNITY): Payer: Self-pay

## 2023-10-02 DIAGNOSIS — E876 Hypokalemia: Secondary | ICD-10-CM | POA: Diagnosis present

## 2023-10-02 DIAGNOSIS — J168 Pneumonia due to other specified infectious organisms: Secondary | ICD-10-CM | POA: Diagnosis not present

## 2023-10-02 DIAGNOSIS — E871 Hypo-osmolality and hyponatremia: Secondary | ICD-10-CM | POA: Diagnosis present

## 2023-10-02 DIAGNOSIS — Z7984 Long term (current) use of oral hypoglycemic drugs: Secondary | ICD-10-CM

## 2023-10-02 DIAGNOSIS — Z95810 Presence of automatic (implantable) cardiac defibrillator: Secondary | ICD-10-CM

## 2023-10-02 DIAGNOSIS — I442 Atrioventricular block, complete: Secondary | ICD-10-CM

## 2023-10-02 DIAGNOSIS — J189 Pneumonia, unspecified organism: Secondary | ICD-10-CM

## 2023-10-02 DIAGNOSIS — E11649 Type 2 diabetes mellitus with hypoglycemia without coma: Secondary | ICD-10-CM | POA: Diagnosis not present

## 2023-10-02 DIAGNOSIS — Z888 Allergy status to other drugs, medicaments and biological substances status: Secondary | ICD-10-CM | POA: Diagnosis not present

## 2023-10-02 DIAGNOSIS — K8020 Calculus of gallbladder without cholecystitis without obstruction: Secondary | ICD-10-CM | POA: Diagnosis not present

## 2023-10-02 DIAGNOSIS — Z79899 Other long term (current) drug therapy: Secondary | ICD-10-CM

## 2023-10-02 DIAGNOSIS — Z880 Allergy status to penicillin: Secondary | ICD-10-CM | POA: Diagnosis not present

## 2023-10-02 DIAGNOSIS — I10 Essential (primary) hypertension: Secondary | ICD-10-CM | POA: Diagnosis present

## 2023-10-02 DIAGNOSIS — W19XXXA Unspecified fall, initial encounter: Secondary | ICD-10-CM | POA: Diagnosis present

## 2023-10-02 DIAGNOSIS — E1165 Type 2 diabetes mellitus with hyperglycemia: Secondary | ICD-10-CM | POA: Diagnosis present

## 2023-10-02 DIAGNOSIS — I480 Paroxysmal atrial fibrillation: Secondary | ICD-10-CM | POA: Diagnosis not present

## 2023-10-02 DIAGNOSIS — Z950 Presence of cardiac pacemaker: Secondary | ICD-10-CM | POA: Diagnosis not present

## 2023-10-02 DIAGNOSIS — I4819 Other persistent atrial fibrillation: Secondary | ICD-10-CM | POA: Diagnosis present

## 2023-10-02 DIAGNOSIS — Z66 Do not resuscitate: Secondary | ICD-10-CM | POA: Diagnosis present

## 2023-10-02 DIAGNOSIS — Z89011 Acquired absence of right thumb: Secondary | ICD-10-CM | POA: Diagnosis not present

## 2023-10-02 DIAGNOSIS — Z7901 Long term (current) use of anticoagulants: Secondary | ICD-10-CM | POA: Diagnosis not present

## 2023-10-02 DIAGNOSIS — Z1152 Encounter for screening for COVID-19: Secondary | ICD-10-CM | POA: Diagnosis not present

## 2023-10-02 DIAGNOSIS — A419 Sepsis, unspecified organism: Secondary | ICD-10-CM | POA: Diagnosis present

## 2023-10-02 DIAGNOSIS — E785 Hyperlipidemia, unspecified: Secondary | ICD-10-CM | POA: Diagnosis present

## 2023-10-02 DIAGNOSIS — D509 Iron deficiency anemia, unspecified: Secondary | ICD-10-CM | POA: Diagnosis present

## 2023-10-02 DIAGNOSIS — R0603 Acute respiratory distress: Secondary | ICD-10-CM | POA: Diagnosis present

## 2023-10-02 DIAGNOSIS — R6520 Severe sepsis without septic shock: Secondary | ICD-10-CM

## 2023-10-02 DIAGNOSIS — K5730 Diverticulosis of large intestine without perforation or abscess without bleeding: Secondary | ICD-10-CM | POA: Diagnosis not present

## 2023-10-02 DIAGNOSIS — Z87891 Personal history of nicotine dependence: Secondary | ICD-10-CM

## 2023-10-02 DIAGNOSIS — R590 Localized enlarged lymph nodes: Secondary | ICD-10-CM | POA: Diagnosis not present

## 2023-10-02 DIAGNOSIS — I447 Left bundle-branch block, unspecified: Secondary | ICD-10-CM | POA: Diagnosis present

## 2023-10-02 DIAGNOSIS — K4090 Unilateral inguinal hernia, without obstruction or gangrene, not specified as recurrent: Secondary | ICD-10-CM | POA: Diagnosis not present

## 2023-10-02 DIAGNOSIS — R000 Tachycardia, unspecified: Secondary | ICD-10-CM | POA: Diagnosis not present

## 2023-10-02 LAB — CUP PACEART REMOTE DEVICE CHECK
Battery Remaining Longevity: 138 mo
Battery Remaining Percentage: 100 %
Brady Statistic RA Percent Paced: 7 %
Brady Statistic RV Percent Paced: 100 %
Date Time Interrogation Session: 20241002024100
Implantable Lead Connection Status: 753985
Implantable Lead Connection Status: 753985
Implantable Lead Implant Date: 20221222
Implantable Lead Implant Date: 20221222
Implantable Lead Location: 753859
Implantable Lead Location: 753860
Implantable Lead Model: 7841
Implantable Lead Model: 7842
Implantable Lead Serial Number: 1102393
Implantable Lead Serial Number: 1161013
Implantable Pulse Generator Implant Date: 20221222
Lead Channel Impedance Value: 600 Ohm
Lead Channel Impedance Value: 690 Ohm
Lead Channel Pacing Threshold Amplitude: 0.5 V
Lead Channel Pacing Threshold Pulse Width: 0.4 ms
Lead Channel Setting Pacing Amplitude: 2.5 V
Lead Channel Setting Pacing Amplitude: 2.5 V
Lead Channel Setting Pacing Pulse Width: 0.4 ms
Lead Channel Setting Sensing Sensitivity: 2.5 mV
Pulse Gen Serial Number: 562911
Zone Setting Status: 755011

## 2023-10-02 LAB — BASIC METABOLIC PANEL
Anion gap: 11 (ref 5–15)
BUN: 23 mg/dL (ref 8–23)
CO2: 23 mmol/L (ref 22–32)
Calcium: 8.6 mg/dL — ABNORMAL LOW (ref 8.9–10.3)
Chloride: 96 mmol/L — ABNORMAL LOW (ref 98–111)
Creatinine, Ser: 0.79 mg/dL (ref 0.61–1.24)
GFR, Estimated: >60 mL/min (ref >60)
Glucose, Bld: 67 mg/dL — ABNORMAL LOW (ref 70–99)
Potassium: 3 mmol/L — ABNORMAL LOW (ref 3.5–5.1)
Sodium: 130 mmol/L — ABNORMAL LOW (ref 135–145)

## 2023-10-02 LAB — I-STAT CG4 LACTIC ACID, ED
Lactic Acid, Venous: 3.3 mmol/L (ref 0.5–1.9)
Lactic Acid, Venous: 2.6 mmol/L (ref 0.5–1.9)

## 2023-10-02 LAB — CBC
HCT: 40.1 % (ref 39.0–52.0)
Hemoglobin: 13.5 g/dL (ref 13.0–17.0)
MCH: 30.5 pg (ref 26.0–34.0)
MCHC: 33.7 g/dL (ref 30.0–36.0)
MCV: 90.7 fL (ref 80.0–100.0)
Platelets: 169 10*3/uL (ref 150–400)
RBC: 4.42 MIL/uL (ref 4.22–5.81)
RDW: 13.2 % (ref 11.5–15.5)
WBC: 14.1 10*3/uL — ABNORMAL HIGH (ref 4.0–10.5)
nRBC: 0 % (ref 0.0–0.2)

## 2023-10-02 LAB — CBG MONITORING, ED
Glucose-Capillary: 102 mg/dL — ABNORMAL HIGH (ref 70–99)
Glucose-Capillary: 142 mg/dL — ABNORMAL HIGH (ref 70–99)
Glucose-Capillary: 197 mg/dL — ABNORMAL HIGH (ref 70–99)
Glucose-Capillary: 55 mg/dL — ABNORMAL LOW (ref 70–99)
Glucose-Capillary: 63 mg/dL — ABNORMAL LOW (ref 70–99)
Glucose-Capillary: 74 mg/dL (ref 70–99)
Glucose-Capillary: 88 mg/dL (ref 70–99)

## 2023-10-02 LAB — URINALYSIS, ROUTINE W REFLEX MICROSCOPIC
Bilirubin Urine: NEGATIVE
Glucose, UA: >=500 mg/dL — AB
Ketones, ur: 20 mg/dL — AB
Leukocytes,Ua: NEGATIVE
Nitrite: NEGATIVE
Protein, ur: 100 mg/dL — AB
Specific Gravity, Urine: 1.03 (ref 1.005–1.030)
pH: 5 (ref 5.0–8.0)

## 2023-10-02 LAB — RESP PANEL BY RT-PCR (RSV, FLU A&B, COVID)  RVPGX2
Influenza A by PCR: NEGATIVE
Influenza B by PCR: NEGATIVE
Resp Syncytial Virus by PCR: NEGATIVE
SARS Coronavirus 2 by RT PCR: NEGATIVE

## 2023-10-02 LAB — URINE CULTURE

## 2023-10-02 LAB — PHOSPHORUS: Phosphorus: 1.3 mg/dL — ABNORMAL LOW (ref 2.5–4.6)

## 2023-10-02 MED ORDER — ENOXAPARIN SODIUM 40 MG/0.4ML IJ SOSY: 40.0000 mg | PREFILLED_SYRINGE | INTRAMUSCULAR | Status: DC

## 2023-10-02 MED ORDER — TAMSULOSIN HCL 0.4 MG PO CAPS
0.4000 mg | ORAL_CAPSULE | Freq: Every day | ORAL | Status: DC
  Administered 2023-10-03 – 2023-10-07 (×5): 0.4 mg via ORAL
  Filled 2023-10-02 (×5): qty 1

## 2023-10-02 MED ORDER — DEXTROSE 50 % IV SOLN
1.0000 | Freq: Once | INTRAVENOUS | Status: AC
  Administered 2023-10-02: 50 mL via INTRAVENOUS
  Filled 2023-10-02: qty 50

## 2023-10-02 MED ORDER — POTASSIUM CHLORIDE CRYS ER 20 MEQ PO TBCR
40.0000 meq | EXTENDED_RELEASE_TABLET | Freq: Once | ORAL | Status: AC
  Administered 2023-10-02: 40 meq via ORAL
  Filled 2023-10-02: qty 2

## 2023-10-02 MED ORDER — SODIUM CHLORIDE 0.9 % IV SOLN
1.0000 g | INTRAVENOUS | Status: DC
  Administered 2023-10-02: 1 g via INTRAVENOUS
  Filled 2023-10-02: qty 10

## 2023-10-02 MED ORDER — SODIUM CHLORIDE 0.9 % IV SOLN: INTRAVENOUS | Status: DC

## 2023-10-02 MED ORDER — SODIUM CHLORIDE 0.9 % IV SOLN
2.0000 g | Freq: Once | INTRAVENOUS | Status: AC
  Administered 2023-10-02: 2 g via INTRAVENOUS
  Filled 2023-10-02: qty 12.5

## 2023-10-02 MED ORDER — VANCOMYCIN HCL 1750 MG/350ML IV SOLN
1750.0000 mg | Freq: Once | INTRAVENOUS | Status: AC
  Administered 2023-10-02: 1750 mg via INTRAVENOUS
  Filled 2023-10-02: qty 350

## 2023-10-02 MED ORDER — ACETAMINOPHEN 500 MG PO TABS
1000.0000 mg | ORAL_TABLET | Freq: Once | ORAL | Status: AC
  Administered 2023-10-02: 1000 mg via ORAL
  Filled 2023-10-02: qty 2

## 2023-10-02 MED ORDER — ONDANSETRON HCL 4 MG/2ML IJ SOLN: 4.0000 mg | Freq: Four times a day (QID) | INTRAMUSCULAR | Status: DC | PRN

## 2023-10-02 MED ORDER — APIXABAN 5 MG PO TABS
5.0000 mg | ORAL_TABLET | Freq: Two times a day (BID) | ORAL | Status: DC
  Administered 2023-10-02 – 2023-10-07 (×10): 5 mg via ORAL
  Filled 2023-10-02 (×10): qty 1

## 2023-10-02 MED ORDER — SODIUM CHLORIDE 0.9 % IV BOLUS
1000.0000 mL | Freq: Once | INTRAVENOUS | Status: AC
  Administered 2023-10-02: 1000 mL via INTRAVENOUS

## 2023-10-02 MED ORDER — METRONIDAZOLE 500 MG/100ML IV SOLN
500.0000 mg | Freq: Once | INTRAVENOUS | Status: AC
  Administered 2023-10-02: 500 mg via INTRAVENOUS
  Filled 2023-10-02: qty 100

## 2023-10-02 MED ORDER — IOHEXOL 300 MG/ML  SOLN
100.0000 mL | Freq: Once | INTRAMUSCULAR | Status: AC | PRN
  Administered 2023-10-02: 100 mL via INTRAVENOUS

## 2023-10-02 MED ORDER — ACETAMINOPHEN 325 MG PO TABS: 650.0000 mg | ORAL_TABLET | Freq: Four times a day (QID) | ORAL | Status: DC | PRN

## 2023-10-02 MED ORDER — RAMIPRIL 5 MG PO CAPS
10.0000 mg | ORAL_CAPSULE | Freq: Every day | ORAL | Status: DC
  Administered 2023-10-03 – 2023-10-07 (×5): 10 mg via ORAL
  Filled 2023-10-02 (×6): qty 2

## 2023-10-02 MED ORDER — FERROUS GLUCONATE 324 (38 FE) MG PO TABS
324.0000 mg | ORAL_TABLET | Freq: Every day | ORAL | Status: DC
  Administered 2023-10-03 – 2023-10-07 (×5): 324 mg via ORAL
  Filled 2023-10-02 (×5): qty 1

## 2023-10-02 MED ORDER — LACTATED RINGERS IV SOLN: INTRAVENOUS | Status: DC

## 2023-10-02 MED ORDER — VANCOMYCIN HCL IN DEXTROSE 1-5 GM/200ML-% IV SOLN: 1000.0000 mg | Freq: Once | INTRAVENOUS | Status: DC

## 2023-10-02 MED ORDER — IBUPROFEN 200 MG PO TABS: 600.0000 mg | ORAL_TABLET | Freq: Four times a day (QID) | ORAL | Status: DC | PRN

## 2023-10-02 MED ORDER — SODIUM CHLORIDE 0.9 % IV SOLN
500.0000 mg | INTRAVENOUS | Status: DC
  Administered 2023-10-02 – 2023-10-03 (×2): 500 mg via INTRAVENOUS
  Filled 2023-10-02 (×2): qty 5

## 2023-10-02 MED ORDER — LACTATED RINGERS IV BOLUS
1000.0000 mL | Freq: Once | INTRAVENOUS | Status: AC
  Administered 2023-10-02: 1000 mL via INTRAVENOUS

## 2023-10-02 MED ORDER — VITAMIN B-12 1000 MCG PO TABS
1000.0000 ug | ORAL_TABLET | Freq: Every day | ORAL | Status: DC
  Administered 2023-10-03 – 2023-10-07 (×5): 1000 ug via ORAL
  Filled 2023-10-02 (×5): qty 1

## 2023-10-02 MED ORDER — SALINE SPRAY 0.65 % NA SOLN
1.0000 | Freq: Every day | NASAL | Status: DC
  Administered 2023-10-03 – 2023-10-05 (×3): 1 via NASAL
  Filled 2023-10-02: qty 44

## 2023-10-02 MED ORDER — SIMVASTATIN 40 MG PO TABS
40.0000 mg | ORAL_TABLET | Freq: Every day | ORAL | Status: DC
  Administered 2023-10-02 – 2023-10-06 (×5): 40 mg via ORAL
  Filled 2023-10-02: qty 2
  Filled 2023-10-02 (×4): qty 1

## 2023-10-02 NOTE — Assessment & Plan Note (Signed)
-  CBG of 55 on presentation due to decrease oral intake. Improved with oral intake in ED but later trended down to 60s again -give amp D50 and recheck in an hour -continue q4hr CBG checks  -Encouraged oral intake

## 2023-10-02 NOTE — Sepsis Progress Note (Signed)
Sepsis protocol monitored by eLink ?

## 2023-10-02 NOTE — ED Notes (Signed)
Patient given orange juice for bgl of 74.
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
## 2023-10-02 NOTE — Assessment & Plan Note (Signed)
-  keep on continuous IV fluid

## 2023-10-02 NOTE — Assessment & Plan Note (Signed)
continue statin

## 2023-10-02 NOTE — ED Triage Notes (Signed)
Patient reports fatigue, congestion, fever, nausea, and general weakness x 5 day. Patient seen 2 days ago for same, told to come back if it didn't get better.

## 2023-10-02 NOTE — ED Provider Notes (Signed)
[place] AT [facility] Provider Note   CSN: [id number] Arrival date & time: 10/02/23  1021     History  Chief Complaint  Patient presents with   Fever   Weakness    Benjamin Fuller is a 82 y.o. male with PMHx anemia, arthritis, DM, HLD, HTN, afib who presents to ED concerned for fatigue, congestion, fever, and generalized weakness x5 days. Endorses fever of 101F this morning. Last dose of Tylenol around 9AM this morning. Patient stating that their fatigue has gotten so severe that his legs gave out yesterday and he had a fall. Denies LOC, head trauma, or seizures.  Denies chest pain, dyspnea, cough, nausea, vomiting, diarrhea, dysuria, hematuria.   Fever Weakness Associated symptoms: fever        Home Medications Prior to Admission medications   Medication Sig Start Date End Date Taking? Authorizing Provider  apixaban (ELIQUIS) 5 MG TABS tablet TAKE 1 TABLET TWICE A DAY 05/15/23   Jodelle Red, MD  diphenhydramine-acetaminophen (TYLENOL PM) 25-500 MG TABS tablet Take 1 tablet by mouth at bedtime.    [provider]  Emollient (EUCERIN) lotion Apply 1 Application topically as needed for dry skin.    [provider]  ferrous gluconate (FERGON) 324 MG tablet Take 324 mg by mouth daily with breakfast.    [provider]  furosemide (LASIX) 40 MG tablet Take 1 tablet (40 mg total) by mouth daily as needed. For weight gain >5 lbs, swelling, shortness of breath 01/09/21 07/23/23  Jodelle Red, MD  glimepiride (AMARYL) 4 MG tablet Take 4 mg by mouth 2 (two) times daily.     [provider]  JARDIANCE 25 MG TABS tablet Take 25 mg by mouth daily.    [provider]  metFORMIN (GLUCOPHAGE-XR) 500 MG 24 hr tablet Take 500 mg by mouth daily.    [provider]  Multiple Vitamin (MULTIVITAMIN WITH MINERALS) TABS tablet Take 1 tablet by mouth daily.    [provider]   ondansetron (ZOFRAN-ODT) 4 MG disintegrating tablet Take 1 tablet (4 mg total) by mouth every 8 (eight) hours as needed for nausea or vomiting. 09/30/23   Schutt, Edsel Petrin, PA-C  pioglitazone (ACTOS) 15 MG tablet Take 15 mg by mouth daily. 11/04/20   [provider]  ramipril (ALTACE) 10 MG capsule Take 10 mg by mouth daily.    [provider]  simvastatin (ZOCOR) 40 MG tablet Take 40 mg by mouth at bedtime.    [provider]  sodium chloride (OCEAN) 0.65 % SOLN nasal spray Place 1 spray into both nostrils at bedtime.    [provider]  tamsulosin (FLOMAX) 0.4 MG CAPS capsule Take 0.4 mg by mouth daily.    [provider]  vitamin B-12 (CYANOCOBALAMIN) 1000 MCG tablet Take 1,000 mcg by mouth daily.    [provider]      Allergies    Metformin hcl and Penicillins    Review of Systems   Review of Systems  Constitutional:  Positive for fever.  Neurological:  Positive for weakness.    Physical Exam Updated Vital Signs BP (!) 149/89 (BP Location: Left Arm)   Pulse 70   Temp 98.9 F (37.2 C) (Oral)   Resp (!) 37   Ht 5\' 5"  (1.651 m)   Wt 76.2 kg   SpO2 93%   BMI 27.96 kg/m  Physical Exam Vitals and nursing note reviewed.  Constitutional:      General:  He is not in acute distress. HENT:     Head: Normocephalic and atraumatic.     Mouth/Throat:     Mouth: Mucous membranes are moist.  Eyes:     General: No scleral icterus.       Right eye: No discharge.        Left eye: No discharge.     Conjunctiva/sclera: Conjunctivae normal.  Cardiovascular:     Rate and Rhythm: Normal rate. Rhythm irregular.     Pulses: Normal pulses.     Heart sounds: Normal heart sounds. No murmur heard. Pulmonary:     Effort: Pulmonary effort is normal.     Breath sounds: Normal breath sounds. No wheezing, rhonchi or rales.     Comments: Patient appears to be in mild-moderate respiratory distress but is able to complete sentences Abdominal:      General: Abdomen is flat. Bowel sounds are normal. There is no distension.     Palpations: Abdomen is soft. There is no mass.     Tenderness: There is no abdominal tenderness.  Musculoskeletal:     Right lower leg: No edema.     Left lower leg: No edema.  Skin:    General: Skin is warm and dry.     Findings: No rash.  Neurological:     General: No focal deficit present.     Mental Status: He is alert. Mental status is at baseline.     Comments: GCS 15. Speech is goal oriented. No deficits appreciated to CN III-XII; symmetric eyebrow raise, no facial drooping, tongue midline. Patient has equal grip strength bilaterally with 5/5 strength against resistance in all major muscle groups bilaterally. Sensation to light touch intact. Patient moves extremities without ataxia. Patient ambulatory with steady gait.   Psychiatric:        Mood and Affect: Mood normal.        Behavior: Behavior normal.     ED Results / Procedures / Treatments   Labs (all labs ordered are listed, but only abnormal results are displayed) Labs Reviewed  BASIC METABOLIC PANEL - Abnormal; Notable for the following components:      Result Value   Sodium 130 (*)    Potassium 3.0 (*)    Chloride 96 (*)    Glucose, Bld 67 (*)    Calcium 8.6 (*)    All other components within normal limits  CBC - Abnormal; Notable for the following components:   WBC 14.1 (*)    All other components within normal limits  URINALYSIS, ROUTINE W REFLEX MICROSCOPIC - Abnormal; Notable for the following components:   Glucose, UA >=500 (*)    Hgb urine dipstick MODERATE (*)    Ketones, ur 20 (*)    Protein, ur 100 (*)    Bacteria, UA RARE (*)    All other components within normal limits  CBG MONITORING, ED - Abnormal; Notable for the following components:   Glucose-Capillary 55 (*)    All other components within normal limits  I-STAT CG4 LACTIC ACID, ED - Abnormal; Notable for the following components:   Lactic Acid, Venous 3.3  (*)    All other components within normal limits  CBG MONITORING, ED - Abnormal; Notable for the following components:   Glucose-Capillary 102 (*)    All other components within normal limits  I-STAT CG4 LACTIC ACID, ED - Abnormal; Notable for the following components:   Lactic Acid, Venous 2.6 (*)    All other components within normal limits  RESP PANEL BY RT-PCR (RSV, FLU A&B, COVID)  RVPGX2  CULTURE, BLOOD (ROUTINE X 2)  CULTURE, BLOOD (ROUTINE X 2)  PHOSPHORUS  CBG MONITORING, ED  CBG MONITORING, ED  CBG MONITORING, ED    EKG EKG Interpretation Date/Time:  Wednesday October 02 2023 10:46:50 EDT Ventricular Rate:  86 PR Interval:  47 QRS Duration:  126 QT Interval:  430 QTC Calculation: 515 R Axis:   14  Text Interpretation: Unknown rhythm, irregular rate Short PR interval Left bundle branch block Confirmed by Gwyneth Sprout ([id number]) on 10/02/2023 3:13:19 PM  Radiology CT CHEST ABDOMEN PELVIS W CONTRAST  Result Date: 10/02/2023 CLINICAL DATA:  Fatigue, congestion, fever, nausea, general weakness, cough EXAM: CT CHEST, ABDOMEN, AND PELVIS WITH CONTRAST TECHNIQUE: Multidetector CT imaging of the chest, abdomen and pelvis was performed following the standard protocol during bolus administration of intravenous contrast. RADIATION DOSE REDUCTION: This exam was performed according to the departmental dose-optimization program which includes automated exposure control, adjustment of the mA and/or kV according to patient size and/or use of iterative reconstruction technique. CONTRAST:  OMNIPAQUE IOHEXOL 300 MG/ML  SOLN COMPARISON:  Chest radiograph 10/02/2023 and CT abdomen and pelvis 08/17/2015 FINDINGS: CT CHEST FINDINGS Cardiovascular: No pericardial effusion. Coronary artery and aortic atherosclerotic calcification. Left chest wall ICD. Mediastinum/Nodes: Trachea and esophagus are unremarkable. Mediastinal and hilar lymphadenopathy. For example 1.3 cm left paratracheal node on  series 2/image 23 and subcarinal node measuring 1.5 cm on series 2/image 32. Lungs/Pleura: Left lower lobe consolidation, ground-glass opacities, and interlobular septal thickening. Trace left pleural effusion. The right lung is clear. Musculoskeletal: No acute fracture. CT ABDOMEN PELVIS FINDINGS Hepatobiliary: Cholelithiasis without evidence of cholecystitis. No biliary dilation. Hepatic steatosis. Pancreas: Unremarkable. Spleen: Unremarkable. Adrenals/Urinary Tract: Stable adrenal glands. Low-attenuation lesions in the kidneys are statistically likely to represent cysts. No follow-up is required. Chronic scarring/infarct in the posterior right kidney. Unremarkable bladder. Stomach/Bowel: Normal caliber large and small bowel. Colonic diverticulosis without diverticulitis. The sigmoid colon herniates into the left inguinal hernia, unchanged from 2016. No evidence of obstruction or strangulation. Stomach is within normal limits. The appendix is not visualized. Vascular/Lymphatic: Aortic atherosclerosis. No enlarged abdominal or pelvic lymph nodes. Reproductive: Enlarged prostate. Other: No free intraperitoneal fluid or air. Musculoskeletal: Postoperative changes left femur. No acute fracture. IMPRESSION: 1. Left lower lobe pneumonia. Follow-up in 6-8 weeks after treatment is recommended to ensure resolution. 2. Mediastinal and hilar lymphadenopathy, likely reactive. Continued attention on follow-up. 3. No acute abnormality in the abdomen or pelvis. 4. Hepatic steatosis. 5. Unchanged herniation of the sigmoid colon into the left inguinal hernia. No evidence of obstruction or strangulation. Aortic Atherosclerosis (ICD10-I70.0). Electronically Signed   By: Minerva Fester M.D.   On: 10/02/2023 18:54   DG Chest Portable 1 View  Result Date: 10/02/2023 CLINICAL DATA:  Fatigue.  Congestion.  Fever. EXAM: PORTABLE CHEST 1 VIEW COMPARISON:  Chest radiograph dated September 30, 2023. FINDINGS: The heart size and  mediastinal contours are unchanged. Aortic atherosclerosis. Stable left internal jugular approach dual lead cardiac device. No focal consolidation, pneumothorax, or sizable pleural effusion. The visualized skeletal structures are unchanged. IMPRESSION: No active cardiopulmonary disease. Electronically Signed   By: Hart Robinsons M.D.   On: 10/02/2023 14:53   CUP PACEART REMOTE DEVICE CHECK  Result Date: 10/02/2023 Scheduled remote reviewed. Normal device function.  1 NSVT, 12 beats in duration 4 brief ATR's, hx of PAF, Eliquis per PA report There has been an increase in RR and mean HR per trends since  9/28 Next remote 91 days. LA, CVRS   Procedures .Critical Care  Performed by: Dorthy Cooler, PA-C Authorized by: Dorthy Cooler, PA-C   Critical care provider statement:    Critical care time (minutes):  30   Critical care was necessary to treat or prevent imminent or life-threatening deterioration of the following conditions:  Sepsis   Critical care was time spent personally by me on the following activities:  Development of treatment plan with patient or surrogate, discussions with consultants, evaluation of patient's response to treatment, examination of patient, ordering and review of laboratory studies, ordering and review of radiographic studies, ordering and performing treatments and interventions, pulse oximetry, re-evaluation of patient's condition and review of old charts   I assumed direction of critical care for this patient from another provider in my specialty: yes     Care discussed with: admitting provider       Medications Ordered in ED Medications  ondansetron (ZOFRAN) injection 4 mg (has no administration in time range)  potassium chloride SA (KLOR-CON M) CR tablet 40 mEq (40 mEq Oral Given 10/02/23 1502)  sodium chloride 0.9 % bolus 1,000 mL (0 mLs Intravenous Stopped 10/02/23 1538)  ceFEPIme (MAXIPIME) 2 g in sodium chloride 0.9 % 100 mL IVPB (0 g Intravenous  Stopped 10/02/23 1727)  metroNIDAZOLE (FLAGYL) IVPB 500 mg (0 mg Intravenous Stopped 10/02/23 1728)  vancomycin (VANCOREADY) IVPB 1750 mg/350 mL (1,750 mg Intravenous New Bag/Given 10/02/23 1730)  lactated ringers bolus 1,000 mL (0 mLs Intravenous Stopped 10/02/23 1851)  iohexol (OMNIPAQUE) 300 MG/ML solution 100 mL (100 mLs Intravenous Contrast Given 10/02/23 1744)  acetaminophen (TYLENOL) tablet 1,000 mg (1,000 mg Oral Given 10/02/23 1856)    ED Course/ Medical Decision Making/ A&P                                 Medical Decision Making Amount and/or Complexity of Data Reviewed Labs: ordered. Radiology: ordered.  Risk OTC drugs. Prescription drug management.   This patient presents to the ED for concern of fatigue, congestion, fever, and generalized weakness, this involves an extensive number of treatment options, and is a complaint that carries with it a high risk of complications and morbidity.  The differential diagnosis includes viral illness, PNA, sepsis, sinusitis, intraabdominal infection/UTI/Cholecystitis/Pancreatitis   Co morbidities that complicate the patient evaluation  anemia, arthritis, DM, HLD, HTN, afib    Additional history obtained:  Patient was seen in ED 09/30/2023 with reassuring workup - discharged home with return precautions   Lab Tests:  I Ordered, and personally interpreted labs.  The pertinent results include:   - BMP: hyponatremia at 130; hypokalemia at 3.0; chloride 96, calcium 8.6 - CBC: leukocytosis at 14.1; no anemia - Resp panel: negative - UA: not concerning for infection - CBG: 55 -> 102 after a meal - LA: 3.3 -> 2.6 - Blood cultures: pending - Phosphorus: pending   Imaging Studies ordered:  I ordered imaging studies including  -chest xray: To assess for process contributing patient's symptoms - CT chest/abd/pelv: To assess for source of infection I independently visualized and interpreted imaging I agree with the radiologist  interpretation   Cardiac Monitoring: / EKG:  The patient was maintained on a cardiac monitor.  I personally viewed and interpreted the cardiac monitored which showed an underlying rhythm of: afib   Problem List / ED Course / Critical interventions / Medication management  Admitting patient for Sepsis and  PNA. Presents to ED concerned for fatigue, congestion, fever, anorexia, and generalized weakness. Patient stating that the generalized weakness caused him to fall yesterday. Vitals signs with fever (101.81F) and tachycardia, and mild-moderate respiratory distress. No other pertinent physical exam findings. Upon initial interview, patient met SIRs criteria with tachycardia and leukocytosis.  Started sepsis workup including broad-spectrum antibiotics and IV fluids. Provided Tylenol for the fever.  CBC with leukocytosis at 14.1.  BMP with hyponatremia at 130, hypokalemia at 3.0.  CBG initially 55 which increased to 102 after drinking juice.  Respiratory panel negative.  UA not concerning for infection.  Lactic acid initially 3.3 which decreased to 2.6 with IV fluids.  Chest x-ray without acute cardiopulmonary disease.  Obtaining CT chest/abdomen/pelvis showing left lower lobe PNA. Consulted with Hospitalist Dr. Gretel Acre who agrees to admit patient.  I have reviewed the patients home medicines and have made adjustments as needed   Social Determinants of Health:  none          Final Clinical Impression(s) / ED Diagnoses Final diagnoses:  Sepsis without acute organ dysfunction, due to unspecified organism [facility])  Pneumonia of left lower lobe due to infectious organism    Rx / DC Orders ED Discharge Orders     None         Margarita Rana 10/02/23 1945    Gwyneth Sprout, MD 10/05/23 [id number]    Gwyneth Sprout, MD 10/05/23 [id number]

## 2023-10-02 NOTE — Assessment & Plan Note (Signed)
-  secondary to community acquired pneumonia -presented with fever, tachycardia and tachypnea  -Given broad spectrum antibiotics in ED, continue with IV Rocephin and Azithromycin -keep on continuous IV fluid overnight

## 2023-10-02 NOTE — Progress Notes (Signed)
A consult was received from an ED provider for cefepime and vancomycin per pharmacy dosing.  The patient's profile has been reviewed for ht/wt/allergies/indication/available labs.    A one time order has been placed for cefepime 2 g and vancomycin 1750 mg IV.    Further antibiotics/pharmacy consults should be ordered by admitting physician if indicated.                        Thank you for allowing pharmacy to be a part of this patient's care.  Selinda Eon, PharmD, BCPS Clinical Pharmacist [place] 10/02/2023 3:21 PM

## 2023-10-02 NOTE — Assessment & Plan Note (Signed)
Secondary to complete heart block

## 2023-10-02 NOTE — Assessment & Plan Note (Signed)
continue Eliquis Currently bradycardic.  Discussed with family at this point would like to concentrate on comfort discontinue telemetry   

## 2023-10-02 NOTE — Assessment & Plan Note (Signed)
Oral potassium administered.

## 2023-10-03 DIAGNOSIS — A419 Sepsis, unspecified organism: Secondary | ICD-10-CM | POA: Diagnosis not present

## 2023-10-03 LAB — CBC
HCT: 39.1 % (ref 39.0–52.0)
Hemoglobin: 13.1 g/dL (ref 13.0–17.0)
MCH: 31 pg (ref 26.0–34.0)
MCHC: 33.5 g/dL (ref 30.0–36.0)
MCV: 92.4 fL (ref 80.0–100.0)
Platelets: 165 10*3/uL (ref 150–400)
RBC: 4.23 MIL/uL (ref 4.22–5.81)
RDW: 13.6 % (ref 11.5–15.5)
WBC: 12.2 10*3/uL — ABNORMAL HIGH (ref 4.0–10.5)
nRBC: 0 % (ref 0.0–0.2)

## 2023-10-03 LAB — RESPIRATORY PANEL BY PCR

## 2023-10-03 LAB — BASIC METABOLIC PANEL
Anion gap: 13 (ref 5–15)
BUN: 17 mg/dL (ref 8–23)
CO2: 19 mmol/L — ABNORMAL LOW (ref 22–32)
Calcium: 8.2 mg/dL — ABNORMAL LOW (ref 8.9–10.3)
Chloride: 100 mmol/L (ref 98–111)
Creatinine, Ser: 0.63 mg/dL (ref 0.61–1.24)
GFR, Estimated: >60 mL/min (ref >60)
Glucose, Bld: 98 mg/dL (ref 70–99)
Potassium: 3.5 mmol/L (ref 3.5–5.1)
Sodium: 132 mmol/L — ABNORMAL LOW (ref 135–145)

## 2023-10-03 LAB — PHOSPHORUS: Phosphorus: 1.8 mg/dL — ABNORMAL LOW (ref 2.5–4.6)

## 2023-10-03 LAB — GLUCOSE, CAPILLARY
Glucose-Capillary: 103 mg/dL — ABNORMAL HIGH (ref 70–99)
Glucose-Capillary: 135 mg/dL — ABNORMAL HIGH (ref 70–99)

## 2023-10-03 LAB — CBG MONITORING, ED: Glucose-Capillary: 194 mg/dL — ABNORMAL HIGH (ref 70–99)

## 2023-10-03 MED ORDER — BENZONATATE 100 MG PO CAPS
100.0000 mg | ORAL_CAPSULE | Freq: Three times a day (TID) | ORAL | Status: DC
  Administered 2023-10-03 – 2023-10-07 (×13): 100 mg via ORAL
  Filled 2023-10-03 (×13): qty 1

## 2023-10-03 MED ORDER — ENSURE ENLIVE PO LIQD
237.0000 mL | Freq: Three times a day (TID) | ORAL | Status: DC
  Administered 2023-10-03 – 2023-10-04 (×3): 237 mL via ORAL

## 2023-10-03 MED ORDER — SODIUM CHLORIDE 0.9 % IV SOLN
2.0000 g | INTRAVENOUS | Status: DC
  Administered 2023-10-03 – 2023-10-04 (×2): 2 g via INTRAVENOUS
  Filled 2023-10-03 (×3): qty 20

## 2023-10-03 MED ORDER — ALBUTEROL SULFATE (2.5 MG/3ML) 0.083% IN NEBU
2.5000 mg | INHALATION_SOLUTION | RESPIRATORY_TRACT | Status: DC | PRN
  Administered 2023-10-03 (×2): 2.5 mg via RESPIRATORY_TRACT
  Filled 2023-10-03: qty 3

## 2023-10-03 MED ORDER — LACTATED RINGERS IV SOLN: INTRAVENOUS | Status: AC

## 2023-10-03 MED ORDER — IPRATROPIUM-ALBUTEROL 0.5-2.5 (3) MG/3ML IN SOLN
3.0000 mL | Freq: Three times a day (TID) | RESPIRATORY_TRACT | Status: DC
  Administered 2023-10-04 – 2023-10-06 (×7): 3 mL via RESPIRATORY_TRACT
  Filled 2023-10-03 (×8): qty 3

## 2023-10-03 MED ORDER — ALBUTEROL SULFATE (2.5 MG/3ML) 0.083% IN NEBU
2.5000 mg | INHALATION_SOLUTION | Freq: Four times a day (QID) | RESPIRATORY_TRACT | Status: DC | PRN
  Administered 2023-10-04: 2.5 mg via RESPIRATORY_TRACT
  Filled 2023-10-03: qty 3

## 2023-10-03 NOTE — Hospital Course (Addendum)
Brief hospital course: PMH of PAF, type II DM, HLD, CHB SP ICD implant presented to hospital with complaints of fever and chills as well as shortness of breath and cough. Currently maintained for community-acquired pneumonia.  Assessment and Plan: Sepsis secondary to community-acquired pneumonia. Present on admission. Meeting SIRS criteria on admission with fever tachycardia and tachypnea. Blood cultures done in the ED few days ago was negative.  Repeat blood culture this admission is also negative. Currently on IV ceftriaxone and oral azithromycin. procalcitonin is elevated. Continue IV fluid for supportive measures. COVID, flu, RVP negative. Will initiate steroids due to ongoing respiratory distress and monitor.  Type 2 diabetes mellitus, uncontrolled with hyperglycemia without long-term insulin use. Without any complication. Monitor for now that he will be on steroids.  History of ICD implant. Blood cultures negative which is reassuring. Monitor for now.  Paroxysmal A-fib. On Eliquis. Monitor for now.  HLD. Continue statin.
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
## 2023-10-03 NOTE — Progress Notes (Signed)
[facility] Progress Note Patient: Benjamin Fuller WUJ:[id number] DOB: 08/27/1941 DOA: 10/02/2023  DOS: the patient was seen and examined on 10/03/2023  Brief hospital course: PMH of PAF, type II DM, HLD, CHB SP ICD implant presented to hospital with complaints of fever and chills as well as shortness of breath and cough. Currently maintained for community-acquired pneumonia.  Assessment and Plan: Sepsis secondary to community-acquired pneumonia. Present on admission.  Meeting SIRS criteria on admission with fever tachycardia and tachypnea. Blood cultures done in the ED few days ago was negative. Currently on IV antibiotic. Will monitor blood cultures performed again. Check procalcitonin. Continue IV fluid for supportive measures. COVID, flu, RVP negative. Low threshold to initiate steroids.  Type 2 diabetes mellitus, uncontrolled with hyperglycemia without long-term insulin use. Without any complication. Monitor for now.  History of ICD implant. Blood cultures negative which is reassuring. Monitor for now.  Paroxysmal A-fib. On Eliquis. Monitor for now.  HLD. Continue statin.   Subjective: Severe shortness of breath.  No nausea no vomiting.  Continues to have chills.  Physical Exam: General: in moderate distress, No Rash, oral mucosa dry Cardiovascular: S1 and S2 Present, No Murmur Respiratory: Increased respiratory effort, Bilateral Air entry present.  Bilateral crackles, No wheezes Abdomen: Bowel Sound present, No tenderness Extremities: No edema Neuro: Alert and oriented x3, no new focal deficit  Data Reviewed: I have Reviewed nursing notes, Vitals, and Lab results. Since last encounter, pertinent lab results CBC and BMP   . I have ordered test including CBC and BMP  .   Disposition: Status is: Inpatient Remains inpatient appropriate because: Need for further workup  apixaban (ELIQUIS) tablet 5 mg   Family Communication: Wife at bedside Level of care:  Telemetry continue due to tachycardia Vitals:   10/03/23 1214 10/03/23 1218 10/03/23 1225 10/03/23 1651  BP:  (!) 146/68 128/76 132/70  Pulse:  70 70 70  Resp:  (!) 22 18 18   Temp:  98.6 F (37 C) 98.6 F (37 C) 98.3 F (36.8 C)  TempSrc:  Oral Oral Oral  SpO2: 96% 98% 98% 97%  Weight:      Height:         Author: Lynden Oxford, MD 10/03/2023 7:24 PM  Please look on [email or web address] to find out who is on call.

## 2023-10-04 DIAGNOSIS — A419 Sepsis, unspecified organism: Secondary | ICD-10-CM | POA: Diagnosis not present

## 2023-10-04 LAB — CBC WITH DIFFERENTIAL/PLATELET
Abs Immature Granulocytes: 0.04 10*3/uL (ref 0.00–0.07)
Basophils Absolute: 0 10*3/uL (ref 0.0–0.1)
Basophils Relative: 0 %
Eosinophils Absolute: 0.1 10*3/uL (ref 0.0–0.5)
Eosinophils Relative: 1 %
HCT: 36.1 % — ABNORMAL LOW (ref 39.0–52.0)
Hemoglobin: 12.3 g/dL — ABNORMAL LOW (ref 13.0–17.0)
Immature Granulocytes: 0 %
Lymphocytes Relative: 11 %
Lymphs Abs: 1.1 10*3/uL (ref 0.7–4.0)
MCH: 31.1 pg (ref 26.0–34.0)
MCHC: 34.1 g/dL (ref 30.0–36.0)
MCV: 91.2 fL (ref 80.0–100.0)
Monocytes Absolute: 0.6 10*3/uL (ref 0.1–1.0)
Monocytes Relative: 6 %
Neutro Abs: 8.1 10*3/uL — ABNORMAL HIGH (ref 1.7–7.7)
Neutrophils Relative %: 82 %
Platelets: 163 10*3/uL (ref 150–400)
RBC: 3.96 MIL/uL — ABNORMAL LOW (ref 4.22–5.81)
RDW: 13.6 % (ref 11.5–15.5)
WBC: 9.9 10*3/uL (ref 4.0–10.5)
nRBC: 0 % (ref 0.0–0.2)

## 2023-10-04 LAB — BASIC METABOLIC PANEL
Anion gap: 10 (ref 5–15)
BUN: 21 mg/dL (ref 8–23)
CO2: 21 mmol/L — ABNORMAL LOW (ref 22–32)
Calcium: 8 mg/dL — ABNORMAL LOW (ref 8.9–10.3)
Chloride: 100 mmol/L (ref 98–111)
Creatinine, Ser: 0.61 mg/dL (ref 0.61–1.24)
GFR, Estimated: >60 mL/min (ref >60)
Glucose, Bld: 122 mg/dL — ABNORMAL HIGH (ref 70–99)
Potassium: 3.2 mmol/L — ABNORMAL LOW (ref 3.5–5.1)
Sodium: 131 mmol/L — ABNORMAL LOW (ref 135–145)

## 2023-10-04 LAB — MAGNESIUM: Magnesium: 2.2 mg/dL (ref 1.7–2.4)

## 2023-10-04 LAB — PROCALCITONIN: Procalcitonin: 1.14 ng/mL

## 2023-10-04 LAB — PHOSPHORUS: Phosphorus: 2.2 mg/dL — ABNORMAL LOW (ref 2.5–4.6)

## 2023-10-04 LAB — C-REACTIVE PROTEIN: CRP: 26.5 mg/dL — ABNORMAL HIGH (ref <1.0)

## 2023-10-04 MED ORDER — AZITHROMYCIN 250 MG PO TABS
500.0000 mg | ORAL_TABLET | Freq: Every day | ORAL | Status: DC
  Administered 2023-10-04 – 2023-10-06 (×3): 500 mg via ORAL
  Filled 2023-10-04 (×3): qty 2

## 2023-10-04 MED ORDER — K PHOS MONO-SOD PHOS DI & MONO 155-852-130 MG PO TABS
500.0000 mg | ORAL_TABLET | Freq: Three times a day (TID) | ORAL | Status: DC
  Administered 2023-10-04: 500 mg via ORAL
  Filled 2023-10-04 (×2): qty 2

## 2023-10-04 MED ORDER — POTASSIUM PHOSPHATES 15 MMOLE/5ML IV SOLN: 15.0000 mmol | Freq: Once | INTRAVENOUS | Status: DC

## 2023-10-04 MED ORDER — POTASSIUM CHLORIDE CRYS ER 20 MEQ PO TBCR
40.0000 meq | EXTENDED_RELEASE_TABLET | ORAL | Status: AC
  Administered 2023-10-04 (×2): 40 meq via ORAL
  Filled 2023-10-04 (×2): qty 2

## 2023-10-04 MED ORDER — METHYLPREDNISOLONE SODIUM SUCC 40 MG IJ SOLR
40.0000 mg | Freq: Two times a day (BID) | INTRAMUSCULAR | Status: DC
  Administered 2023-10-04 – 2023-10-05 (×2): 40 mg via INTRAVENOUS
  Filled 2023-10-04 (×2): qty 1

## 2023-10-04 NOTE — Progress Notes (Signed)
Spoke with RN concerning PT shortness of breath (SOB). BBS are clear-diminished, Sp02 on 2 LPM good (left on for SOB). PT states he feels very SOB (RR 21). 10/03/23 CT does indicate LLL  ground glass opacity with signs of small pleural effusion. PT is sweaty at this time and restless at this time.

## 2023-10-04 NOTE — Evaluation (Signed)
Physical Therapy Evaluation Patient Details Name: Benjamin Fuller MRN: [id number] DOB: 1941/12/05 Today's Date: 10/04/2023  History of Present Illness  82 yo male presents to therapy following hospital admission on 10/02/2023 due to sepsis secondary to PNA and fall 10/1. Pt had presented to ED 09/30/2023 with similar c/o fatigue, congestion, fever and nausea and symptoms have not resolved. Pt has PMH including but not limited to: DM II, arthritis, anemia, A-fib, HTN, HLD, LBBB, and pacemaker placement.  Clinical Impression      Pt admitted with above diagnosis. Pt currently with functional limitations due to the deficits listed below (see PT Problem List). Pt arrived and nurse in room. Nurse indicated pt needed to void but has not been OOB since arrival to hospital. Nurse reported use of bed pan. PT inquired if pt would be able to try [facility]. Nurse reported pt has been exhibiting progressive increased SOB and not medically ready for OOB at this time. Nurse requested assist for rolling to place bed pan. PT facilitated rolling in supine side to side with min A and use of bed rails. Pt on 2 L/min reporting SOB and noted use of accessory mm for breathing and o2 saturation 94% with assisted mobility. Pt indicates no supplemental O2 PLOF. Pt left in bed and all needs in place. Pt will benefit from acute skilled PT to increase their independence and safety with mobility to allow discharge.       If plan is discharge home, recommend the following: A lot of help with walking and/or transfers;A lot of help with bathing/dressing/bathroom;Assistance with cooking/housework;Assist for transportation;Help with stairs or ramp for entrance   Can travel by private vehicle        Equipment Recommendations Other (comment) (to be determined as pt is medically able to safely progress with transfsers and gait tasks)  Recommendations for Other Services       Functional Status Assessment Patient has had a recent decline in  their functional status and demonstrates the ability to make significant improvements in function in a reasonable and predictable amount of time.     Precautions / Restrictions Precautions Precautions: Fall Restrictions Weight Bearing Restrictions: No      Mobility  Bed Mobility Overal bed mobility: Needs Assistance Bed Mobility: Rolling Rolling: Min assist, Used rails         General bed mobility comments: PT evaluation limited today per request of nursing staff due to progressive increased difficulty with breathing.    Transfers                        Ambulation/Gait                  Stairs            Wheelchair Mobility     Tilt Bed    Modified Rankin (Stroke Patients Only)       Balance                                             Pertinent Vitals/Pain Pain Assessment Pain Assessment: No/denies pain    Home Living Family/patient expects to be discharged to:: Private residence Living Arrangements: Spouse/significant other Available Help at Discharge: Family Type of Home: House Home Access: Stairs to enter Entrance Stairs-Rails: Right Entrance Stairs-Number of Steps: 6   Home Layout: One level (pt stated  it is basically one level) Home Equipment: None Additional Comments: pt was participating with OPPT to address B LE weakness and stretch his legs    Prior Function Prior Level of Function : Independent/Modified Independent;Driving             Mobility Comments: IND with all ADLs self care tasks and IADLs       Extremity/Trunk Assessment        Lower Extremity Assessment Lower Extremity Assessment: Generalized weakness       Communication   Communication Communication: Hearing impairment (increased volume)  Cognition Arousal: Alert Behavior During Therapy: WFL for tasks assessed/performed Overall Cognitive Status: Within Functional Limits for tasks assessed                                           General Comments      Exercises     Assessment/Plan    PT Assessment Patient needs continued PT services  PT Problem List Decreased activity tolerance;Decreased balance;Decreased mobility;Decreased coordination;Cardiopulmonary status limiting activity       PT Treatment Interventions DME instruction;Gait training;Stair training;Functional mobility training;Therapeutic activities;Therapeutic exercise;Balance training;Neuromuscular re-education;Patient/family education    PT Goals (Current goals can be found in the Care Plan section)  Acute Rehab PT Goals Patient Stated Goal: to be able to to breath better, go home and return to [employer] OPPT PT Goal Formulation: With patient Time For Goal Achievement: 10/18/23 Potential to Achieve Goals: Good    Frequency Min 1X/week     Co-evaluation               AM-PAC PT "6 Clicks" Mobility  Outcome Measure Help needed turning from your back to your side while in a flat bed without using bedrails?: A Little Help needed moving from lying on your back to sitting on the side of a flat bed without using bedrails?: A Little Help needed moving to and from a bed to a chair (including a wheelchair)?: Total Help needed standing up from a chair using your arms (e.g., wheelchair or bedside chair)?: Total Help needed to walk in hospital room?: Total Help needed climbing 3-5 steps with a railing? : Total 6 Click Score: 10    End of Session Equipment Utilized During Treatment: Oxygen Activity Tolerance: Treatment limited secondary to medical complications (Comment) (nursing request seconday to increased difficutly with breathing) Patient left: in bed;with bed alarm set;with call bell/phone within reach;with family/visitor present   PT Visit Diagnosis: Unsteadiness on feet (R26.81);Other abnormalities of gait and mobility (R26.89)    Time: 4098-1191 PT Time Calculation (min) (ACUTE ONLY): 11 min   Charges:    PT Evaluation $PT Eval Moderate Complexity: 1 Mod   PT General Charges $$ ACUTE PT VISIT: 1 Visit         Johnny Bridge, PT Acute Rehab   Jacqualyn Posey 10/04/2023, 2:55 PM

## 2023-10-04 NOTE — Progress Notes (Signed)
[facility] Progress Note Patient: Benjamin Fuller ZOX:[id number] DOB: 1941-01-03 DOA: 10/02/2023  DOS: the patient was seen and examined on 10/04/2023  Brief hospital course: PMH of PAF, type II DM, HLD, CHB SP ICD implant presented to hospital with complaints of fever and chills as well as shortness of breath and cough. Currently maintained for community-acquired pneumonia.  Assessment and Plan: Sepsis secondary to community-acquired pneumonia. Present on admission. Meeting SIRS criteria on admission with fever tachycardia and tachypnea. Blood cultures done in the ED few days ago was negative.  Repeat blood culture this admission is also negative. Currently on IV ceftriaxone and oral azithromycin. procalcitonin is elevated. Continue IV fluid for supportive measures. COVID, flu, RVP negative. Will initiate steroids due to ongoing respiratory distress and monitor.  Type 2 diabetes mellitus, uncontrolled with hyperglycemia without long-term insulin use. Without any complication. Monitor for now that he will be on steroids.  History of ICD implant. Blood cultures negative which is reassuring. Monitor for now.  Paroxysmal A-fib. On Eliquis. Monitor for now.  HLD. Continue statin.   Subjective: Continues to have shortness of breath.  No nausea no vomiting.  Oral intake improving but minimal.  Physical Exam: General: in moderate distress, No Rash Cardiovascular: S1 and S2 Present, No Murmur Respiratory: Increased respiratory effort, Bilateral Air entry present.  Bilateral crackles, occasional wheezes Abdomen: Bowel Sound present, No tenderness Extremities: No edema Neuro: Alert and oriented x3, no new focal deficit  Data Reviewed: I have Reviewed nursing notes, Vitals, and Lab results. Since last encounter, pertinent lab results CBC and BMP   . I have ordered test including CBC and BMP  .   Disposition: Status is: Inpatient Remains inpatient appropriate because:  Continue antibiotic and monitor for improvement in respiratory distress apixaban (ELIQUIS) tablet 5 mg   Family Communication: Discussed with wife on the phone Level of care: Telemetry continue telemetry for now due to respiratory distress Vitals:   10/04/23 0452 10/04/23 0833 10/04/23 1401 10/04/23 1434  BP: (!) 150/81   (!) 150/78  Pulse: 70   67  Resp: 16   18  Temp: (!) 97.5 F (36.4 C)   97.6 F (36.4 C)  TempSrc: Oral   Oral  SpO2: 100% 97% 97% 98%  Weight:      Height:         Author: Lynden Oxford, MD 10/04/2023 5:35 PM  Please look on [email or web address] to find out who is on call.

## 2023-10-05 DIAGNOSIS — A419 Sepsis, unspecified organism: Secondary | ICD-10-CM | POA: Diagnosis not present

## 2023-10-05 LAB — BASIC METABOLIC PANEL
Anion gap: 13 (ref 5–15)
BUN: 20 mg/dL (ref 8–23)
CO2: 19 mmol/L — ABNORMAL LOW (ref 22–32)
Calcium: 8.5 mg/dL — ABNORMAL LOW (ref 8.9–10.3)
Chloride: 100 mmol/L (ref 98–111)
Creatinine, Ser: 0.77 mg/dL (ref 0.61–1.24)
GFR, Estimated: >60 mL/min (ref >60)
Glucose, Bld: 255 mg/dL — ABNORMAL HIGH (ref 70–99)
Potassium: 3.7 mmol/L (ref 3.5–5.1)
Sodium: 132 mmol/L — ABNORMAL LOW (ref 135–145)

## 2023-10-05 LAB — MAGNESIUM: Magnesium: 2 mg/dL (ref 1.7–2.4)

## 2023-10-05 MED ORDER — PREDNISONE 50 MG PO TABS
50.0000 mg | ORAL_TABLET | Freq: Every day | ORAL | Status: DC
  Administered 2023-10-06: 50 mg via ORAL
  Filled 2023-10-05: qty 1

## 2023-10-05 MED ORDER — CEFDINIR 300 MG PO CAPS
300.0000 mg | ORAL_CAPSULE | Freq: Two times a day (BID) | ORAL | Status: DC
  Administered 2023-10-05 – 2023-10-07 (×5): 300 mg via ORAL
  Filled 2023-10-05 (×6): qty 1

## 2023-10-05 NOTE — Plan of Care (Signed)
  Problem: Education: Goal: Knowledge of General Education information will improve Description: Including pain rating scale, medication(s)/side effects and non-pharmacologic comfort measures Outcome: Progressing   Problem: Education: Goal: Knowledge of General Education information will improve Description: Including pain rating scale, medication(s)/side effects and non-pharmacologic comfort measures Outcome: Progressing   Problem: Health Behavior/Discharge Planning: Goal: Ability to manage health-related needs will improve Outcome: Progressing   Problem: Health Behavior/Discharge Planning: Goal: Ability to manage health-related needs will improve Outcome: Progressing   Problem: Clinical Measurements: Goal: Ability to maintain clinical measurements within normal limits will improve Outcome: Progressing Goal: Will remain free from infection Outcome: Progressing Goal: Respiratory complications will improve Outcome: Progressing   Problem: Clinical Measurements: Goal: Ability to maintain clinical measurements within normal limits will improve Outcome: Progressing   Problem: Clinical Measurements: Goal: Will remain free from infection Outcome: Progressing   Problem: Clinical Measurements: Goal: Respiratory complications will improve Outcome: Progressing   Problem: Activity: Goal: Risk for activity intolerance will decrease Outcome: Progressing   Problem: Activity: Goal: Risk for activity intolerance will decrease Outcome: Progressing   Problem: Nutrition: Goal: Adequate nutrition will be maintained Outcome: Progressing   Problem: Nutrition: Goal: Adequate nutrition will be maintained Outcome: Progressing   Problem: Coping: Goal: Level of anxiety will decrease Outcome: Progressing   Problem: Coping: Goal: Level of anxiety will decrease Outcome: Progressing   Problem: Elimination: Goal: Will not experience complications related to bowel motility Outcome:  Progressing Goal: Will not experience complications related to urinary retention Outcome: Progressing   Problem: Elimination: Goal: Will not experience complications related to bowel motility Outcome: Progressing   Problem: Elimination: Goal: Will not experience complications related to urinary retention Outcome: Progressing   Problem: Pain Managment: Goal: General experience of comfort will improve Outcome: Progressing   Problem: Pain Managment: Goal: General experience of comfort will improve Outcome: Progressing   Problem: Safety: Goal: Ability to remain free from injury will improve Outcome: Progressing   Problem: Safety: Goal: Ability to remain free from injury will improve Outcome: Progressing   Problem: Skin Integrity: Goal: Risk for impaired skin integrity will decrease Outcome: Progressing   Problem: Skin Integrity: Goal: Risk for impaired skin integrity will decrease Outcome: Progressing

## 2023-10-05 NOTE — Progress Notes (Signed)
[facility] Progress Note Patient: Benjamin Fuller QVZ:[id number] DOB: May 27, 1941 DOA: 10/02/2023  DOS: the patient was seen and examined on 10/05/2023  Brief hospital course: PMH of PAF, type II DM, HLD, CHB SP ICD implant presented to hospital with complaints of fever and chills as well as shortness of breath and cough. Currently maintained for community-acquired pneumonia.  Assessment and Plan: Sepsis secondary to community-acquired pneumonia. Present on admission. Meeting SIRS criteria on admission with fever tachycardia and tachypnea. Blood cultures done in the ED few days ago was negative.  Repeat blood culture this admission is also negative. COVID, flu, RVP negative. Treated with IV antibiotics ceftriaxone and azithromycin as well as IV fluid. procalcitonin is elevated. Blood culture negative for 3 days. Now switching to oral.  Also switch to oral prednisone.  Type 2 diabetes mellitus, uncontrolled with hyperglycemia without long-term insulin use. Without any complication. Monitor for now that he will be on steroids.  History of ICD implant. Blood cultures negative which is reassuring. Monitor for now.  Paroxysmal A-fib. On Eliquis. Monitor for now.  HLD. Continue statin.   Subjective: Improving respiratory distress.  No nausea, no fever no chills.  No chest pain.  Physical Exam: General: in Mild distress, No Rash Cardiovascular: S1 and S2 Present, No Murmur Respiratory: Good respiratory effort, Bilateral Air entry present.  Bilateral crackles, No wheezes Abdomen: Bowel Sound present, No tenderness Extremities: Trace edema Neuro: Alert and oriented x3, no new focal deficit  Data Reviewed: I have Reviewed nursing notes, Vitals, and Lab results. Since last encounter, pertinent lab results CBC and BMP   . I have ordered test including CBC and BMP and procalcitonin  .   Disposition: Status is: Inpatient Remains inpatient appropriate because: Continuing close  observation for improvement in oxygenation. apixaban (ELIQUIS) tablet 5 mg   Family Communication: Family at bedside Level of care: Telemetry   Vitals:   10/04/23 2040 10/04/23 2342 10/05/23 0618 10/05/23 1329  BP:  (!) 145/76 (!) 144/85 (!) 147/75  Pulse:  70 70 70  Resp:  20 20 19   Temp:  97.8 F (36.6 C) 97.9 F (36.6 C) 97.7 F (36.5 C)  TempSrc:  Oral Oral Oral  SpO2: 98% 99% 99% 100%  Weight:      Height:         Author: Lynden Oxford, MD 10/05/2023 6:15 PM  Please look on [email or web address] to find out who is on call.

## 2023-10-06 DIAGNOSIS — A419 Sepsis, unspecified organism: Secondary | ICD-10-CM | POA: Diagnosis not present

## 2023-10-06 LAB — BASIC METABOLIC PANEL
Anion gap: 12 (ref 5–15)
BUN: 28 mg/dL — ABNORMAL HIGH (ref 8–23)
CO2: 25 mmol/L (ref 22–32)
Calcium: 8.9 mg/dL (ref 8.9–10.3)
Chloride: 102 mmol/L (ref 98–111)
Creatinine, Ser: 0.59 mg/dL — ABNORMAL LOW (ref 0.61–1.24)
GFR, Estimated: >60 mL/min (ref >60)
Glucose, Bld: 254 mg/dL — ABNORMAL HIGH (ref 70–99)
Potassium: 4 mmol/L (ref 3.5–5.1)
Sodium: 139 mmol/L (ref 135–145)

## 2023-10-06 LAB — CULTURE, BLOOD (ROUTINE X 2)
Culture: NO GROWTH
Culture: NO GROWTH
Special Requests: ADEQUATE
Special Requests: ADEQUATE

## 2023-10-06 LAB — PROCALCITONIN: Procalcitonin: 0.39 ng/mL

## 2023-10-06 LAB — GLUCOSE, CAPILLARY
Glucose-Capillary: 264 mg/dL — ABNORMAL HIGH (ref 70–99)
Glucose-Capillary: 271 mg/dL — ABNORMAL HIGH (ref 70–99)
Glucose-Capillary: 308 mg/dL — ABNORMAL HIGH (ref 70–99)

## 2023-10-06 LAB — CBC
HCT: 37.2 % — ABNORMAL LOW (ref 39.0–52.0)
Hemoglobin: 12.3 g/dL — ABNORMAL LOW (ref 13.0–17.0)
MCH: 31.1 pg (ref 26.0–34.0)
MCHC: 33.1 g/dL (ref 30.0–36.0)
MCV: 94.2 fL (ref 80.0–100.0)
Platelets: 235 10*3/uL (ref 150–400)
RBC: 3.95 MIL/uL — ABNORMAL LOW (ref 4.22–5.81)
RDW: 13.8 % (ref 11.5–15.5)
WBC: 8.2 10*3/uL (ref 4.0–10.5)
nRBC: 0 % (ref 0.0–0.2)

## 2023-10-06 LAB — MAGNESIUM: Magnesium: 2.2 mg/dL (ref 1.7–2.4)

## 2023-10-06 MED ORDER — INSULIN ASPART 100 UNIT/ML IJ SOLN
0.0000 [IU] | Freq: Every day | INTRAMUSCULAR | Status: DC
  Administered 2023-10-06: 4 [IU] via SUBCUTANEOUS

## 2023-10-06 MED ORDER — PREDNISONE 5 MG PO TABS
30.0000 mg | ORAL_TABLET | Freq: Every day | ORAL | Status: DC
  Administered 2023-10-07: 30 mg via ORAL
  Filled 2023-10-06: qty 2

## 2023-10-06 MED ORDER — INSULIN ASPART 100 UNIT/ML IJ SOLN
0.0000 [IU] | Freq: Three times a day (TID) | INTRAMUSCULAR | Status: DC
  Administered 2023-10-06 – 2023-10-07 (×3): 8 [IU] via SUBCUTANEOUS

## 2023-10-06 MED ORDER — IPRATROPIUM-ALBUTEROL 0.5-2.5 (3) MG/3ML IN SOLN
3.0000 mL | Freq: Two times a day (BID) | RESPIRATORY_TRACT | Status: DC
  Administered 2023-10-06 – 2023-10-07 (×2): 3 mL via RESPIRATORY_TRACT
  Filled 2023-10-06 (×2): qty 3

## 2023-10-06 NOTE — TOC Initial Note (Signed)
Transition of Care [facility] - Initial/Assessment Note    Patient Details  Name: Benjamin Fuller MRN: [id number] Date of Birth: 1941-09-13  Transition of Care [facility] CM/SW Contact:    Darleene Cleaver, LCSW Phone Number: 10/06/2023, 6:15 PM  Clinical Narrative:                  Patient had some confusion, and wife was not available.  CSW completed assessment by reviewing patient's chart.  CSW reviewed patient's chart, patient is an 82 year old male who is married and lives with his wife.  Patient is not currently receiving any home health services based on chart review.  Patient was going to outpatient therapy.  PT worked with patient is is recommending home health PT, however patient is still going to outpatient PT.  CSW will follow up with patient on Monday to determine if he wants [facility] or continue with outpatient therapy.    Physician suggested patient may need oxygen, CSW checked with bedside nurse to see if he qualifies, per bedside nurse patient is staying around 92-95% oxygen, patient should be able to return home without oxygen at this time.  TOC to follow up on Monday regarding discharge planning for patient.    Expected Discharge Plan: Home w Home Health Services Barriers to Discharge: Continued Medical Work up   Patient Goals and CMS Choice Patient states their goals for this hospitalization and ongoing recovery are:: To return back home. CMS Medicare.gov Compare Post Acute Care list provided to:: Other (Comment Required) (Patient has some confusion, and wife not availalble.  Reviewed chart.)        Expected Discharge Plan and Services     Post Acute Care Choice: Home Health                                        Prior Living Arrangements/Services   Lives with:: Spouse Patient language and need for interpreter reviewed:: Yes Do you feel safe going back to the place where you live?: Yes      Need for Family Participation in Patient Care: Yes (Comment) Care giver  support system in place?: No (comment)   Criminal Activity/Legal Involvement Pertinent to Current Situation/Hospitalization: No - Comment as needed  Activities of Daily Living   ADL Screening (condition at time of admission) Independently performs ADLs?: No Does the patient have a NEW difficulty with bathing/dressing/toileting/self-feeding that is expected to last >3 days?: No Does the patient have a NEW difficulty with getting in/out of bed, walking, or climbing stairs that is expected to last >3 days?: Yes (Initiates electronic notice to provider for possible PT consult) Does the patient have a NEW difficulty with communication that is expected to last >3 days?: No Is the patient deaf or have difficulty hearing?: Yes Does the patient have difficulty seeing, even when wearing glasses/contacts?: Yes Does the patient have difficulty concentrating, remembering, or making decisions?: Yes  Permission Sought/Granted Permission sought to share information with : Case Manager, Family Supports Permission granted to share information with : Yes, Release of Information Signed  Share Information with NAME: Eluterio, Nicolaou [phone]  [phone]  Permission granted to share info w AGENCY: HH agencies        Emotional Assessment Appearance:: Appears stated age   Affect (typically observed): Calm, Accepting, Stable Orientation: : Oriented to Self, Oriented to Place, Oriented to  Time, Oriented to Situation Alcohol /  Substance Use: Not Applicable Psych Involvement: No (comment)  Admission diagnosis:  Sepsis (HCC) [A41.9] Pneumonia of left lower lobe due to infectious organism [J18.9] Sepsis without acute organ dysfunction, due to unspecified organism [facility]) [A41.9] Patient Active Problem List   Diagnosis Date Noted   Sepsis (HCC) 10/02/2023   CAP (community acquired pneumonia) 10/02/2023   Uncontrolled type 2 diabetes mellitus with hypoglycemia, without long-term current use of insulin  (HCC) 10/02/2023   Hyponatremia 10/02/2023   Hypokalemia 10/02/2023   Pacemaker 03/27/2022   Heart block AV complete (HCC) 12/20/2021   Secondary hypercoagulable state (HCC) 04/27/2021   Type 2 diabetes mellitus without complication, without long-term current use of insulin (HCC) 06/01/2020   Hyperlipidemia 06/01/2020   Paroxysmal atrial fibrillation (HCC) 06/01/2020   Fatigue 06/01/2020   Persistent atrial fibrillation (HCC) 01/28/2020   Current use of long term anticoagulation 01/28/2020   History of epistaxis 01/28/2020   Essential hypertension 01/28/2020   ANEMIA, IRON DEFICIENCY 10/18/2008   PERSONAL HX BREAST CANCER 10/18/2008   History of colonic polyps 10/14/2008   PCP:  Soundra Pilon, FNP Pharmacy:   [id number] [place], [place] - [address] AT [facility] OF [facility] & [address] [place] [address] Phone: [phone] Fax: [phone]     Social Determinants of Health (SDOH) Social History: SDOH Screenings   Food Insecurity: No Food Insecurity (10/03/2023)  Housing: Low Risk  (10/03/2023)  Transportation Needs: No Transportation Needs (10/03/2023)  Utilities: Not At Risk (10/03/2023)  Tobacco Use: Medium Risk (10/02/2023)   SDOH Interventions:     Readmission Risk Interventions     No data to display

## 2023-10-06 NOTE — Progress Notes (Signed)
Mobility Specialist - Progress Note   10/06/23 1440  Oxygen Therapy  SpO2 92 %  O2 Device Room Air  Patient Activity (if Appropriate) Ambulating  Mobility  Activity Ambulated with assistance in hallway  Level of Assistance Standby assist, set-up cues, supervision of patient - no hands on  Assistive Device Front wheel walker  Distance Ambulated (ft) 480 ft  Activity Response Tolerated well  Mobility Referral Yes  $Mobility charge 1 Mobility  Mobility Specialist Start Time (ACUTE ONLY) 0228  Mobility Specialist Stop Time (ACUTE ONLY) 0239  Mobility Specialist Time Calculation (min) (ACUTE ONLY) 11 min   Pt received in recliner and agreeable to mobility. No complaints during session. Pt to recliner after session with all needs met.    During mobility: 92% SpO2 (RA) Post-mobility: 95% SPO2 (RA)  Chief Technology Officer

## 2023-10-06 NOTE — Progress Notes (Signed)
[facility] Progress Note Patient: ABRHAM MASLOWSKI ZOX:[id number] DOB: 20-Jan-1941 DOA: 10/02/2023  DOS: the patient was seen and examined on 10/06/2023  Brief hospital course: PMH of PAF, type II DM, HLD, CHB SP ICD implant presented to hospital with complaints of fever and chills as well as shortness of breath and cough. Currently maintained for community-acquired pneumonia.  Assessment and Plan: Sepsis secondary to community-acquired pneumonia. Present on admission. Meeting SIRS criteria on admission with fever tachycardia and tachypnea. Blood cultures done in the ED few days ago was negative.  Repeat blood culture this admission is also negative. COVID, flu, RVP negative. Treated with IV antibiotics ceftriaxone and azithromycin as well as IV fluid. procalcitonin is elevated, getting better. Blood culture negative. Now switching to oral. Also switch to oral prednisone. Continue Antibiotics for total 7 days. Taper prednisone rapidly.  Type 2 diabetes mellitus, uncontrolled with hyperglycemia without long-term insulin use. Without any complication. Monitor for now that he will be on steroids.  History of ICD implant. Blood cultures negative which is reassuring. Monitor for now.  Paroxysmal A-fib. On Eliquis. Monitor for now.  HLD. Continue statin.   Subjective: no acute complains, ongoing fatigue and weakness.   Physical Exam: General: in Mild distress, No Rash Cardiovascular: S1 and S2 Present, No Murmur Respiratory: Good respiratory effort, Bilateral Air entry present. Bilateral  Crackles, No wheezes Abdomen: Bowel Sound present, No tenderness Extremities: left leg edema Neuro: Alert and oriented x3, no new focal deficit  Data Reviewed: I have Reviewed nursing notes, Vitals, and Lab results. Since last encounter, pertinent lab results CBC CMP   . I have ordered test including cbc BMP  .   Disposition: Status is: Inpatient Remains inpatient appropriate because:  awaiting PT eval.   apixaban (ELIQUIS) tablet 5 mg   Family Communication: wife at bedside Level of care: Telemetry   Vitals:   10/06/23 0531 10/06/23 0829 10/06/23 1309 10/06/23 1440  BP: (!) 146/74  (!) 148/84   Pulse: 70  70   Resp: 18  18   Temp: 97.6 F (36.4 C)  97.7 F (36.5 C)   TempSrc: Oral  Oral   SpO2: 96% 94% 97% 92%  Weight:      Height:         Author: Lynden Oxford, MD 10/06/2023 6:52 PM  Please look on [email or web address] to find out who is on call.

## 2023-10-07 ENCOUNTER — Ambulatory Visit: Payer: Medicare PPO

## 2023-10-07 DIAGNOSIS — A419 Sepsis, unspecified organism: Secondary | ICD-10-CM | POA: Diagnosis not present

## 2023-10-07 LAB — HEMOGLOBIN A1C
Hgb A1c MFr Bld: 7.1 % — ABNORMAL HIGH (ref 4.8–5.6)
Mean Plasma Glucose: 157.07 mg/dL

## 2023-10-07 LAB — BASIC METABOLIC PANEL WITH GFR
Anion gap: 13 (ref 5–15)
BUN: 26 mg/dL — ABNORMAL HIGH (ref 8–23)
CO2: 23 mmol/L (ref 22–32)
Calcium: 9 mg/dL (ref 8.9–10.3)
Chloride: 104 mmol/L (ref 98–111)
Creatinine, Ser: 0.74 mg/dL (ref 0.61–1.24)
GFR, Estimated: >60 mL/min (ref >60)
Glucose, Bld: 201 mg/dL — ABNORMAL HIGH (ref 70–99)
Potassium: 3.5 mmol/L (ref 3.5–5.1)
Sodium: 140 mmol/L (ref 135–145)

## 2023-10-07 LAB — CBC
HCT: 37.8 % — ABNORMAL LOW (ref 39.0–52.0)
Hemoglobin: 12.4 g/dL — ABNORMAL LOW (ref 13.0–17.0)
MCH: 30.8 pg (ref 26.0–34.0)
MCHC: 32.8 g/dL (ref 30.0–36.0)
MCV: 94 fL (ref 80.0–100.0)
Platelets: 317 10*3/uL (ref 150–400)
RBC: 4.02 MIL/uL — ABNORMAL LOW (ref 4.22–5.81)
RDW: 13.7 % (ref 11.5–15.5)
WBC: 9.7 10*3/uL (ref 4.0–10.5)
nRBC: 0 % (ref 0.0–0.2)

## 2023-10-07 LAB — GLUCOSE, CAPILLARY
Glucose-Capillary: 164 mg/dL — ABNORMAL HIGH (ref 70–99)
Glucose-Capillary: 287 mg/dL — ABNORMAL HIGH (ref 70–99)

## 2023-10-07 LAB — CULTURE, BLOOD (ROUTINE X 2)
Culture: NO GROWTH
Culture: NO GROWTH

## 2023-10-07 LAB — MAGNESIUM: Magnesium: 2 mg/dL (ref 1.7–2.4)

## 2023-10-07 MED ORDER — BENZONATATE 100 MG PO CAPS: 100.0000 mg | ORAL_CAPSULE | Freq: Three times a day (TID) | ORAL | 0 refills | Status: DC

## 2023-10-07 MED ORDER — GUAIFENESIN ER 600 MG PO TB12: 600.0000 mg | ORAL_TABLET | Freq: Two times a day (BID) | ORAL | 2 refills | Status: DC

## 2023-10-07 MED ORDER — AZITHROMYCIN 500 MG PO TABS: 500.0000 mg | ORAL_TABLET | Freq: Every day | ORAL | 0 refills | Status: AC

## 2023-10-07 MED ORDER — PREDNISONE 10 MG PO TABS: ORAL_TABLET | ORAL | 0 refills | Status: DC

## 2023-10-07 MED ORDER — CEFDINIR 300 MG PO CAPS: 300.0000 mg | ORAL_CAPSULE | Freq: Two times a day (BID) | ORAL | 0 refills | Status: AC

## 2023-10-07 MED ORDER — ALBUTEROL SULFATE HFA 108 (90 BASE) MCG/ACT IN AERS: 1.0000 | INHALATION_SPRAY | Freq: Four times a day (QID) | RESPIRATORY_TRACT | 0 refills | Status: DC | PRN

## 2023-10-07 NOTE — TOC Transition Note (Addendum)
Transition of Care [facility]) - CM/SW Discharge Note   Patient Details  Name: Benjamin Fuller MRN: [id number] Date of Birth: 08-22-1941  Transition of Care [facility]) CM/SW Contact:  Darleene Cleaver, LCSW Phone Number: 10/07/2023, 12:21 PM   Clinical Narrative:     CSW spoke to patient's wife [name], [phone] to discuss home health services and any equipment needs.  Per patient's wife, he is not currently receiving any HH.  CSW asked if she had a preference, per patient's wife they did not.  CSW informed her that [place], [place], [place], and [place] all take his insurance.  CSW was able to arrange [facility] services through [facility].  Per Tresa Endo at [place], they should be able to see patient within 24-48 hours.  Patient's wife also requested a rolling walker, CSW contacted [employer] to arrange for a rolling walker to be delivered to patient's room prior to him discharging.  CSW spoke to [place] at [phone], he will deliver rolling walker to room prior to patient discharging.  4:00pm CSW spoke to [place] from [employer], he said they were out of rolling walkers on site at [place] and the delivery person had to go back to [facility] to pick some up.  Zack offered to have the walker drop shipped to the patient's house.  CSW spoke to patient's wife, they would rather just have the rolling walker delivered to the house.  CSW updated Zack and bedside nurse.  TOC signing off, patient's wife did not have any other questions or concerns.  Final next level of care: Home w Home Health Services Barriers to Discharge: Barriers Resolved   Patient Goals and CMS Choice CMS Medicare.gov Compare Post Acute Care list provided to:: Patient Represenative (must comment) Choice offered to / list presented to : Spouse  Discharge Placement  Patient will be discharging back home with his wife.                 Patient and family notified of of transfer: 10/07/23  Discharge Plan and Services Additional resources  added to the After Visit Summary for       Post Acute Care Choice: Home Health          DME Arranged: Dan Humphreys rolling DME Agency: [employer] Date DME Agency Contacted: 10/07/23 Time DME Agency Contacted: 1220 Representative spoke with at DME Agency: Left voice mail with [place]. HH Arranged: OT, PT HH Agency: [facility] Date HH Agency Contacted: 10/07/23 Time HH Agency Contacted: 1040 Representative spoke with at [facility] Agency: Tresa Endo  Social Determinants of Health (SDOH) Interventions SDOH Screenings   Food Insecurity: No Food Insecurity (10/03/2023)  Housing: Low Risk  (10/03/2023)  Transportation Needs: No Transportation Needs (10/03/2023)  Utilities: Not At Risk (10/03/2023)  Tobacco Use: Medium Risk (10/02/2023)     Readmission Risk Interventions     No data to display

## 2023-10-07 NOTE — Clinical Social Work Note (Deleted)
    Durable Medical Equipment  (From admission, onward)           Start     Ordered   10/07/23 1042  For home use only DME Walker rolling  Once       Question Answer Comment  Walker: With 5 Inch Wheels   Patient needs a walker to treat with the following condition Physical deconditioning      10/07/23 1041

## 2023-10-07 NOTE — Care Management Important Message (Signed)
Important Message  Patient Details IM Letter given. Name: Benjamin Fuller MRN: [id number] Date of Birth: [birth year]   Important Message Given:  Yes - Medicare IM     Caren Macadam 10/07/2023, 2:14 PM

## 2023-10-07 NOTE — Progress Notes (Signed)
Physical Therapy Treatment Patient Details Name: Benjamin Fuller MRN: [id number] DOB: 1941/10/15 Today's Date: 10/07/2023   History of Present Illness 82 yo male presents to therapy following hospital admission on 10/02/2023 due to sepsis secondary to PNA and fall 10/1. Pt had presented to ED 09/30/2023 with similar c/o fatigue, congestion, fever and nausea and symptoms have not resolved. Pt has PMH including but not limited to: DM II, arthritis, anemia, A-fib, HTN, HLD, LBBB, and pacemaker placement.    PT Comments  Pt tolerated session well. Lots of education for DC home question. Performed ambulation and stairs today with wife present. Tolerated stairs using 1 rail and both hands, step to pattern. Performed 2x. Educated able how to  assist pt up from floor if he was to fall, issued a gait belt, reviewed some exercises he was doing at OP, and reminded of safety at home with urinal at night if need for energy conservation and safety. All set for DC from PT standpoint. Will sign off and if remains , mobility specialist will continue to follow.    If plan is discharge home, recommend the following: A little help with walking and/or transfers;A little help with bathing/dressing/bathroom;Help with stairs or ramp for entrance   Can travel by private vehicle        Equipment Recommendations  Rolling walker (2 wheels)    Recommendations for Other Services       Precautions / Restrictions Precautions Precautions: Fall Restrictions Weight Bearing Restrictions: No     Mobility  Bed Mobility                    Transfers Overall transfer level: Modified independent Equipment used: Rolling walker (2 wheels)                    Ambulation/Gait Ambulation/Gait assistance: Supervision Gait Distance (Feet): 200 Feet Assistive device: Rolling walker (2 wheels) Gait Pattern/deviations: Step-through pattern       General Gait Details: walked with good pace with RW , cued on  upright posture with RW as well. Tolearted well. Also watched pt walk with no AD in room, tolerated well, but at this time use RW for next few days until gets his strength back.   Stairs Stairs: Yes Stairs assistance: Contact guard assist Stair Management: One rail Right, Step to pattern Number of Stairs: 6 General stair comments: turned towards 1 rail to place both hands on the rail to assist pt. Educated about having chair once he gets into the home to have a rest break. and to take 1 step at a time .   Wheelchair Mobility     Tilt Bed    Modified Rankin (Stroke Patients Only)       Balance                                            Cognition Arousal: Alert Behavior During Therapy: WFL for tasks assessed/performed Overall Cognitive Status: Within Functional Limits for tasks assessed                                          Exercises      General Comments        Pertinent Vitals/Pain Pain Assessment Pain Assessment: No/denies pain  Home Living                          Prior Function            PT Goals (current goals can now be found in the care plan section) Acute Rehab PT Goals Patient Stated Goal: to be able to to breath better, go home and return to [employer] OPPT Time For Goal Achievement: 10/18/23 Potential to Achieve Goals: Good Progress towards PT goals: Progressing toward goals    Frequency    Min 1X/week      PT Plan      Co-evaluation              AM-PAC PT "6 Clicks" Mobility   Outcome Measure  Help needed turning from your back to your side while in a flat bed without using bedrails?: None Help needed moving from lying on your back to sitting on the side of a flat bed without using bedrails?: A Little Help needed moving to and from a bed to a chair (including a wheelchair)?: A Little Help needed standing up from a chair using your arms (e.g., wheelchair or bedside chair)?: A  Little Help needed to walk in hospital room?: A Little Help needed climbing 3-5 steps with a railing? : A Little 6 Click Score: 19    End of Session Equipment Utilized During Treatment: Gait belt Activity Tolerance: Treatment limited secondary to medical complications (Comment) Patient left: with call bell/phone within reach;with family/visitor present;in chair Nurse Communication: Mobility status PT Visit Diagnosis: Unsteadiness on feet (R26.81);Other abnormalities of gait and mobility (R26.89)     Time: 1500-1530 PT Time Calculation (min) (ACUTE ONLY): 30 min  Charges:    $Gait Training: 23-37 mins PT General Charges $$ ACUTE PT VISIT: 1 Visit                     Makilah Dowda, PT, MPT Acute Rehabilitation Services Office: [phone] If a weekend: secure chat groups: [facility] PT, [facility] OT, [facility] SLP 10/07/2023    Avary Pitsenbarger 10/07/2023, 4:38 PM

## 2023-10-07 NOTE — Progress Notes (Signed)
Mobility Specialist - Progress Note   10/07/23 1350  Mobility  Activity Ambulated with assistance to bathroom  Level of Assistance Standby assist, set-up cues, supervision of patient - no hands on  Assistive Device Front wheel walker  Distance Ambulated (ft) 20 ft  Activity Response Tolerated well  Mobility Referral Yes  $Mobility charge 1 Mobility  Mobility Specialist Stop Time (ACUTE ONLY) 0149   Pt received in recliner with chair alarm going off requesting assistance to the bathroom. No complaints during session. Pt to bathroom after session with all needs met. Wife voiced that she will help get pt back to recliner when pt is finished   Chief Technology Officer

## 2023-10-07 NOTE — TOC CM/SW Note (Signed)
    Durable Medical Equipment  (From admission, onward)           Start     Ordered   10/07/23 1042  For home use only DME Walker rolling  Once       Question Answer Comment  Walker: With 5 Inch Wheels   Patient needs a walker to treat with the following condition Physical deconditioning      10/07/23 1041

## 2023-10-09 DIAGNOSIS — N401 Enlarged prostate with lower urinary tract symptoms: Secondary | ICD-10-CM | POA: Diagnosis not present

## 2023-10-09 DIAGNOSIS — N39498 Other specified urinary incontinence: Secondary | ICD-10-CM | POA: Diagnosis not present

## 2023-10-09 DIAGNOSIS — E11649 Type 2 diabetes mellitus with hypoglycemia without coma: Secondary | ICD-10-CM | POA: Diagnosis not present

## 2023-10-09 DIAGNOSIS — I4819 Other persistent atrial fibrillation: Secondary | ICD-10-CM | POA: Diagnosis not present

## 2023-10-09 DIAGNOSIS — J181 Lobar pneumonia, unspecified organism: Secondary | ICD-10-CM | POA: Diagnosis not present

## 2023-10-09 DIAGNOSIS — A419 Sepsis, unspecified organism: Secondary | ICD-10-CM | POA: Diagnosis not present

## 2023-10-09 DIAGNOSIS — I700 Atherosclerosis of aorta: Secondary | ICD-10-CM | POA: Diagnosis not present

## 2023-10-09 DIAGNOSIS — I442 Atrioventricular block, complete: Secondary | ICD-10-CM | POA: Diagnosis not present

## 2023-10-09 DIAGNOSIS — I2510 Atherosclerotic heart disease of native coronary artery without angina pectoris: Secondary | ICD-10-CM | POA: Diagnosis not present

## 2023-10-09 NOTE — Discharge Summary (Signed)
Physician Discharge Summary   Patient: Benjamin Fuller MRN: [id number] DOB: [birth year]  Admit date:     10/02/2023  Discharge date: 10/07/2023  Discharge Physician: Lynden Oxford  PCP: Soundra Pilon, FNP  Recommendations at discharge:  Follow up with PCP in 1 week   Follow-up Information     Soundra Pilon, FNP. Schedule an appointment as soon as possible for a visit in 1 week(s).   Specialty: Family Medicine Why: Repeat Chest X ray Contact information: [address] [place] [address] [phone]         [facility] Follow up.   Specialty: Home Health Services Why: [facility] will contact you to set up the first home health physical therapy and occupational therapy appointment. Contact information: [address] [place] [address] [phone]         [facility] Follow up.   Why: [facility] will provide rolling walker for patient. Contact information: [address] [place] [address] [phone]                Discharge Diagnoses: Principal Problem:   Sepsis [facility]) Active Problems:   Hyperlipidemia   Paroxysmal atrial fibrillation (HCC)   Pacemaker   CAP (community acquired pneumonia)   Uncontrolled type 2 diabetes mellitus with hypoglycemia, without long-term current use of insulin (HCC)   Hyponatremia   Hypokalemia  Hospital Course: Brief hospital course: PMH of PAF, type II DM, HLD, CHB SP ICD implant presented to hospital with complaints of fever and chills as well as shortness of breath and cough. Currently maintained for community-acquired pneumonia.  Assessment and Plan: Sepsis secondary to community-acquired pneumonia. Present on admission. Meeting SIRS criteria on admission with fever tachycardia and tachypnea. Blood cultures done in the ED few days ago was negative.  Repeat blood culture this admission is also negative. COVID, flu, RVP negative. Treated with IV  antibiotics ceftriaxone and azithromycin as well as IV fluid. procalcitonin is elevated, getting better. Blood culture negative. Now switching to oral. Also switch to oral prednisone. Continue Antibiotics for total 7 days. Taper prednisone rapidly.  Type 2 diabetes mellitus, uncontrolled with hyperglycemia without long-term insulin use. Without any complication. Monitor for now that he will be on steroids.  History of ICD implant. Blood cultures negative which is reassuring. Monitor for now.  Paroxysmal A-fib. On Eliquis. Monitor for now.  HLD. Continue statin.  Consultants:  none  Procedures performed:  none  DISCHARGE MEDICATION: Allergies as of 10/07/2023       Reactions   Metformin Hcl Diarrhea, Other (See Comments)   In high doses = diarrhea   Penicillins Nausea Only, Other (See Comments)   Reaction: 30 years ago        Medication List     TAKE these medications    albuterol 108 (90 Base) MCG/ACT inhaler Commonly known as: VENTOLIN HFA Inhale 1 puff into the lungs every 6 (six) hours as needed for wheezing.   benzonatate 100 MG capsule Commonly known as: TESSALON Take 1 capsule (100 mg total) by mouth 3 (three) times daily.   cefdinir 300 MG capsule Commonly known as: OMNICEF Take 1 capsule (300 mg total) by mouth 2 (two) times daily for 2 days.   cyanocobalamin 1000 MCG tablet Commonly known as: VITAMIN B12 Take 1,000 mcg by mouth daily with lunch.   diphenhydramine-acetaminophen 25-500 MG Tabs tablet Commonly known as: TYLENOL PM Take 1 tablet by mouth at bedtime.   Eliquis  5 MG Tabs tablet Generic drug: apixaban TAKE 1 TABLET TWICE A DAY What changed:  how much to take when to take this   eucerin lotion Apply 1 Application topically as needed for dry skin (affected areas).   ferrous gluconate 324 MG tablet Commonly known as: FERGON Take 324 mg by mouth daily with breakfast.   furosemide 40 MG tablet Commonly known as: LASIX Take 1  tablet (40 mg total) by mouth daily as needed. For weight gain >5 lbs, swelling, shortness of breath   glimepiride 4 MG tablet Commonly known as: AMARYL Take 4 mg by mouth 2 (two) times daily.   guaiFENesin 600 MG 12 hr tablet Commonly known as: Mucinex Take 1 tablet (600 mg total) by mouth 2 (two) times daily.   Jardiance 25 MG Tabs tablet Generic drug: empagliflozin Take 25 mg by mouth daily.   metFORMIN 500 MG 24 hr tablet Commonly known as: GLUCOPHAGE-XR Take 500 mg by mouth daily with breakfast.   multivitamin with minerals Tabs tablet Take 1 tablet by mouth daily with breakfast.   ondansetron 4 MG disintegrating tablet Commonly known as: ZOFRAN-ODT Take 1 tablet (4 mg total) by mouth every 8 (eight) hours as needed for nausea or vomiting.   pioglitazone 15 MG tablet Commonly known as: ACTOS Take 15 mg by mouth daily after lunch.   predniSONE 10 MG tablet Commonly known as: DELTASONE Take 20mg  daily for 3days,Take 10mg  daily for 3days, then stop   ramipril 10 MG capsule Commonly known as: ALTACE Take 10 mg by mouth in the morning.   simvastatin 40 MG tablet Commonly known as: ZOCOR Take 40 mg by mouth at bedtime.   sodium chloride 0.65 % Soln nasal spray Commonly known as: OCEAN Place 1 spray into both nostrils at bedtime.   tamsulosin 0.4 MG Caps capsule Commonly known as: FLOMAX Take 0.4 mg by mouth in the morning.   Tylenol 8 Hour Arthritis Pain 650 MG CR tablet Generic drug: acetaminophen Take 650-1,300 mg by mouth every 8 (eight) hours as needed for fever.       ASK your doctor about these medications    azithromycin 500 MG tablet Commonly known as: ZITHROMAX Take 1 tablet (500 mg total) by mouth daily for 1 day. Ask about: Should I take this medication?       Disposition: Home Diet recommendation: Cardiac diet  Discharge Exam: Vitals:   10/06/23 2123 10/07/23 0544 10/07/23 0848 10/07/23 1633  BP: (!) 142/68 (!) 147/80    Pulse: 70 70   70  Resp: 18 18    Temp: 97.7 F (36.5 C) 97.8 F (36.6 C)    TempSrc: Oral Oral    SpO2: 97% 92% 96% 100%  Weight:      Height:       General: Appear in mild distress; no visible Abnormal Neck Mass Or lumps, Conjunctiva normal Cardiovascular: S1 and S2 Present, no Murmur, Respiratory: good respiratory effort, Bilateral Air entry present and basal Crackles, no wheezes Abdomen: Bowel Sound present, Non tender  Extremities: no Pedal edema Neurology: alert and oriented to time, place, and person  Filed Weights   10/02/23 1045  Weight: 76.2 kg   Condition at discharge: stable  The results of significant diagnostics from this hospitalization (including imaging, microbiology, ancillary and laboratory) are listed below for reference.   Imaging Studies: CT CHEST ABDOMEN PELVIS W CONTRAST  Result Date: 10/02/2023 CLINICAL DATA:  Fatigue, congestion, fever, nausea, general weakness, cough EXAM: CT CHEST, ABDOMEN, AND  PELVIS WITH CONTRAST TECHNIQUE: Multidetector CT imaging of the chest, abdomen and pelvis was performed following the standard protocol during bolus administration of intravenous contrast. RADIATION DOSE REDUCTION: This exam was performed according to the departmental dose-optimization program which includes automated exposure control, adjustment of the mA and/or kV according to patient size and/or use of iterative reconstruction technique. CONTRAST:  OMNIPAQUE IOHEXOL 300 MG/ML  SOLN COMPARISON:  Chest radiograph 10/02/2023 and CT abdomen and pelvis 08/17/2015 FINDINGS: CT CHEST FINDINGS Cardiovascular: No pericardial effusion. Coronary artery and aortic atherosclerotic calcification. Left chest wall ICD. Mediastinum/Nodes: Trachea and esophagus are unremarkable. Mediastinal and hilar lymphadenopathy. For example 1.3 cm left paratracheal node on series 2/image 23 and subcarinal node measuring 1.5 cm on series 2/image 32. Lungs/Pleura: Left lower lobe consolidation, ground-glass  opacities, and interlobular septal thickening. Trace left pleural effusion. The right lung is clear. Musculoskeletal: No acute fracture. CT ABDOMEN PELVIS FINDINGS Hepatobiliary: Cholelithiasis without evidence of cholecystitis. No biliary dilation. Hepatic steatosis. Pancreas: Unremarkable. Spleen: Unremarkable. Adrenals/Urinary Tract: Stable adrenal glands. Low-attenuation lesions in the kidneys are statistically likely to represent cysts. No follow-up is required. Chronic scarring/infarct in the posterior right kidney. Unremarkable bladder. Stomach/Bowel: Normal caliber large and small bowel. Colonic diverticulosis without diverticulitis. The sigmoid colon herniates into the left inguinal hernia, unchanged from 2016. No evidence of obstruction or strangulation. Stomach is within normal limits. The appendix is not visualized. Vascular/Lymphatic: Aortic atherosclerosis. No enlarged abdominal or pelvic lymph nodes. Reproductive: Enlarged prostate. Other: No free intraperitoneal fluid or air. Musculoskeletal: Postoperative changes left femur. No acute fracture. IMPRESSION: 1. Left lower lobe pneumonia. Follow-up in 6-8 weeks after treatment is recommended to ensure resolution. 2. Mediastinal and hilar lymphadenopathy, likely reactive. Continued attention on follow-up. 3. No acute abnormality in the abdomen or pelvis. 4. Hepatic steatosis. 5. Unchanged herniation of the sigmoid colon into the left inguinal hernia. No evidence of obstruction or strangulation. Aortic Atherosclerosis (ICD10-I70.0). Electronically Signed   By: Minerva Fester M.D.   On: 10/02/2023 18:54   DG Chest Portable 1 View  Result Date: 10/02/2023 CLINICAL DATA:  Fatigue.  Congestion.  Fever. EXAM: PORTABLE CHEST 1 VIEW COMPARISON:  Chest radiograph dated September 30, 2023. FINDINGS: The heart size and mediastinal contours are unchanged. Aortic atherosclerosis. Stable left internal jugular approach dual lead cardiac device. No focal  consolidation, pneumothorax, or sizable pleural effusion. The visualized skeletal structures are unchanged. IMPRESSION: No active cardiopulmonary disease. Electronically Signed   By: Hart Robinsons M.D.   On: 10/02/2023 14:53   CUP PACEART REMOTE DEVICE CHECK  Result Date: 10/02/2023 Scheduled remote reviewed. Normal device function.  1 NSVT, 12 beats in duration 4 brief ATR's, hx of PAF, Eliquis per PA report There has been an increase in RR and mean HR per trends since 9/28 Next remote 91 days. LA, CVRS  DG Chest Port 1 View  Result Date: 09/30/2023 CLINICAL DATA:  Fatigue with weakness, runny nose and fevers for 4 days. EXAM: PORTABLE CHEST 1 VIEW COMPARISON:  Radiographs 07/09/2022 and 12/21/2021. Cardiac CT 03/24/2021. FINDINGS: 1523 hours. Left subclavian pacemaker leads appear unchanged, projecting over the right atrium and right ventricle. The heart size and mediastinal contours are stable. There is mild aortic atherosclerosis. The lungs are clear. There is no pleural effusion or pneumothorax. No acute osseous findings are seen status post lower cervical fusion. Mild thoracic spondylosis. IMPRESSION: No evidence of acute cardiopulmonary process. Pacemaker leads appear unchanged in position. Electronically Signed   By: Carey Bullocks M.D.   On:  09/30/2023 18:16    Microbiology: Results for orders placed or performed during the hospital encounter of 10/02/23  Resp panel by RT-PCR (RSV, Flu A&B, Covid) Anterior Nasal Swab     Status: None   Collection Time: 10/02/23 10:52 AM   Specimen: Anterior Nasal Swab  Result Value Ref Range Status   SARS Coronavirus 2 by RT PCR NEGATIVE NEGATIVE Final    Comment: (NOTE) SARS-CoV-2 target nucleic acids are NOT DETECTED.  The SARS-CoV-2 RNA is generally detectable in upper respiratory specimens during the acute phase of infection. The lowest concentration of SARS-CoV-2 viral copies this assay can detect is 138 copies/mL. A negative result does not  preclude SARS-Cov-2 infection and should not be used as the sole basis for treatment or other patient management decisions. A negative result may occur with  improper specimen collection/handling, submission of specimen other than nasopharyngeal swab, presence of viral mutation(s) within the areas targeted by this assay, and inadequate number of viral copies(<138 copies/mL). A negative result must be combined with clinical observations, patient history, and epidemiological information. The expected result is Negative.  Fact Sheet for Patients:  BloggerCourse.com  Fact Sheet for Healthcare Providers:  SeriousBroker.it  This test is no t yet approved or cleared by the [place] FDA and  has been authorized for detection and/or diagnosis of SARS-CoV-2 by FDA under an Emergency Use Authorization (EUA). This EUA will remain  in effect (meaning this test can be used) for the duration of the COVID-19 declaration under Section 564(b)(1) of the Act, 21 U.S.C.section 360bbb-3(b)(1), unless the authorization is terminated  or revoked sooner.       Influenza A by PCR NEGATIVE NEGATIVE Final   Influenza B by PCR NEGATIVE NEGATIVE Final    Comment: (NOTE) The Xpert Xpress SARS-CoV-2/FLU/RSV plus assay is intended as an aid in the diagnosis of influenza from Nasopharyngeal swab specimens and should not be used as a sole basis for treatment. Nasal washings and aspirates are unacceptable for Xpert Xpress SARS-CoV-2/FLU/RSV testing.  Fact Sheet for Patients: BloggerCourse.com  Fact Sheet for Healthcare Providers: SeriousBroker.it  This test is not yet approved or cleared by the [place] FDA and has been authorized for detection and/or diagnosis of SARS-CoV-2 by FDA under an Emergency Use Authorization (EUA). This EUA will remain in effect (meaning this test can be used) for the  duration of the COVID-19 declaration under Section 564(b)(1) of the Act, 21 U.S.C. section 360bbb-3(b)(1), unless the authorization is terminated or revoked.     Resp Syncytial Virus by PCR NEGATIVE NEGATIVE Final    Comment: (NOTE) Fact Sheet for Patients: BloggerCourse.com  Fact Sheet for Healthcare Providers: SeriousBroker.it  This test is not yet approved or cleared by the [place] FDA and has been authorized for detection and/or diagnosis of SARS-CoV-2 by FDA under an Emergency Use Authorization (EUA). This EUA will remain in effect (meaning this test can be used) for the duration of the COVID-19 declaration under Section 564(b)(1) of the Act, 21 U.S.C. section 360bbb-3(b)(1), unless the authorization is terminated or revoked.  Performed at [facility], [address], [place], [place] [address]   Blood culture (routine x 2)     Status: None   Collection Time: 10/02/23  2:45 PM   Specimen: BLOOD  Result Value Ref Range Status   Specimen Description   Final    BLOOD BLOOD RIGHT FOREARM Performed at [facility], [address] [place], [place] [address]    Special Requests  Final    BOTTLES DRAWN AEROBIC ONLY Blood Culture results may not be optimal due to an inadequate volume of blood received in culture bottles Performed at [facility], [address] [place], [place] [address]    Culture   Final    NO GROWTH 5 DAYS Performed at [facility], [address] [place], [place] [address]    Report Status 10/07/2023 FINAL  Final  Blood culture (routine x 2)     Status: None   Collection Time: 10/02/23  2:50 PM   Specimen: BLOOD  Result Value Ref Range Status   Specimen Description   Final    BLOOD RIGHT ANTECUBITAL Performed at [facility], [address] [place], [place] [address]    Special Requests   Final    BOTTLES  DRAWN AEROBIC AND ANAEROBIC Blood Culture results may not be optimal due to an inadequate volume of blood received in culture bottles Performed at [facility], [address] [place], [place] [address]    Culture   Final    NO GROWTH 5 DAYS Performed at [facility], [address], [place], [place] [address]    Report Status 10/07/2023 FINAL  Final  Respiratory (~20 pathogens) panel by PCR     Status: None   Collection Time: 10/03/23  1:33 PM   Specimen: Nasopharyngeal Swab; Respiratory  Result Value Ref Range Status   Adenovirus NOT DETECTED NOT DETECTED Final   Coronavirus 229E NOT DETECTED NOT DETECTED Final    Comment: (NOTE) The Coronavirus on the Respiratory Panel, DOES NOT test for the novel  Coronavirus (2019 nCoV)    Coronavirus HKU1 NOT DETECTED NOT DETECTED Final   Coronavirus NL63 NOT DETECTED NOT DETECTED Final   Coronavirus OC43 NOT DETECTED NOT DETECTED Final   Metapneumovirus NOT DETECTED NOT DETECTED Final   Rhinovirus / Enterovirus NOT DETECTED NOT DETECTED Final   Influenza A NOT DETECTED NOT DETECTED Final   Influenza B NOT DETECTED NOT DETECTED Final   Parainfluenza Virus 1 NOT DETECTED NOT DETECTED Final   Parainfluenza Virus 2 NOT DETECTED NOT DETECTED Final   Parainfluenza Virus 3 NOT DETECTED NOT DETECTED Final   Parainfluenza Virus 4 NOT DETECTED NOT DETECTED Final   Respiratory Syncytial Virus NOT DETECTED NOT DETECTED Final   Bordetella pertussis NOT DETECTED NOT DETECTED Final   Bordetella Parapertussis NOT DETECTED NOT DETECTED Final   Chlamydophila pneumoniae NOT DETECTED NOT DETECTED Final   Mycoplasma pneumoniae NOT DETECTED NOT DETECTED Final    Comment: Performed at [facility], [address] [place], [place] [address]   Labs: CBC: Recent Labs  Lab 10/02/23 1052 10/03/23 0705 10/04/23 0846 10/06/23 0626 10/07/23 0529  WBC 14.1* 12.2* 9.9 8.2 9.7  NEUTROABS  --   --  8.1*  --   --   HGB 13.5 13.1  12.3* 12.3* 12.4*  HCT 40.1 39.1 36.1* 37.2* 37.8*  MCV 90.7 92.4 91.2 94.2 94.0  PLT 169 165 163 235 317   Basic Metabolic Panel: Recent Labs  Lab 10/02/23 2050 10/03/23 0705 10/03/23 1317 10/04/23 0846 10/05/23 0918 10/06/23 0626 10/07/23 0529  NA  --  132*  --  131* 132* 139 140  K  --  3.5  --  3.2* 3.7 4.0 3.5  CL  --  100  --  100 100 102 104  CO2  --  19*  --  21* 19* 25 23  GLUCOSE  --  98  --  122* 255* 254* 201*  BUN  --  17  --  21 20 28* 26*  CREATININE  --  0.63  --  0.61 0.77 0.59* 0.74  CALCIUM  --  8.2*  --  8.0* 8.5* 8.9 9.0  MG  --   --   --  2.2 2.0 2.2 2.0  PHOS 1.3*  --  1.8* 2.2*  --   --   --    Liver Function Tests: No results for input(s): "AST", "ALT", "ALKPHOS", "BILITOT", "PROT", "ALBUMIN" in the last 168 hours. CBG: Recent Labs  Lab 10/06/23 1130 10/06/23 1624 10/06/23 2128 10/07/23 0735 10/07/23 1200  GLUCAP 264* 271* 308* 164* 287*    Discharge time spent: greater than 30 minutes.  Author: Lynden Oxford, MD  [facility] 10/07/2023

## 2023-10-11 DIAGNOSIS — I442 Atrioventricular block, complete: Secondary | ICD-10-CM | POA: Diagnosis not present

## 2023-10-11 DIAGNOSIS — I4819 Other persistent atrial fibrillation: Secondary | ICD-10-CM | POA: Diagnosis not present

## 2023-10-11 DIAGNOSIS — N39498 Other specified urinary incontinence: Secondary | ICD-10-CM | POA: Diagnosis not present

## 2023-10-11 DIAGNOSIS — N401 Enlarged prostate with lower urinary tract symptoms: Secondary | ICD-10-CM | POA: Diagnosis not present

## 2023-10-11 DIAGNOSIS — E11649 Type 2 diabetes mellitus with hypoglycemia without coma: Secondary | ICD-10-CM | POA: Diagnosis not present

## 2023-10-11 DIAGNOSIS — I700 Atherosclerosis of aorta: Secondary | ICD-10-CM | POA: Diagnosis not present

## 2023-10-11 DIAGNOSIS — J181 Lobar pneumonia, unspecified organism: Secondary | ICD-10-CM | POA: Diagnosis not present

## 2023-10-11 DIAGNOSIS — A419 Sepsis, unspecified organism: Secondary | ICD-10-CM | POA: Diagnosis not present

## 2023-10-11 DIAGNOSIS — I2510 Atherosclerotic heart disease of native coronary artery without angina pectoris: Secondary | ICD-10-CM | POA: Diagnosis not present

## 2023-10-17 DIAGNOSIS — E119 Type 2 diabetes mellitus without complications: Secondary | ICD-10-CM | POA: Diagnosis not present

## 2023-10-17 DIAGNOSIS — D6869 Other thrombophilia: Secondary | ICD-10-CM | POA: Diagnosis not present

## 2023-10-17 DIAGNOSIS — I77810 Thoracic aortic ectasia: Secondary | ICD-10-CM | POA: Diagnosis not present

## 2023-10-17 DIAGNOSIS — Z09 Encounter for follow-up examination after completed treatment for conditions other than malignant neoplasm: Secondary | ICD-10-CM | POA: Diagnosis not present

## 2023-10-17 DIAGNOSIS — I442 Atrioventricular block, complete: Secondary | ICD-10-CM | POA: Diagnosis not present

## 2023-10-17 DIAGNOSIS — K760 Fatty (change of) liver, not elsewhere classified: Secondary | ICD-10-CM | POA: Diagnosis not present

## 2023-10-17 DIAGNOSIS — E1169 Type 2 diabetes mellitus with other specified complication: Secondary | ICD-10-CM | POA: Diagnosis not present

## 2023-10-17 DIAGNOSIS — I480 Paroxysmal atrial fibrillation: Secondary | ICD-10-CM | POA: Diagnosis not present

## 2023-10-17 DIAGNOSIS — I700 Atherosclerosis of aorta: Secondary | ICD-10-CM | POA: Diagnosis not present

## 2023-10-17 DIAGNOSIS — J189 Pneumonia, unspecified organism: Secondary | ICD-10-CM | POA: Diagnosis not present

## 2023-10-18 DIAGNOSIS — J181 Lobar pneumonia, unspecified organism: Secondary | ICD-10-CM | POA: Diagnosis not present

## 2023-10-18 DIAGNOSIS — I700 Atherosclerosis of aorta: Secondary | ICD-10-CM | POA: Diagnosis not present

## 2023-10-18 DIAGNOSIS — N39498 Other specified urinary incontinence: Secondary | ICD-10-CM | POA: Diagnosis not present

## 2023-10-18 DIAGNOSIS — I4819 Other persistent atrial fibrillation: Secondary | ICD-10-CM | POA: Diagnosis not present

## 2023-10-18 DIAGNOSIS — I442 Atrioventricular block, complete: Secondary | ICD-10-CM | POA: Diagnosis not present

## 2023-10-18 DIAGNOSIS — N401 Enlarged prostate with lower urinary tract symptoms: Secondary | ICD-10-CM | POA: Diagnosis not present

## 2023-10-18 DIAGNOSIS — A419 Sepsis, unspecified organism: Secondary | ICD-10-CM | POA: Diagnosis not present

## 2023-10-18 DIAGNOSIS — E11649 Type 2 diabetes mellitus with hypoglycemia without coma: Secondary | ICD-10-CM | POA: Diagnosis not present

## 2023-10-18 DIAGNOSIS — I2510 Atherosclerotic heart disease of native coronary artery without angina pectoris: Secondary | ICD-10-CM | POA: Diagnosis not present

## 2023-10-18 NOTE — Progress Notes (Signed)
Remote pacemaker transmission.   

## 2023-10-19 DIAGNOSIS — I442 Atrioventricular block, complete: Secondary | ICD-10-CM | POA: Diagnosis not present

## 2023-10-19 DIAGNOSIS — A419 Sepsis, unspecified organism: Secondary | ICD-10-CM | POA: Diagnosis not present

## 2023-10-19 DIAGNOSIS — N39498 Other specified urinary incontinence: Secondary | ICD-10-CM | POA: Diagnosis not present

## 2023-10-19 DIAGNOSIS — E11649 Type 2 diabetes mellitus with hypoglycemia without coma: Secondary | ICD-10-CM | POA: Diagnosis not present

## 2023-10-19 DIAGNOSIS — I700 Atherosclerosis of aorta: Secondary | ICD-10-CM | POA: Diagnosis not present

## 2023-10-19 DIAGNOSIS — J181 Lobar pneumonia, unspecified organism: Secondary | ICD-10-CM | POA: Diagnosis not present

## 2023-10-19 DIAGNOSIS — I4819 Other persistent atrial fibrillation: Secondary | ICD-10-CM | POA: Diagnosis not present

## 2023-10-19 DIAGNOSIS — N401 Enlarged prostate with lower urinary tract symptoms: Secondary | ICD-10-CM | POA: Diagnosis not present

## 2023-10-19 DIAGNOSIS — I2510 Atherosclerotic heart disease of native coronary artery without angina pectoris: Secondary | ICD-10-CM | POA: Diagnosis not present

## 2023-10-21 DIAGNOSIS — I700 Atherosclerosis of aorta: Secondary | ICD-10-CM | POA: Diagnosis not present

## 2023-10-21 DIAGNOSIS — I442 Atrioventricular block, complete: Secondary | ICD-10-CM | POA: Diagnosis not present

## 2023-10-21 DIAGNOSIS — I2510 Atherosclerotic heart disease of native coronary artery without angina pectoris: Secondary | ICD-10-CM | POA: Diagnosis not present

## 2023-10-21 DIAGNOSIS — I4819 Other persistent atrial fibrillation: Secondary | ICD-10-CM | POA: Diagnosis not present

## 2023-10-21 DIAGNOSIS — A419 Sepsis, unspecified organism: Secondary | ICD-10-CM | POA: Diagnosis not present

## 2023-10-21 DIAGNOSIS — N401 Enlarged prostate with lower urinary tract symptoms: Secondary | ICD-10-CM | POA: Diagnosis not present

## 2023-10-21 DIAGNOSIS — N39498 Other specified urinary incontinence: Secondary | ICD-10-CM | POA: Diagnosis not present

## 2023-10-21 DIAGNOSIS — E11649 Type 2 diabetes mellitus with hypoglycemia without coma: Secondary | ICD-10-CM | POA: Diagnosis not present

## 2023-10-21 DIAGNOSIS — J181 Lobar pneumonia, unspecified organism: Secondary | ICD-10-CM | POA: Diagnosis not present

## 2023-10-22 DIAGNOSIS — I442 Atrioventricular block, complete: Secondary | ICD-10-CM | POA: Diagnosis not present

## 2023-10-22 DIAGNOSIS — I2510 Atherosclerotic heart disease of native coronary artery without angina pectoris: Secondary | ICD-10-CM | POA: Diagnosis not present

## 2023-10-22 DIAGNOSIS — E11649 Type 2 diabetes mellitus with hypoglycemia without coma: Secondary | ICD-10-CM | POA: Diagnosis not present

## 2023-10-22 DIAGNOSIS — I4819 Other persistent atrial fibrillation: Secondary | ICD-10-CM | POA: Diagnosis not present

## 2023-10-22 DIAGNOSIS — N401 Enlarged prostate with lower urinary tract symptoms: Secondary | ICD-10-CM | POA: Diagnosis not present

## 2023-10-22 DIAGNOSIS — N39498 Other specified urinary incontinence: Secondary | ICD-10-CM | POA: Diagnosis not present

## 2023-10-22 DIAGNOSIS — J181 Lobar pneumonia, unspecified organism: Secondary | ICD-10-CM | POA: Diagnosis not present

## 2023-10-22 DIAGNOSIS — I700 Atherosclerosis of aorta: Secondary | ICD-10-CM | POA: Diagnosis not present

## 2023-10-22 DIAGNOSIS — A419 Sepsis, unspecified organism: Secondary | ICD-10-CM | POA: Diagnosis not present

## 2023-10-24 DIAGNOSIS — I442 Atrioventricular block, complete: Secondary | ICD-10-CM | POA: Diagnosis not present

## 2023-10-24 DIAGNOSIS — I700 Atherosclerosis of aorta: Secondary | ICD-10-CM | POA: Diagnosis not present

## 2023-10-24 DIAGNOSIS — I2510 Atherosclerotic heart disease of native coronary artery without angina pectoris: Secondary | ICD-10-CM | POA: Diagnosis not present

## 2023-10-24 DIAGNOSIS — I4819 Other persistent atrial fibrillation: Secondary | ICD-10-CM | POA: Diagnosis not present

## 2023-10-24 DIAGNOSIS — A419 Sepsis, unspecified organism: Secondary | ICD-10-CM | POA: Diagnosis not present

## 2023-10-24 DIAGNOSIS — N39498 Other specified urinary incontinence: Secondary | ICD-10-CM | POA: Diagnosis not present

## 2023-10-24 DIAGNOSIS — N401 Enlarged prostate with lower urinary tract symptoms: Secondary | ICD-10-CM | POA: Diagnosis not present

## 2023-10-24 DIAGNOSIS — E11649 Type 2 diabetes mellitus with hypoglycemia without coma: Secondary | ICD-10-CM | POA: Diagnosis not present

## 2023-10-24 DIAGNOSIS — J181 Lobar pneumonia, unspecified organism: Secondary | ICD-10-CM | POA: Diagnosis not present

## 2023-10-28 DIAGNOSIS — J181 Lobar pneumonia, unspecified organism: Secondary | ICD-10-CM | POA: Diagnosis not present

## 2023-10-28 DIAGNOSIS — I442 Atrioventricular block, complete: Secondary | ICD-10-CM | POA: Diagnosis not present

## 2023-10-28 DIAGNOSIS — N39498 Other specified urinary incontinence: Secondary | ICD-10-CM | POA: Diagnosis not present

## 2023-10-28 DIAGNOSIS — A419 Sepsis, unspecified organism: Secondary | ICD-10-CM | POA: Diagnosis not present

## 2023-10-28 DIAGNOSIS — E11649 Type 2 diabetes mellitus with hypoglycemia without coma: Secondary | ICD-10-CM | POA: Diagnosis not present

## 2023-10-28 DIAGNOSIS — I4819 Other persistent atrial fibrillation: Secondary | ICD-10-CM | POA: Diagnosis not present

## 2023-10-28 DIAGNOSIS — I2510 Atherosclerotic heart disease of native coronary artery without angina pectoris: Secondary | ICD-10-CM | POA: Diagnosis not present

## 2023-10-28 DIAGNOSIS — N401 Enlarged prostate with lower urinary tract symptoms: Secondary | ICD-10-CM | POA: Diagnosis not present

## 2023-10-28 DIAGNOSIS — I700 Atherosclerosis of aorta: Secondary | ICD-10-CM | POA: Diagnosis not present

## 2023-10-29 DIAGNOSIS — I700 Atherosclerosis of aorta: Secondary | ICD-10-CM | POA: Diagnosis not present

## 2023-10-29 DIAGNOSIS — A419 Sepsis, unspecified organism: Secondary | ICD-10-CM | POA: Diagnosis not present

## 2023-10-29 DIAGNOSIS — N401 Enlarged prostate with lower urinary tract symptoms: Secondary | ICD-10-CM | POA: Diagnosis not present

## 2023-10-29 DIAGNOSIS — J181 Lobar pneumonia, unspecified organism: Secondary | ICD-10-CM | POA: Diagnosis not present

## 2023-10-29 DIAGNOSIS — E11649 Type 2 diabetes mellitus with hypoglycemia without coma: Secondary | ICD-10-CM | POA: Diagnosis not present

## 2023-10-29 DIAGNOSIS — I2510 Atherosclerotic heart disease of native coronary artery without angina pectoris: Secondary | ICD-10-CM | POA: Diagnosis not present

## 2023-10-29 DIAGNOSIS — I4819 Other persistent atrial fibrillation: Secondary | ICD-10-CM | POA: Diagnosis not present

## 2023-10-29 DIAGNOSIS — N39498 Other specified urinary incontinence: Secondary | ICD-10-CM | POA: Diagnosis not present

## 2023-10-29 DIAGNOSIS — I442 Atrioventricular block, complete: Secondary | ICD-10-CM | POA: Diagnosis not present
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
## 2023-11-05 ENCOUNTER — Ambulatory Visit: Payer: Medicare PPO | Attending: Family Medicine

## 2023-11-05 DIAGNOSIS — R2689 Other abnormalities of gait and mobility: Secondary | ICD-10-CM | POA: Diagnosis not present

## 2023-11-05 DIAGNOSIS — M5459 Other low back pain: Secondary | ICD-10-CM | POA: Insufficient documentation

## 2023-11-05 DIAGNOSIS — M6281 Muscle weakness (generalized): Secondary | ICD-10-CM | POA: Insufficient documentation

## 2023-11-05 DIAGNOSIS — R0609 Other forms of dyspnea: Secondary | ICD-10-CM | POA: Insufficient documentation

## 2023-11-05 DIAGNOSIS — R293 Abnormal posture: Secondary | ICD-10-CM | POA: Insufficient documentation

## 2023-11-06 ENCOUNTER — Ambulatory Visit: Payer: Medicare PPO | Admitting: Occupational Therapy

## 2023-11-06 DIAGNOSIS — M6281 Muscle weakness (generalized): Secondary | ICD-10-CM
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
## 2023-11-11 ENCOUNTER — Other Ambulatory Visit: Payer: Self-pay | Admitting: Cardiology

## 2023-11-11 ENCOUNTER — Ambulatory Visit: Payer: Medicare PPO

## 2023-11-11 ENCOUNTER — Other Ambulatory Visit: Payer: Self-pay

## 2023-11-11 DIAGNOSIS — M6281 Muscle weakness (generalized): Secondary | ICD-10-CM

## 2023-11-11 DIAGNOSIS — R293 Abnormal posture: Secondary | ICD-10-CM | POA: Diagnosis not present

## 2023-11-11 DIAGNOSIS — R2689 Other abnormalities of gait and mobility: Secondary | ICD-10-CM | POA: Diagnosis not present

## 2023-11-11 DIAGNOSIS — I4819 Other persistent atrial fibrillation: Secondary | ICD-10-CM

## 2023-11-11 DIAGNOSIS — R0609 Other forms of dyspnea: Secondary | ICD-10-CM

## 2023-11-11 DIAGNOSIS — M5459 Other low back pain: Secondary | ICD-10-CM | POA: Diagnosis not present

## 2023-11-11 DIAGNOSIS — Z7901 Long term (current) use of anticoagulants: Secondary | ICD-10-CM
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
## 2023-11-11 NOTE — Telephone Encounter (Signed)
Eliquis 5mg  refill request received. Patient is 82 years old, weight-76.2kg, Crea- 0.74 on 10/07/23, Diagnosis-Afib, and last seen by Dr. Cristal Deer on 09/04/23. Dose is appropriate based on dosing criteria. Will send in refill to requested pharmacy.

## 2023-11-13 ENCOUNTER — Ambulatory Visit: Payer: Medicare PPO

## 2023-11-14 ENCOUNTER — Ambulatory Visit: Payer: Medicare PPO | Admitting: Physical Therapy

## 2023-11-14 ENCOUNTER — Encounter: Payer: Self-pay | Admitting: Physical Therapy

## 2023-11-14 DIAGNOSIS — M5459 Other low back pain: Secondary | ICD-10-CM

## 2023-11-14 DIAGNOSIS — R0609 Other forms of dyspnea: Secondary | ICD-10-CM

## 2023-11-14 DIAGNOSIS — M6281 Muscle weakness (generalized): Secondary | ICD-10-CM

## 2023-11-14 DIAGNOSIS — R2689 Other abnormalities of gait and mobility: Secondary | ICD-10-CM | POA: Diagnosis not present

## 2023-11-14 DIAGNOSIS — R293 Abnormal posture: Secondary | ICD-10-CM
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
## 2023-11-14 NOTE — Progress Notes (Signed)
Cardiology Office Note    Date:  11/15/2023  ID:  Mantej, Tritz 02-May-1941, MRN [id number] PCP:  Soundra Pilon, FNP  Cardiologist:  Jodelle Red, MD  Electrophysiologist:  Hillis Range, MD (Inactive)   Chief Complaint: Lower extremity edema   History of Present Illness: .    Benjamin Fuller is a 82 y.o. male with visit-pertinent history of type 2 diabetes, hypertension, hyperlipidemia, paroxysmal atrial fibrillation, symptomatic bradycardia s/p PPM in 11/2021.  First seen in 01/2020 by Dr. Cristal Deer for further evaluation and management of atrial fibrillation. He had an atrial fibrillation ablation in 2022 with Dr. Johney Frame. On 12/21/2021 he underwent successful implantation of a [employer] dual-chamber pacemaker for symptomatic bradycardia due to complete heart block.  At his visit in 03/2023 he was doing well with stable shortness of breath.  He denied arrhythmias or fluid retention.  On 07/03/2023 he was seen by Dr. Tenny Craw and found to be in atrial fibrillation.  On 07/09/2023 he underwent DCCV, when seen in follow-up he was feeling well and maintaining sinus rhythm.  He was last seen in clinic in 09/2023 by Dr. Cristal Deer, he remained stable from a cardiac perspective and his blood pressure was well-controlled.  It was noted that when he saw Dr. Tenny Craw in July he was found to be retaining fluid, he had noted his feet were feeling tight at the end of the day and had noted weight gain.  Mr. Leamy was admitted from 10/02/2023 through 10/07/2023 for sepsis secondary to community-acquired pneumonia.  He presented with fever, tachycardia and tachypnea.  Blood cultures were negative.  He was treated with IV ceftriaxone and azithromycin as well as IV fluids, was also started on prednisone.  He was discharged on 10/07/2023 in stable condition.  Today he presents for lower extremity edema and fatigue.  He reports that following his hospitalization he has been very weak, notes that this has  been improving.  He does note that a week and a half ago he had to take Lasix for 3 days due to lower extremity edema and some weight gain.  He notes that in the last week his weight has been stable but he does note some increased lower extremity edema.  He reports that he gets slightly winded with exertion, denies orthopnea or PND.  He has not taken any Lasix in the last week.  He also notes that he had a blister on his left shin that developed 2 weeks ago and burst, he notes it is now scabbed over and slowly healing.  He reports that his symptoms are not similar to how he felt in July when he was in A-fib, he does not believe that he is in atrial fibrillation.  Labwork independently reviewed: 10/07/2023: Sodium 140, potassium 3.5, creatinine 0.74, magnesium 2.0, hemoglobin 12.4, hematocrit 37.8  ROS: .   Today he denies chest pain, palpitations, melena, hematuria, hemoptysis, diaphoresis, weakness, presyncope, syncope, orthopnea, and PND.  All other systems are reviewed and otherwise negative. Studies Reviewed: Marland Kitchen    EKG:  EKG is ordered today, personally reviewed, demonstrating  EKG Interpretation Date/Time:  Friday November 15 2023 08:39:44 EST Ventricular Rate:  70 PR Interval:    QRS Duration:  124 QT Interval:  454 QTC Calculation: 490 R Axis:   -12  Text Interpretation: Ventricular-paced rhythm Confirmed by Reather Littler [phone]) on 11/15/2023 8:45:24 AM    CV Studies:  Cardiac Studies & Procedures       ECHOCARDIOGRAM  ECHOCARDIOGRAM COMPLETE  03/02/2021  Narrative ECHOCARDIOGRAM REPORT    Patient Name:   Benjamin Fuller Date of Exam: 03/02/2021 Medical Rec #:  [id number]       Height:       66.0 in Accession #:    [id number]      Weight:       175.8 lb Date of Birth:  10/20/1941       BSA:          1.893 m Patient Age:    79 years        BP:           132/69 mmHg Patient Gender: M               HR:           78 bpm. Exam Location:  [address]  Procedure: 2D Echo, Cardiac  Doppler and Color Doppler  Indications:    R06.00 SOB  History:        Patient has prior history of Echocardiogram examinations, most recent 01/14/2020. Arrythmias:Atrial Fibrillation; Risk Factors:Hypertension, Diabetes and HLD.  Sonographer:    Clearence Ped RCS Referring Phys: [phone] BRIDGETTE CHRISTOPHER  IMPRESSIONS   1. Left ventricular ejection fraction, by estimation, is 60 to 65%. The left ventricle has normal function. The left ventricle has no regional wall motion abnormalities. There is mild concentric left ventricular hypertrophy. Left ventricular diastolic function could not be evaluated. Elevated left ventricular end-diastolic pressure. 2. Right ventricular systolic function is normal. The right ventricular size is normal. There is normal pulmonary artery systolic pressure. The estimated right ventricular systolic pressure is 28.0 mmHg. 3. The mitral valve is normal in structure. Mild mitral valve regurgitation. No evidence of mitral stenosis. 4. The aortic valve is normal in structure. Aortic valve regurgitation is not visualized. No aortic stenosis is present. 5. Aortic dilatation noted. There is mild dilatation of the aortic root, measuring 38 mm. 6. The inferior vena cava is normal in size with greater than 50% respiratory variability, suggesting right atrial pressure of 3 mmHg.  FINDINGS Left Ventricle: Left ventricular ejection fraction, by estimation, is 60 to 65%. The left ventricle has normal function. The left ventricle has no regional wall motion abnormalities. The left ventricular internal cavity size was normal in size. There is mild concentric left ventricular hypertrophy. Left ventricular diastolic function could not be evaluated due to atrial fibrillation. Left ventricular diastolic function could not be evaluated. Elevated left ventricular end-diastolic pressure.  Right Ventricle: The right ventricular size is normal. No increase in right ventricular wall  thickness. Right ventricular systolic function is normal. There is normal pulmonary artery systolic pressure. The tricuspid regurgitant velocity is 2.50 m/s, and with an assumed right atrial pressure of 3 mmHg, the estimated right ventricular systolic pressure is 28.0 mmHg.  Left Atrium: Left atrial size was normal in size.  Right Atrium: Right atrial size was normal in size.  Pericardium: There is no evidence of pericardial effusion.  Mitral Valve: The mitral valve is normal in structure. Mild mitral valve regurgitation. No evidence of mitral valve stenosis.  Tricuspid Valve: The tricuspid valve is normal in structure. Tricuspid valve regurgitation is mild . No evidence of tricuspid stenosis.  Aortic Valve: The aortic valve is normal in structure. Aortic valve regurgitation is not visualized. No aortic stenosis is present.  Pulmonic Valve: The pulmonic valve was normal in structure. Pulmonic valve regurgitation is trivial. No evidence of pulmonic stenosis.  Aorta: Aortic dilatation noted. There is mild dilatation of  the aortic root, measuring 38 mm.  Venous: The inferior vena cava is normal in size with greater than 50% respiratory variability, suggesting right atrial pressure of 3 mmHg.  IAS/Shunts: No atrial level shunt detected by color flow Doppler.   LEFT VENTRICLE PLAX 2D LVIDd:         3.10 cm  Diastology LVIDs:         2.30 cm  LV e' medial:    6.09 cm/s LV PW:         1.20 cm  LV E/e' medial:  18.4 LV IVS:        1.20 cm  LV e' lateral:   10.30 cm/s LVOT diam:     2.10 cm  LV E/e' lateral: 10.9 LV SV:         55 LV SV Index:   29 LVOT Area:     3.46 cm   RIGHT VENTRICLE RV Basal diam:  3.00 cm RV S prime:     12.00 cm/s TAPSE (M-mode): 1.4 cm RVSP:           28.0 mmHg  LEFT ATRIUM             Index       RIGHT ATRIUM           Index LA diam:        3.90 cm 2.06 cm/m  RA Pressure: 3.00 mmHg LA Vol (A2C):   42.6 ml 22.50 ml/m RA Area:     13.40 cm LA Vol  (A4C):   32.8 ml 17.33 ml/m RA Volume:   29.80 ml  15.74 ml/m LA Biplane Vol: 37.4 ml 19.76 ml/m AORTIC VALVE LVOT Vmax:   72.67 cm/s LVOT Vmean:  52.033 cm/s LVOT VTI:    0.160 m  AORTA Ao Root diam: 3.80 cm Ao Asc diam:  3.60 cm  MITRAL VALVE                 TRICUSPID VALVE MV Area (PHT):               TR Peak grad:   25.0 mmHg MV Decel Time:               TR Vmax:        250.00 cm/s MR Peak grad:    69.9 mmHg   Estimated RAP:  3.00 mmHg MR Mean grad:    46.0 mmHg   RVSP:           28.0 mmHg MR Vmax:         418.00 cm/s MR Vmean:        318.0 cm/s  SHUNTS MR PISA:         1.27 cm    Systemic VTI:  0.16 m MR PISA Eff ROA: 9 mm       Systemic Diam: 2.10 cm MR PISA Radius:  0.45 cm MV E velocity: 112.00 cm/s  Armanda Magic MD Electronically signed by Armanda Magic MD Signature Date/Time: 03/02/2021/2:10:44 PM    Final              Current Reported Medications:.    Current Meds  Medication Sig   albuterol (VENTOLIN HFA) 108 (90 Base) MCG/ACT inhaler Inhale 1 puff into the lungs every 6 (six) hours as needed for wheezing.   apixaban (ELIQUIS) 5 MG TABS tablet TAKE 1 TABLET TWICE DAILY   benzonatate (TESSALON) 100 MG capsule Take 1 capsule (100 mg total) by mouth 3 (three) times daily.  diphenhydramine-acetaminophen (TYLENOL PM) 25-500 MG TABS tablet Take 1 tablet by mouth at bedtime.   Emollient (EUCERIN) lotion Apply 1 Application topically as needed for dry skin (affected areas).   ferrous gluconate (FERGON) 324 MG tablet Take 324 mg by mouth daily with breakfast.   glimepiride (AMARYL) 4 MG tablet Take 4 mg by mouth 2 (two) times daily.    guaiFENesin (MUCINEX) 600 MG 12 hr tablet Take 1 tablet (600 mg total) by mouth 2 (two) times daily.   JARDIANCE 25 MG TABS tablet Take 25 mg by mouth daily.   metFORMIN (GLUCOPHAGE-XR) 500 MG 24 hr tablet Take 500 mg by mouth daily with breakfast.   Multiple Vitamin (MULTIVITAMIN WITH MINERALS) TABS tablet Take 1 tablet by mouth  daily with breakfast.   pioglitazone (ACTOS) 15 MG tablet Take 15 mg by mouth daily after lunch.   predniSONE (DELTASONE) 10 MG tablet Take 20mg  daily for 3days,Take 10mg  daily for 3days, then stop   ramipril (ALTACE) 10 MG capsule Take 10 mg by mouth in the morning.   simvastatin (ZOCOR) 40 MG tablet Take 40 mg by mouth at bedtime.   sodium chloride (OCEAN) 0.65 % SOLN nasal spray Place 1 spray into both nostrils at bedtime.   tamsulosin (FLOMAX) 0.4 MG CAPS capsule Take 0.4 mg by mouth in the morning.   TYLENOL 8 HOUR ARTHRITIS PAIN 650 MG CR tablet Take 650-1,300 mg by mouth every 8 (eight) hours as needed for fever.   vitamin B-12 (CYANOCOBALAMIN) 1000 MCG tablet Take 1,000 mcg by mouth daily with lunch.   [DISCONTINUED] ondansetron (ZOFRAN-ODT) 4 MG disintegrating tablet Take 1 tablet (4 mg total) by mouth every 8 (eight) hours as needed for nausea or vomiting.   Physical Exam:    VS:  BP 124/60   Pulse 70   Ht 5\' 5"  (1.651 m)   Wt 175 lb (79.4 kg)   BMI 29.12 kg/m    Wt Readings from Last 3 Encounters:  11/15/23 175 lb (79.4 kg)  10/02/23 168 lb (76.2 kg)  09/04/23 171 lb 9.6 oz (77.8 kg)    GEN: Well nourished, well developed in no acute distress NECK: No JVD; No carotid bruits CARDIAC: RRR, no murmurs, rubs, gallops RESPIRATORY:  Clear to auscultation without rales, wheezing or rhonchi  ABDOMEN: Soft, non-tender, non-distended EXTREMITIES:  +1 bilateral lower extremity edema; No acute deformity   Asessement and Plan:.    Lower extremity edema/dyspnea on exertion: Patient notes that following his hospitalization he has remained weak and has had some lower extremity edema and weight gain as well as some dyspnea on exertion.  Of note he was on oral prednisone for a week following hospitalization. He took Lasix for 3 days a week and a half ago with some improvement, notes that the last week his weight has been stable but he does have some lower extremity edema.  On exam he has +1  bilateral edema up to mid shin, he otherwise appears euvolemic and well compensated. Check echocardiogram.  Will have him take Lasix 40 mg daily for 3 days, instructed if his blood pressure was less than 120/60 to hold his ramipril with increased Lasix.  Check CBC, BMET and pro BNP today.   Paroxysmal atrial fibrillation: Hx of afib ablation in 2022 with Dr. Johney Frame. S/p DCCV in July, 2024. EKG today shows V-pacing, he reports that he does not feel that he is in atrial fibrillation.  Will send a message to device clinic requesting an at home interrogation  given ongoing weakness and fluid accumulation. He denies bleeding problems on Eliquis. Check CBC and BMET today. Continue Eliquis 5 mg twice daily.  Addendum: Patient device report indicated that he has been in persistent atrial fibrillation, has been referred to the afib clinic.   History of CHB: s/p PPM in 2022. Device interrogation on 10/02/2023 showed normal device function, 1 run of NSVT 12 beats in duration, 4 brief ATR's.  Hypertension: Blood pressure today 124/50, on recheck was 124/60. Will have him take Lasix 40 mg daily for 3 days, instructed if his blood pressure is less than 120/60 to hold his ramipril with increased Lasix.  Hyperlipidemia: Primary care plans to recheck lipid profile in December. On simvastatin 40 mg daily.   Type II diabetes: Last hemoglobin A1c 7.1 on 10/07/2023.  Discussed with patient that pioglitazone can contribute to fluid accumulation.  Will repeat echocardiogram, patient also plans to see his PCP next month he will discuss possible discontinuation with him.   Left lower extremity wound: Patient notes that a few weeks ago he developed a blister on his left shin that popped and has now scabbed over.  There are currently no signs of infection and appears to be healing.  Discussed with patient that if he develops any spreading redness, warmth or drainage from the wound to notify his PCP.  Disposition: F/u with Reather Littler, NP in four weeks. F/u with Francis Dowse on 11/25 as scheduled.   Signed, Rip Harbour, NP

## 2023-11-15 ENCOUNTER — Ambulatory Visit: Payer: Medicare PPO | Attending: Cardiology | Admitting: Cardiology

## 2023-11-15 ENCOUNTER — Encounter: Payer: Self-pay | Admitting: Cardiology

## 2023-11-15 ENCOUNTER — Telehealth: Payer: Self-pay

## 2023-11-15 VITALS — BP 124/60 | HR 70 | Ht 65.0 in | Wt 175.0 lb

## 2023-11-15 DIAGNOSIS — E119 Type 2 diabetes mellitus without complications: Secondary | ICD-10-CM | POA: Diagnosis not present

## 2023-11-15 DIAGNOSIS — I480 Paroxysmal atrial fibrillation: Secondary | ICD-10-CM | POA: Diagnosis not present

## 2023-11-15 DIAGNOSIS — E782 Mixed hyperlipidemia: Secondary | ICD-10-CM | POA: Diagnosis not present

## 2023-11-15 DIAGNOSIS — I10 Essential (primary) hypertension: Secondary | ICD-10-CM

## 2023-11-15 DIAGNOSIS — R600 Localized edema: Secondary | ICD-10-CM | POA: Diagnosis not present

## 2023-11-15 DIAGNOSIS — D6869 Other thrombophilia: Secondary | ICD-10-CM

## 2023-11-15 DIAGNOSIS — Z950 Presence of cardiac pacemaker: Secondary | ICD-10-CM | POA: Diagnosis not present

## 2023-11-15 DIAGNOSIS — R0602 Shortness of breath: Secondary | ICD-10-CM | POA: Diagnosis not present

## 2023-11-15 NOTE — Telephone Encounter (Signed)
Patient reports of increased fatigue and lower extremities swelling x2 weeks. Patient has hx of paroxysmal PAF but now is in persistent AF. Advised recommend AF clinic referral to discuss further. Patient agreeable to plan. Appreciative of call.

## 2023-11-15 NOTE — Telephone Encounter (Signed)
Called patient to assist with manual transmission. No answer, left message on VM to send manual transmission. Also advised pt I will send instructions via mychart as well.

## 2023-11-15 NOTE — Patient Instructions (Signed)
Medication Instructions:  Take Lasix 40 mg for the next 3 days then back to as needed for 3lbs overnight or 5 lbs over the week. Hold Rampril for blood pressure less than 120/60 *If you need a refill on your cardiac medications before your next appointment, please call your pharmacy*   Lab Work: Today we will draw CBC, BMP, Pro BNP If you have labs (blood work) drawn today and your tests are completely normal, you will receive your results only by: MyChart Message (if you have MyChart) OR A paper copy in the mail If you have any lab test that is abnormal or we need to change your treatment, we will call you to review the results.   Testing/Procedures: Your physician has requested that you have an echocardiogram. Echocardiography is a painless test that uses sound waves to create images of your heart. It provides your doctor with information about the size and shape of your heart and how well your heart's chambers and valves are working. This procedure takes approximately one hour. There are no restrictions for this procedure. Please do NOT wear cologne, perfume, aftershave, or lotions (deodorant is allowed). Please arrive 15 minutes prior to your appointment time.  Please note: We ask at that you not bring children with you during ultrasound (echo/ vascular) testing. Due to room size and safety concerns, children are not allowed in the ultrasound rooms during exams. Our front office staff cannot provide observation of children in our lobby area while testing is being conducted. An adult accompanying a patient to their appointment will only be allowed in the ultrasound room at the discretion of the ultrasound technician under special circumstances. We apologize for any inconvenience.    Follow-Up: At [facility], you and your health needs are our priority.  As part of our continuing mission to provide you with exceptional heart care, we have created designated Provider Care Teams.   These Care Teams include your primary Cardiologist (physician) and Advanced Practice Providers (APPs -  Physician Assistants and Nurse Practitioners) who all work together to provide you with the care you need, when you need it.  We recommend signing up for the patient portal called "MyChart".  Sign up information is provided on this After Visit Summary.  MyChart is used to connect with patients for Virtual Visits (Telemedicine).  Patients are able to view lab/test results, encounter notes, upcoming appointments, etc.  Non-urgent messages can be sent to your provider as well.   To learn more about what you can do with MyChart, go to [email or web address].    Your next appointment:   4 week(s)  Provider:   Jodelle Red, MD  or Reather Littler, NP

## 2023-11-15 NOTE — Telephone Encounter (Signed)
-----   Message from [facility] [place] sent at 11/15/2023 12:58 PM EST ----- Regarding: Device interrgation Hello,  I saw Benjamin Fuller today in clinic for fatigue and edema. He said he didn't feel he that had been in afib recently but symptoms are similar to his last episode. He didn't know how to send an interrogation is there any chance someone can reach out and help him so we can do a device interrogation?  Thanks! Reather Littler, NP

## 2023-11-16 LAB — PRO B NATRIURETIC PEPTIDE: NT-Pro BNP: 831 pg/mL — ABNORMAL HIGH (ref 0–486)

## 2023-11-16 LAB — CBC
Hematocrit: 37.5 % (ref 37.5–51.0)
Hemoglobin: 11.9 g/dL — ABNORMAL LOW (ref 13.0–17.7)
MCH: 30.9 pg (ref 26.6–33.0)
MCHC: 31.7 g/dL (ref 31.5–35.7)
MCV: 97 fL (ref 79–97)
Platelets: 213 10*3/uL (ref 150–450)
RBC: 3.85 x10E6/uL — ABNORMAL LOW (ref 4.14–5.80)
RDW: 13.1 % (ref 11.6–15.4)
WBC: 5.7 10*3/uL (ref 3.4–10.8)

## 2023-11-16 LAB — BASIC METABOLIC PANEL
BUN/Creatinine Ratio: 16 (ref 10–24)
BUN: 14 mg/dL (ref 8–27)
CO2: 21 mmol/L (ref 20–29)
Calcium: 9.1 mg/dL (ref 8.6–10.2)
Chloride: 103 mmol/L (ref 96–106)
Creatinine, Ser: 0.85 mg/dL (ref 0.76–1.27)
Glucose: 308 mg/dL — ABNORMAL HIGH (ref 70–99)
Potassium: 4.8 mmol/L (ref 3.5–5.2)
Sodium: 139 mmol/L (ref 134–144)
eGFR: 87 mL/min/{1.73_m2} (ref >59)

## 2023-11-18 ENCOUNTER — Other Ambulatory Visit: Payer: Self-pay

## 2023-11-18 ENCOUNTER — Ambulatory Visit: Payer: Medicare PPO

## 2023-11-18 DIAGNOSIS — R0609 Other forms of dyspnea: Secondary | ICD-10-CM | POA: Diagnosis not present

## 2023-11-18 DIAGNOSIS — M6281 Muscle weakness (generalized): Secondary | ICD-10-CM

## 2023-11-18 DIAGNOSIS — R293 Abnormal posture: Secondary | ICD-10-CM

## 2023-11-18 DIAGNOSIS — M5459 Other low back pain: Secondary | ICD-10-CM

## 2023-11-18 DIAGNOSIS — R2689 Other abnormalities of gait and mobility: Secondary | ICD-10-CM | POA: Diagnosis not present
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
## 2023-11-19 ENCOUNTER — Telehealth: Payer: Self-pay

## 2023-11-19 NOTE — Telephone Encounter (Signed)
-----   Message from [facility] [place] sent at 11/18/2023 12:21 PM EST ----- Please let Benjamin Fuller know that his labs show some mild anemia, overall stable. His kidney function and electrolytes are normal. His BNP which indicates his fluid status was slightly increased, we are treating this with Lasix as discussed at recent office visit. Please notify us of worsening swelling, shortness of breath or weight gain of 2-3 lbs overnight or 5 lbs in a week. Continue current medications and follow up with afib clinic as scheduled.

## 2023-11-19 NOTE — Telephone Encounter (Signed)
Called patient advised of below they verbalized understanding.

## 2023-11-20 NOTE — Progress Notes (Deleted)
Cardiology Office Note:  .   Date:  11/20/2023  ID:  [name], DOB 21-Dec-1941, MRN [id number] PCP: Soundra Pilon, FNP  [facility] Providers Cardiologist:  Jodelle Red, MD Electrophysiologist:  Hillis Range, MD (Inactive) >> Dr. Ladona Ridgel  History of Present Illness: .   Benjamin Fuller is a 82 y.o. male w/PMHx of AFib, CHB w/PPM, DM,  HTN, HLD, LBBB  He saw Dr. Ladona Ridgel 03/27/22, doing well, maintaining SR, no changes were made.  admitted from 10/02/2023 through 10/07/2023 for sepsis secondary to community-acquired pneumonia.  He presented with fever, tachycardia and tachypnea.  Blood cultures were negative.  He was treated with IV ceftriaxone and azithromycin as well as IV fluids, was also started on prednisone.  He was discharged on 10/07/2023 in stable condition.   Following regularly with cardiology team. Last was with K. Shelva Majestic, NP on 11.25.24, using PRN lasix, reporting edema, SOB, had a slow healing wound LLE Device  remote noted persistent AFib  post DCCV July 2024 > planned to see the AFib clinic BNP was elevated and advised on how to use PRN lasix  Pending updated echo  Today's visit is scheduled as an annual visit  ROS: ***  *** AF burden *** AAD options, ? Tikosyn, amio, not flec with clinical HF *** pending echo *** eliquis, dose, bleeding, labs *** volume  Device information BSCi dual chamber PPM implanted 112/22/22  Arrhythmia/AAD hx Diagnosed Jan 2021 PVI ablation 03/30/21  No AAD to date  Studies Reviewed: Marland Kitchen    EKG not done today  DEVICE interrogation done today and reviewed by myself *** Battery and lead measurements are good ***  03/30/2021: EPS, ablation CONCLUSIONS: 1. Atrial fibrillation upon presentation.   2. Intracardiac echo reveals a moderate sized left atrium with four separate pulmonary veins without evidence of pulmonary vein stenosis. 3. Successful electrical isolation and anatomical encircling of all four pulmonary  veins with radiofrequency current.  A WACA approach was used 3. Additional left atrial ablation was performed with a standard box lesion created along the posterior wall of the left atrium 4. Atrial fibrillation successfully cardioverted to sinus rhythm. 5. No early apparent complications.   03/02/2021: TTE 1. Left ventricular ejection fraction, by estimation, is 60 to 65%. The  left ventricle has normal function. The left ventricle has no regional  wall motion abnormalities. There is mild concentric left ventricular  hypertrophy. Left ventricular diastolic  function could not be evaluated. Elevated left ventricular end-diastolic  pressure.   2. Right ventricular systolic function is normal. The right ventricular  size is normal. There is normal pulmonary artery systolic pressure. The  estimated right ventricular systolic pressure is 28.0 mmHg.   3. The mitral valve is normal in structure. Mild mitral valve  regurgitation. No evidence of mitral stenosis.   4. The aortic valve is normal in structure. Aortic valve regurgitation is  not visualized. No aortic stenosis is present.   5. Aortic dilatation noted. There is mild dilatation of the aortic root,  measuring 38 mm.   6. The inferior vena cava is normal in size with greater than 50%  respiratory variability, suggesting right atrial pressure of 3 mmHg.    Risk Assessment/Calculations:    Physical Exam:   VS:  There were no vitals taken for this visit.   Wt Readings from Last 3 Encounters:  11/15/23 175 lb (79.4 kg)  10/02/23 168 lb (76.2 kg)  09/04/23 171 lb 9.6 oz (77.8 kg)    GEN:  Well nourished, well developed in no acute distress NECK: No JVD; No carotid bruits CARDIAC: ***RRR, no murmurs, rubs, gallops RESPIRATORY:  *** CTA b/l without rales, wheezing or rhonchi  ABDOMEN: Soft, non-tender, non-distended EXTREMITIES:  No edema; No deformity   PPM site: *** is stable, no thinning, fluctuation, tethering  ASSESSMENT AND  PLAN: .    persistent AFib CHA2DS2Vasc is 5, on Eliquis, appropriately dosed *** % burden  PPM ***  Chronic CHF (presumed HFpEF) Being treated clinically for HF Pending updated echo *** C/w Dr. Melissa Noon  HTN ***  Secondary hypercoagulable state 2/2 AFib     {Are you ordering a CV Procedure (e.g. stress test, cath, DCCV, TEE, etc)?   Press F2        :[id number]}     Dispo: ***  Signed, Sheilah Pigeon, PA-C

## 2023-11-21 ENCOUNTER — Ambulatory Visit: Payer: Medicare PPO | Admitting: Physical Therapy

## 2023-11-21 DIAGNOSIS — R2689 Other abnormalities of gait and mobility: Secondary | ICD-10-CM | POA: Diagnosis not present

## 2023-11-21 DIAGNOSIS — M6281 Muscle weakness (generalized): Secondary | ICD-10-CM | POA: Diagnosis not present

## 2023-11-21 DIAGNOSIS — M5459 Other low back pain: Secondary | ICD-10-CM | POA: Diagnosis not present

## 2023-11-21 DIAGNOSIS — R0609 Other forms of dyspnea: Secondary | ICD-10-CM | POA: Diagnosis not present

## 2023-11-21 DIAGNOSIS — R293 Abnormal posture: Secondary | ICD-10-CM
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
## 2023-11-22 ENCOUNTER — Ambulatory Visit (HOSPITAL_COMMUNITY)
Admission: RE | Admit: 2023-11-22 | Discharge: 2023-11-22 | Disposition: A | Discharge status: Home / Self Care | Payer: Medicare PPO | Source: Ambulatory Visit | Attending: Internal Medicine | Admitting: Internal Medicine

## 2023-11-22 ENCOUNTER — Telehealth: Payer: Self-pay

## 2023-11-22 VITALS — BP 130/56 | HR 70 | Ht 65.0 in | Wt 170.6 lb

## 2023-11-22 DIAGNOSIS — I10 Essential (primary) hypertension: Secondary | ICD-10-CM | POA: Insufficient documentation

## 2023-11-22 DIAGNOSIS — I4819 Other persistent atrial fibrillation: Secondary | ICD-10-CM | POA: Diagnosis not present

## 2023-11-22 DIAGNOSIS — Z7901 Long term (current) use of anticoagulants: Secondary | ICD-10-CM | POA: Insufficient documentation

## 2023-11-22 DIAGNOSIS — I4891 Unspecified atrial fibrillation: Secondary | ICD-10-CM

## 2023-11-22 DIAGNOSIS — Z7984 Long term (current) use of oral hypoglycemic drugs: Secondary | ICD-10-CM | POA: Diagnosis not present

## 2023-11-22 DIAGNOSIS — E119 Type 2 diabetes mellitus without complications: Secondary | ICD-10-CM | POA: Diagnosis not present

## 2023-11-22 DIAGNOSIS — D6869 Other thrombophilia: Secondary | ICD-10-CM | POA: Diagnosis not present

## 2023-11-22 DIAGNOSIS — E785 Hyperlipidemia, unspecified: Secondary | ICD-10-CM | POA: Insufficient documentation
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
## 2023-11-22 NOTE — Patient Instructions (Addendum)
Cardioversion scheduled for: Wednesday, November 27th   - Arrive at the [facility] and go to admitting at 8am   - Do not eat or drink anything after midnight the night prior to your procedure.   - Take all your morning medication (except diabetic medications) with a sip of water prior to arrival.  - You will not be able to drive home after your procedure.    - Do NOT miss any doses of your blood thinner - if you should miss a dose please notify our office immediately.   - If you feel as if you go back into normal rhythm prior to scheduled cardioversion, please notify our office immediately.   If your procedure is canceled in the cardioversion suite you will be charged a cancellation fee.     Hold below medications 72 hours prior to scheduled procedure/anesthesia. Restart medication on the following day after scheduled procedure/anesthesia  Empagliflozin (Jardiance)     For those patients who have a scheduled procedure/anesthesia on the same day of the week as their dose, hold the medication on the day of surgery.  They can take their scheduled dose the week before.  **Patients on the above medications scheduled for elective procedures that have not held the medication for the appropriate amount of time are at risk of cancellation or change in the anesthetic plan.
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
## 2023-11-22 NOTE — Telephone Encounter (Signed)
Outreach made to Pt.  Spoke with Pt's wife.  Per wife, they have already left to go to their afib clinic visit.  No action needed.

## 2023-11-25 ENCOUNTER — Other Ambulatory Visit: Payer: Self-pay

## 2023-11-25 ENCOUNTER — Ambulatory Visit: Payer: Medicare PPO

## 2023-11-25 ENCOUNTER — Ambulatory Visit: Payer: Medicare PPO | Admitting: Physician Assistant

## 2023-11-25 DIAGNOSIS — R0609 Other forms of dyspnea: Secondary | ICD-10-CM | POA: Diagnosis not present

## 2023-11-25 DIAGNOSIS — R2689 Other abnormalities of gait and mobility: Secondary | ICD-10-CM | POA: Diagnosis not present

## 2023-11-25 DIAGNOSIS — M5459 Other low back pain: Secondary | ICD-10-CM | POA: Diagnosis not present

## 2023-11-25 DIAGNOSIS — M6281 Muscle weakness (generalized): Secondary | ICD-10-CM

## 2023-11-25 DIAGNOSIS — R293 Abnormal posture: Secondary | ICD-10-CM | POA: Diagnosis not present
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
## 2023-11-26 NOTE — Progress Notes (Signed)
Spoke to pt and instructed them to come at 0745 and to be NPO after 0000.  Confirmed no missed doses of AC and instructed to take in AM with a small sip of water.  Confirmed that pt will have a ride home and someone to stay with them for 24 hours after the procedure. Instructed patient to not wear any jewelry or lotion.

## 2023-11-27 ENCOUNTER — Encounter (HOSPITAL_COMMUNITY)
Admission: RE | Disposition: A | Discharge status: Home / Self Care | Payer: Self-pay | Source: Home / Self Care | Attending: Internal Medicine

## 2023-11-27 ENCOUNTER — Ambulatory Visit (HOSPITAL_COMMUNITY)
Admission: RE | Admit: 2023-11-27 | Discharge: 2023-11-27 | Disposition: A | Discharge status: Home / Self Care | Payer: Medicare PPO | Attending: Internal Medicine | Admitting: Internal Medicine

## 2023-11-27 ENCOUNTER — Encounter (HOSPITAL_COMMUNITY): Payer: Self-pay | Admitting: Internal Medicine

## 2023-11-27 ENCOUNTER — Ambulatory Visit (HOSPITAL_COMMUNITY): Payer: Medicare PPO | Admitting: Anesthesiology

## 2023-11-27 ENCOUNTER — Ambulatory Visit: Payer: Medicare PPO

## 2023-11-27 ENCOUNTER — Other Ambulatory Visit: Payer: Self-pay

## 2023-11-27 DIAGNOSIS — Z87891 Personal history of nicotine dependence: Secondary | ICD-10-CM | POA: Insufficient documentation

## 2023-11-27 DIAGNOSIS — I4891 Unspecified atrial fibrillation: Secondary | ICD-10-CM

## 2023-11-27 DIAGNOSIS — I10 Essential (primary) hypertension: Secondary | ICD-10-CM | POA: Diagnosis not present

## 2023-11-27 DIAGNOSIS — E119 Type 2 diabetes mellitus without complications: Secondary | ICD-10-CM | POA: Diagnosis not present

## 2023-11-27 DIAGNOSIS — Z950 Presence of cardiac pacemaker: Secondary | ICD-10-CM | POA: Insufficient documentation

## 2023-11-27 DIAGNOSIS — I4819 Other persistent atrial fibrillation: Secondary | ICD-10-CM | POA: Diagnosis not present

## 2023-11-27 DIAGNOSIS — Z7901 Long term (current) use of anticoagulants: Secondary | ICD-10-CM | POA: Diagnosis not present

## 2023-11-27 DIAGNOSIS — Z7984 Long term (current) use of oral hypoglycemic drugs: Secondary | ICD-10-CM | POA: Diagnosis not present

## 2023-11-27 HISTORY — PX: CARDIOVERSION: EP1203

## 2023-11-27 LAB — GLUCOSE, CAPILLARY: Glucose-Capillary: 176 mg/dL — ABNORMAL HIGH (ref 70–99)

## 2023-11-27 SURGERY — CARDIOVERSION (CATH LAB)
Anesthesia: Monitor Anesthesia Care

## 2023-11-27 MED ORDER — SODIUM CHLORIDE 0.9% FLUSH: 10.0000 mL | Freq: Two times a day (BID) | INTRAVENOUS | Status: DC

## 2023-11-27 MED ORDER — LIDOCAINE 2% (20 MG/ML) 5 ML SYRINGE
INTRAMUSCULAR | Status: DC | PRN
  Administered 2023-11-27: 60 mg via INTRAVENOUS

## 2023-11-27 MED ORDER — PROPOFOL 10 MG/ML IV BOLUS
INTRAVENOUS | Status: DC | PRN
  Administered 2023-11-27: 50 mg via INTRAVENOUS

## 2023-11-27 SURGICAL SUPPLY — 1 items: PAD DEFIB RADIO PHYSIO CONN (PAD) ×1 IMPLANT

## 2023-11-27 NOTE — Transfer of Care (Signed)
Immediate Anesthesia Transfer of Care Note  Patient: Benjamin Fuller  Procedure(s) Performed: CARDIOVERSION (CATH LAB)  Patient Location: PACU and cath lab 3  Anesthesia Type:MAC  Level of Consciousness: sedated  Airway & Oxygen Therapy: Patient Spontanous Breathing and Patient connected to nasal cannula oxygen  Post-op Assessment: Report given to RN  Post vital signs: stable  Last Vitals:  Vitals Value Taken Time  BP    Temp    Pulse 70 11/27/23 0819  Resp 20 11/27/23 0819  SpO2 99 % 11/27/23 0819  Vitals shown include unfiled device data.  Last Pain:  Vitals:   11/27/23 0752  TempSrc: Temporal  PainSc: 0-No pain         Complications: There were no known notable events for this encounter.

## 2023-11-27 NOTE — Anesthesia Preprocedure Evaluation (Signed)
Anesthesia Evaluation  Patient identified by MRN, date of birth, ID band Patient awake    Reviewed: Allergy & Precautions, H&P , NPO status , Patient's Chart, lab work & pertinent test results  Airway Mallampati: III  TM Distance: >3 FB Neck ROM: Full    Dental no notable dental hx. (+) Teeth Intact, Dental Advisory Given   Pulmonary neg pulmonary ROS, former smoker   Pulmonary exam normal breath sounds clear to auscultation       Cardiovascular hypertension, + dysrhythmias Atrial Fibrillation + pacemaker  Rhythm:Irregular Rate:Normal     Neuro/Psych negative neurological ROS  negative psych ROS   GI/Hepatic negative GI ROS, Neg liver ROS,,,  Endo/Other  diabetes, Type 2, Oral Hypoglycemic Agents    Renal/GU negative Renal ROS  negative genitourinary   Musculoskeletal  (+) Arthritis , Osteoarthritis,    Abdominal   Peds  Hematology  (+) Blood dyscrasia, anemia   Anesthesia Other Findings   Reproductive/Obstetrics negative OB ROS                             Anesthesia Physical Anesthesia Plan  ASA: 3  Anesthesia Plan: General   Post-op Pain Management: Minimal or no pain anticipated   Induction: Intravenous  PONV Risk Score and Plan: 2 and Propofol infusion  Airway Management Planned: Natural Airway and Nasal Cannula  Additional Equipment:   Intra-op Plan:   Post-operative Plan:   Informed Consent: I have reviewed the patients History and Physical, chart, labs and discussed the procedure including the risks, benefits and alternatives for the proposed anesthesia with the patient or authorized representative who has indicated his/her understanding and acceptance.     Dental advisory given  Plan Discussed with: CRNA  Anesthesia Plan Comments:        Anesthesia Quick Evaluation

## 2023-11-27 NOTE — Anesthesia Postprocedure Evaluation (Signed)
Anesthesia Post Note  Patient: Benjamin Fuller  Procedure(s) Performed: CARDIOVERSION (CATH LAB)     Patient location during evaluation: Cath Lab Anesthesia Type: MAC Level of consciousness: awake and alert Pain management: pain level controlled Vital Signs Assessment: post-procedure vital signs reviewed and stable Respiratory status: spontaneous breathing, nonlabored ventilation and respiratory function stable Cardiovascular status: blood pressure returned to baseline and stable Postop Assessment: no apparent nausea or vomiting Anesthetic complications: no  There were no known notable events for this encounter.  Last Vitals:  Vitals:   11/27/23 0752 11/27/23 0833  BP: (!) 141/70   Pulse: 70   Resp: 15   Temp: (!) 36.4 C 36.8 C  SpO2: 95%     Last Pain:  Vitals:   11/27/23 0833  TempSrc: Temporal  PainSc: 0-No pain                 Merlin Ege,W. EDMOND

## 2023-11-27 NOTE — Discharge Instructions (Signed)

## 2023-11-27 NOTE — CV Procedure (Addendum)
Procedure: Electrical Cardioversion Indications:  Atrial Fibrillation  Procedure Details:  Consent: Risks of procedure as well as the alternatives and risks of each were explained to the (patient/caregiver).  Consent for procedure obtained.  Time Out: Verified patient identification, verified procedure, site/side was marked, verified correct patient position, special equipment/implants available, medications/allergies/relevent history reviewed, required imaging and test results available. PERFORMED.  Patient placed on cardiac monitor, pulse oximetry, supplemental oxygen as necessary.  Sedation given:  lidocaine/propofol Pacer pads placed anterior and posterior chest.  Cardioverted 1 time(s).  Cardioversion with synchronized biphasic 360J shock.  Evaluation: Findings: Post procedure EKG shows: AV paced, sinus rhythm Complications: None Patient did tolerate procedure well.  Time Spent Directly with the Patient:  15 minutes   Benjamin Fuller 11/27/2023, 8:27 AM

## 2023-11-27 NOTE — Interval H&P Note (Signed)
History and Physical Interval Note:  11/27/2023 7:34 AM  Benjamin Fuller  has presented today for surgery, with the diagnosis of AFIB.  The various methods of treatment have been discussed with the patient and family. After consideration of risks, benefits and other options for treatment, the patient has consented to  Procedure(s): CARDIOVERSION (CATH LAB) (N/A) as a surgical intervention.  The patient's history has been reviewed, patient examined, no change in status, stable for surgery.  I have reviewed the patient's chart and labs.  Questions were answered to the patient's satisfaction.     Maisie Fus

## 2023-12-04 ENCOUNTER — Ambulatory Visit: Payer: Medicare PPO | Attending: Family Medicine

## 2023-12-04 DIAGNOSIS — R0609 Other forms of dyspnea: Secondary | ICD-10-CM | POA: Insufficient documentation

## 2023-12-04 DIAGNOSIS — M5459 Other low back pain: Secondary | ICD-10-CM | POA: Diagnosis not present

## 2023-12-04 DIAGNOSIS — R293 Abnormal posture: Secondary | ICD-10-CM | POA: Insufficient documentation

## 2023-12-04 DIAGNOSIS — M545 Low back pain, unspecified: Secondary | ICD-10-CM | POA: Diagnosis not present

## 2023-12-04 DIAGNOSIS — R2689 Other abnormalities of gait and mobility: Secondary | ICD-10-CM | POA: Insufficient documentation

## 2023-12-04 DIAGNOSIS — M6281 Muscle weakness (generalized): Secondary | ICD-10-CM | POA: Diagnosis not present
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
## 2023-12-06 ENCOUNTER — Ambulatory Visit: Payer: Medicare PPO | Admitting: Physical Therapy

## 2023-12-06 ENCOUNTER — Encounter: Payer: Self-pay | Admitting: Physical Therapy

## 2023-12-06 DIAGNOSIS — M545 Low back pain, unspecified: Secondary | ICD-10-CM | POA: Diagnosis not present

## 2023-12-06 DIAGNOSIS — R293 Abnormal posture: Secondary | ICD-10-CM | POA: Diagnosis not present

## 2023-12-06 DIAGNOSIS — R0609 Other forms of dyspnea: Secondary | ICD-10-CM | POA: Diagnosis not present

## 2023-12-06 DIAGNOSIS — R2689 Other abnormalities of gait and mobility: Secondary | ICD-10-CM | POA: Diagnosis not present

## 2023-12-06 DIAGNOSIS — M6281 Muscle weakness (generalized): Secondary | ICD-10-CM | POA: Diagnosis not present

## 2023-12-06 DIAGNOSIS — M5459 Other low back pain: Secondary | ICD-10-CM | POA: Diagnosis not present
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
## 2023-12-09 ENCOUNTER — Ambulatory Visit (HOSPITAL_BASED_OUTPATIENT_CLINIC_OR_DEPARTMENT_OTHER): Payer: Medicare PPO

## 2023-12-09 DIAGNOSIS — R600 Localized edema: Secondary | ICD-10-CM | POA: Diagnosis not present

## 2023-12-09 DIAGNOSIS — R0602 Shortness of breath: Secondary | ICD-10-CM | POA: Diagnosis not present

## 2023-12-09 NOTE — Progress Notes (Unsigned)
Cardiology Office Note    Date:  12/09/2023  ID:  Benjamin Fuller, Benjamin Fuller March 21, 1941, MRN [id number] PCP:  Benjamin Pilon, FNP  Cardiologist:  Benjamin Red, MD  Electrophysiologist:  Benjamin Range, MD (Inactive)   Chief Complaint: ***  History of Present Illness: .    Benjamin Fuller is a 82 y.o. male with visit-pertinent history of  type 2 diabetes, hypertension, hyperlipidemia, paroxysmal atrial fibrillation, symptomatic bradycardia s/p PPM in 11/2021.   First seen in 01/2020 by Dr. Cristal Fuller for further evaluation and management of atrial fibrillation. He had an atrial fibrillation ablation in 2022 with Dr. Johney Fuller. On 12/21/2021 he underwent successful implantation of a [employer] dual-chamber pacemaker for symptomatic bradycardia due to complete heart block.  At his visit in 03/2023 he was doing well with stable shortness of breath.  He denied arrhythmias or fluid retention.  On 07/03/2023 he was seen by Dr. Tenny Fuller and found to be in atrial fibrillation.  On 07/09/2023 he underwent DCCV, when seen in follow-up he was feeling well and maintaining sinus rhythm.  He was last seen in clinic in 09/2023 by Dr. Cristal Fuller, he remained stable from a cardiac perspective and his blood pressure was well-controlled.  It was noted that when he saw Dr. Tenny Fuller in July he was found to be retaining fluid, he had noted his feet were feeling tight at the end of the day and had noted weight gain.   Benjamin Fuller was admitted from 10/02/2023 through 10/07/2023 for sepsis secondary to community-acquired pneumonia.  He presented with fever, tachycardia and tachypnea.  Blood cultures were negative.  He was treated with IV ceftriaxone and azithromycin as well as IV fluids, was also started on prednisone.  He was discharged on 10/07/2023 in stable condition.  On 11/15/2023 he presented for lower extremity edema and fatigue.  He felt that he had overall been improving since his discharge although he noted increased  shortness of breath and lower extremity edema.  On interrogation of his pacemaker it was determined that he was in atrial fibrillation.  He followed up in the A-fib clinic on 11/22/2023 it was determined that he would undergo DCCV.  On 11/27/2023 he underwent cardioversion with conversion to AV paced, sinus rhythm.  Today he presents for follow-up.  He reports that he  Lower extremity edema/dyspnea on exertion: At last office visit patient reported increased shortness of breath and lower extremity edema, he was found to be in persistent atrial fibrillation after pacemaker interrogation.   On exam he has +1 bilateral edema up to mid shin, he otherwise appears euvolemic and well compensated. Check echocardiogram.   Will have him take Lasix 40 mg daily for 3 days, instructed if his blood pressure was less than 120/60 to hold his ramipril with increased Lasix.    Paroxysmal atrial fibrillation: Hx of afib ablation in 2022 with Dr. Johney Fuller. S/p DCCV in July, 2024.  At last office visit on 11/15 pacemaker interrogation showed he was in atrial fibrillation.  Underwent cardioversion on 11/27/2023.    He denies bleeding problems on Eliquis.  Continue Eliquis 5 mg twice daily.    History of CHB: s/p PPM in 2022.    Hypertension: Blood pressure today on recheck was   Hyperlipidemia: Primary care plans to recheck lipid profile in December. On simvastatin 40 mg daily.    Type II diabetes: Last hemoglobin A1c 7.1 on 10/07/2023.  Monitored and managed per PCP.      Labwork independently reviewed: 11/15/2023: Sodium  139, potassium 4.8, creatinine 0.85, hemoglobin 11.9, hematocrit 37.5  ROS: .   *** denies chest pain, shortness of breath, lower extremity edema, fatigue, palpitations, melena, hematuria, hemoptysis, diaphoresis, weakness, presyncope, syncope, orthopnea, and PND.  All other systems are reviewed and otherwise negative.  Studies Reviewed: Marland Kitchen    EKG:  EKG is ordered today, personally reviewed,  demonstrating ***     CV Studies:  Cardiac Studies & Procedures       ECHOCARDIOGRAM  ECHOCARDIOGRAM COMPLETE 03/02/2021  Narrative ECHOCARDIOGRAM REPORT    Patient Name:   Benjamin Fuller Date of Exam: 03/02/2021 Medical Rec #:  [id number]       Height:       66.0 in Accession #:    [id number]      Weight:       175.8 lb Date of Birth:  1941/01/12       BSA:          1.893 m Patient Age:    79 years        BP:           132/69 mmHg Patient Gender: M               HR:           78 bpm. Exam Location:  [address]  Procedure: 2D Echo, Cardiac Doppler and Color Doppler  Indications:    R06.00 SOB  History:        Patient has prior history of Echocardiogram examinations, most recent 01/14/2020. Arrythmias:Atrial Fibrillation; Risk Factors:Hypertension, Diabetes and HLD.  Sonographer:    Clearence Ped RCS Referring Phys: [phone] BRIDGETTE CHRISTOPHER  IMPRESSIONS   1. Left ventricular ejection fraction, by estimation, is 60 to 65%. The left ventricle has normal function. The left ventricle has no regional wall motion abnormalities. There is mild concentric left ventricular hypertrophy. Left ventricular diastolic function could not be evaluated. Elevated left ventricular end-diastolic pressure. 2. Right ventricular systolic function is normal. The right ventricular size is normal. There is normal pulmonary artery systolic pressure. The estimated right ventricular systolic pressure is 28.0 mmHg. 3. The mitral valve is normal in structure. Mild mitral valve regurgitation. No evidence of mitral stenosis. 4. The aortic valve is normal in structure. Aortic valve regurgitation is not visualized. No aortic stenosis is present. 5. Aortic dilatation noted. There is mild dilatation of the aortic root, measuring 38 mm. 6. The inferior vena cava is normal in size with greater than 50% respiratory variability, suggesting right atrial pressure of 3 mmHg.  FINDINGS Left Ventricle: Left  ventricular ejection fraction, by estimation, is 60 to 65%. The left ventricle has normal function. The left ventricle has no regional wall motion abnormalities. The left ventricular internal cavity size was normal in size. There is mild concentric left ventricular hypertrophy. Left ventricular diastolic function could not be evaluated due to atrial fibrillation. Left ventricular diastolic function could not be evaluated. Elevated left ventricular end-diastolic pressure.  Right Ventricle: The right ventricular size is normal. No increase in right ventricular wall thickness. Right ventricular systolic function is normal. There is normal pulmonary artery systolic pressure. The tricuspid regurgitant velocity is 2.50 m/s, and with an assumed right atrial pressure of 3 mmHg, the estimated right ventricular systolic pressure is 28.0 mmHg.  Left Atrium: Left atrial size was normal in size.  Right Atrium: Right atrial size was normal in size.  Pericardium: There is no evidence of pericardial effusion.  Mitral Valve: The mitral valve is normal in  structure. Mild mitral valve regurgitation. No evidence of mitral valve stenosis.  Tricuspid Valve: The tricuspid valve is normal in structure. Tricuspid valve regurgitation is mild . No evidence of tricuspid stenosis.  Aortic Valve: The aortic valve is normal in structure. Aortic valve regurgitation is not visualized. No aortic stenosis is present.  Pulmonic Valve: The pulmonic valve was normal in structure. Pulmonic valve regurgitation is trivial. No evidence of pulmonic stenosis.  Aorta: Aortic dilatation noted. There is mild dilatation of the aortic root, measuring 38 mm.  Venous: The inferior vena cava is normal in size with greater than 50% respiratory variability, suggesting right atrial pressure of 3 mmHg.  IAS/Shunts: No atrial level shunt detected by color flow Doppler.   LEFT VENTRICLE PLAX 2D LVIDd:         3.10 cm  Diastology LVIDs:          2.30 cm  LV e' medial:    6.09 cm/s LV PW:         1.20 cm  LV E/e' medial:  18.4 LV IVS:        1.20 cm  LV e' lateral:   10.30 cm/s LVOT diam:     2.10 cm  LV E/e' lateral: 10.9 LV SV:         55 LV SV Index:   29 LVOT Area:     3.46 cm   RIGHT VENTRICLE RV Basal diam:  3.00 cm RV S prime:     12.00 cm/s TAPSE (M-mode): 1.4 cm RVSP:           28.0 mmHg  LEFT ATRIUM             Index       RIGHT ATRIUM           Index LA diam:        3.90 cm 2.06 cm/m  RA Pressure: 3.00 mmHg LA Vol (A2C):   42.6 ml 22.50 ml/m RA Area:     13.40 cm LA Vol (A4C):   32.8 ml 17.33 ml/m RA Volume:   29.80 ml  15.74 ml/m LA Biplane Vol: 37.4 ml 19.76 ml/m AORTIC VALVE LVOT Vmax:   72.67 cm/s LVOT Vmean:  52.033 cm/s LVOT VTI:    0.160 m  AORTA Ao Root diam: 3.80 cm Ao Asc diam:  3.60 cm  MITRAL VALVE                 TRICUSPID VALVE MV Area (PHT):               TR Peak grad:   25.0 mmHg MV Decel Time:               TR Vmax:        250.00 cm/s MR Peak grad:    69.9 mmHg   Estimated RAP:  3.00 mmHg MR Mean grad:    46.0 mmHg   RVSP:           28.0 mmHg MR Vmax:         418.00 cm/s MR Vmean:        318.0 cm/s  SHUNTS MR PISA:         1.27 cm    Systemic VTI:  0.16 m MR PISA Eff ROA: 9 mm       Systemic Diam: 2.10 cm MR PISA Radius:  0.45 cm MV E velocity: 112.00 cm/s  Armanda Magic MD Electronically signed by Armanda Magic MD Signature Date/Time: 03/02/2021/2:10:44 PM  Final               Current Reported Medications:.    No outpatient medications have been marked as taking for the 12/10/23 encounter (Appointment) with Rip Harbour, NP.    Physical Exam:    VS:  There were no vitals taken for this visit.   Wt Readings from Last 3 Encounters:  11/22/23 170 lb 9.6 oz (77.4 kg)  11/15/23 175 lb (79.4 kg)  10/02/23 168 lb (76.2 kg)    GEN: Well nourished, well developed in no acute distress NECK: No JVD; No carotid bruits CARDIAC: ***RRR, no murmurs, rubs,  gallops RESPIRATORY:  Clear to auscultation without rales, wheezing or rhonchi  ABDOMEN: Soft, non-tender, non-distended EXTREMITIES:  No edema; No acute deformity   Asessement and Plan:.     ***     Disposition: F/u with ***  Signed, Rip Harbour, NP

## 2023-12-10 ENCOUNTER — Ambulatory Visit: Payer: Medicare PPO | Attending: Cardiology | Admitting: Cardiology

## 2023-12-10 ENCOUNTER — Ambulatory Visit: Payer: Medicare PPO | Admitting: Physical Therapy

## 2023-12-10 ENCOUNTER — Encounter: Payer: Self-pay | Admitting: Cardiology

## 2023-12-10 VITALS — BP 118/52 | HR 78 | Ht 65.0 in | Wt 169.0 lb

## 2023-12-10 DIAGNOSIS — M5459 Other low back pain: Secondary | ICD-10-CM | POA: Diagnosis not present

## 2023-12-10 DIAGNOSIS — I10 Essential (primary) hypertension: Secondary | ICD-10-CM

## 2023-12-10 DIAGNOSIS — E782 Mixed hyperlipidemia: Secondary | ICD-10-CM | POA: Diagnosis not present

## 2023-12-10 DIAGNOSIS — M545 Low back pain, unspecified: Secondary | ICD-10-CM

## 2023-12-10 DIAGNOSIS — R600 Localized edema: Secondary | ICD-10-CM | POA: Diagnosis not present

## 2023-12-10 DIAGNOSIS — R2689 Other abnormalities of gait and mobility: Secondary | ICD-10-CM

## 2023-12-10 DIAGNOSIS — I4819 Other persistent atrial fibrillation: Secondary | ICD-10-CM | POA: Diagnosis not present

## 2023-12-10 DIAGNOSIS — Z950 Presence of cardiac pacemaker: Secondary | ICD-10-CM | POA: Diagnosis not present

## 2023-12-10 DIAGNOSIS — R293 Abnormal posture: Secondary | ICD-10-CM

## 2023-12-10 DIAGNOSIS — D6869 Other thrombophilia: Secondary | ICD-10-CM

## 2023-12-10 DIAGNOSIS — M6281 Muscle weakness (generalized): Secondary | ICD-10-CM | POA: Diagnosis not present

## 2023-12-10 DIAGNOSIS — R0609 Other forms of dyspnea: Secondary | ICD-10-CM

## 2023-12-10 DIAGNOSIS — R0602 Shortness of breath: Secondary | ICD-10-CM | POA: Diagnosis not present

## 2023-12-10 LAB — ECHOCARDIOGRAM COMPLETE
Area-P 1/2: 4.21 cm2
MV M vel: 4.05 m/s
MV Peak grad: 65.6 mm[Hg]
S' Lateral: 2.66 cm

## 2023-12-10 NOTE — Patient Instructions (Addendum)
Medication Instructions:  No changes *If you need a refill on your cardiac medications before your next appointment, please call your pharmacy*  Lab Work: No labs  Testing/Procedures: No testing  Follow-Up: At [facility], you and your health needs are our priority.  As part of our continuing mission to provide you with exceptional heart care, we have created designated Provider Care Teams.  These Care Teams include your primary Cardiologist (physician) and Advanced Practice Providers (APPs -  Physician Assistants and Nurse Practitioners) who all work together to provide you with the care you need, when you need it.  We recommend signing up for the patient portal called "MyChart".  Sign up information is provided on this After Visit Summary.  MyChart is used to connect with patients for Virtual Visits (Telemedicine).  Patients are able to view lab/test results, encounter notes, upcoming appointments, etc.  Non-urgent messages can be sent to your provider as well.   To learn more about what you can do with MyChart, go to [email or web address].    Your next appointment:   3-4 month(s)  Provider:   Jodelle Red, MD
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
## 2023-12-11 NOTE — Progress Notes (Unsigned)
Cardiology Office Note:  .   Date:  12/11/2023  ID:  Benjamin Fuller, DOB 12/03/1941, MRN [id number] PCP: Soundra Pilon, FNP  [facility] Providers Cardiologist:  Jodelle Red, MD Electrophysiologist:  Hillis Range, MD (Inactive) >> Dr. Ladona Ridgel  History of Present Illness: .   Benjamin Fuller is a 82 y.o. male w/PMHx of AFib, CHB w/PPM, DM,  HTN, HLD, LBBB  He saw Dr. Ladona Ridgel 03/27/22, doing well, maintaining SR, no changes were made.  admitted from 10/02/2023 through 10/07/2023 for sepsis secondary to community-acquired pneumonia.  He presented with fever, tachycardia and tachypnea.  Blood cultures were negative.  He was treated with IV ceftriaxone and azithromycin as well as IV fluids, was also started on prednisone.  He was discharged on 10/07/2023 in stable condition.   Following regularly with cardiology team. Last was with K. Shelva Majestic, NP on 11/15/23, using PRN lasix, reporting edema, SOB, had a slow healing wound LLE Device  remote noted persistent AFib  post DCCV July 2024 > planned to see the AFib clinic BNP was elevated and advised on how to use PRN lasix  Saw the Afib clnic 11/22/23, planned for DCCV  DCCV > AV paced on 11/27/23  Saw cards team again on 12/10/23, feeling better, SOB resolved, planned to start silver sneakers at he new year.  Volume stable, no changes were made   Today's visit is scheduled as an annual visit  ROS:   He continues to f eel quite well Thinks much of his terrible fatigue was the AFib, he thought he was really slow to recover from his hospitalization He reports good medication compliance No bleeding or signs of bleeding No CP, palpitations, cardiac awareness No near syncope or syncope, no dizzy spells Has not had to use PRN lasix since his DCCV   Device information BSCi dual chamber PPM implanted 112/22/22  Arrhythmia/AAD hx Diagnosed Jan 2021 PVI ablation 03/30/21  No AAD to date  Studies Reviewed: Marland Kitchen    EKG not done  today  DEVICE interrogation done today and reviewed by myself Battery and lead measurements are good 2 very brief AFlutter episodes (seconds) since his DCCV PMT episodes appear false AP 2% VP 100% dependent  03/30/2021: EPS, ablation CONCLUSIONS: 1. Atrial fibrillation upon presentation.   2. Intracardiac echo reveals a moderate sized left atrium with four separate pulmonary veins without evidence of pulmonary vein stenosis. 3. Successful electrical isolation and anatomical encircling of all four pulmonary veins with radiofrequency current.  A WACA approach was used 3. Additional left atrial ablation was performed with a standard box lesion created along the posterior wall of the left atrium 4. Atrial fibrillation successfully cardioverted to sinus rhythm. 5. No early apparent complications.   03/02/2021: TTE 1. Left ventricular ejection fraction, by estimation, is 60 to 65%. The  left ventricle has normal function. The left ventricle has no regional  wall motion abnormalities. There is mild concentric left ventricular  hypertrophy. Left ventricular diastolic  function could not be evaluated. Elevated left ventricular end-diastolic  pressure.   2. Right ventricular systolic function is normal. The right ventricular  size is normal. There is normal pulmonary artery systolic pressure. The  estimated right ventricular systolic pressure is 28.0 mmHg.   3. The mitral valve is normal in structure. Mild mitral valve  regurgitation. No evidence of mitral stenosis.   4. The aortic valve is normal in structure. Aortic valve regurgitation is  not visualized. No aortic stenosis is present.   5.  Aortic dilatation noted. There is mild dilatation of the aortic root,  measuring 38 mm.   6. The inferior vena cava is normal in size with greater than 50%  respiratory variability, suggesting right atrial pressure of 3 mmHg.    Risk Assessment/Calculations:    Physical Exam:   VS:  There were no  vitals taken for this visit.   Wt Readings from Last 3 Encounters:  12/10/23 169 lb (76.7 kg)  11/22/23 170 lb 9.6 oz (77.4 kg)  11/15/23 175 lb (79.4 kg)    GEN: Well nourished, well developed in no acute distress NECK: No JVD; No carotid bruits CARDIAC: RRR, no murmurs, rubs, gallops RESPIRATORY:   CTA b/l without rales, wheezing or rhonchi  ABDOMEN: Soft, non-tender, non-distended EXTREMITIES:  No edema; No deformity   PPM site: is stable, no thinning, fluctuation, tethering  ASSESSMENT AND PLAN: .    persistent AFib CHA2DS2Vasc is 5, on Eliquis, appropriately dosed for Creat/weight Very low (near zero) burden since his DCCV  This his 2nd AFib episode this year, and likely triggered by his sepsis. If burden increased would consider AAD  PPM Intact function No programming changes made  Chronic CHF (presumed HFpEF) Being treated clinically for HF Euvolemic today C/w Dr. Melissa Noon  HTN Looks good No changes today  Secondary hypercoagulable state 2/2 AFib   Dispo: remotes as usual, sees Dr. Cristal Deer in March, will have him see EP in clinic in 33mo, sooner if needed  Signed, Sheilah Pigeon, PA-C

## 2023-12-12 ENCOUNTER — Telehealth: Payer: Self-pay

## 2023-12-12 ENCOUNTER — Ambulatory Visit: Payer: Medicare PPO | Admitting: Physical Therapy

## 2023-12-12 DIAGNOSIS — R293 Abnormal posture: Secondary | ICD-10-CM

## 2023-12-12 DIAGNOSIS — M545 Low back pain, unspecified: Secondary | ICD-10-CM | POA: Diagnosis not present

## 2023-12-12 DIAGNOSIS — M5459 Other low back pain: Secondary | ICD-10-CM | POA: Diagnosis not present

## 2023-12-12 DIAGNOSIS — M6281 Muscle weakness (generalized): Secondary | ICD-10-CM | POA: Diagnosis not present

## 2023-12-12 DIAGNOSIS — R0609 Other forms of dyspnea: Secondary | ICD-10-CM | POA: Diagnosis not present

## 2023-12-12 DIAGNOSIS — R2689 Other abnormalities of gait and mobility: Secondary | ICD-10-CM

## 2023-12-12 NOTE — Telephone Encounter (Signed)
Called patient advised of below they verbalized understanding.

## 2023-12-12 NOTE — Telephone Encounter (Signed)
-----   Message from [facility] sent at 12/11/2023  9:42 PM EST ----- Please call and let [name] know that his heart squeeze is normal with a EF of 55 to 60% there is some mild thickening and stiffening of the left lower chamber of the heart.  There is minor leaking of the mitral valve. There is some minor enlargement or dilation of the aorta that we will monitor in the future. Overall no significant change from prior study, I would recommend he at least discuss discontinuing Actos with his PCP given the stiffening of the left lower chamber of the heart.
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
## 2023-12-13 DIAGNOSIS — M791 Myalgia, unspecified site: Secondary | ICD-10-CM | POA: Diagnosis not present

## 2023-12-13 DIAGNOSIS — Z0000 Encounter for general adult medical examination without abnormal findings: Secondary | ICD-10-CM | POA: Diagnosis not present

## 2023-12-13 DIAGNOSIS — N400 Enlarged prostate without lower urinary tract symptoms: Secondary | ICD-10-CM | POA: Diagnosis not present

## 2023-12-13 DIAGNOSIS — I480 Paroxysmal atrial fibrillation: Secondary | ICD-10-CM | POA: Diagnosis not present

## 2023-12-13 DIAGNOSIS — Z125 Encounter for screening for malignant neoplasm of prostate: Secondary | ICD-10-CM | POA: Diagnosis not present

## 2023-12-13 DIAGNOSIS — Z6827 Body mass index (BMI) 27.0-27.9, adult: Secondary | ICD-10-CM | POA: Diagnosis not present

## 2023-12-13 DIAGNOSIS — E1169 Type 2 diabetes mellitus with other specified complication: Secondary | ICD-10-CM | POA: Diagnosis not present

## 2023-12-13 DIAGNOSIS — E782 Mixed hyperlipidemia: Secondary | ICD-10-CM | POA: Diagnosis not present

## 2023-12-13 DIAGNOSIS — D6869 Other thrombophilia: Secondary | ICD-10-CM | POA: Diagnosis not present

## 2023-12-13 DIAGNOSIS — I10 Essential (primary) hypertension: Secondary | ICD-10-CM | POA: Diagnosis not present

## 2023-12-13 DIAGNOSIS — I700 Atherosclerosis of aorta: Secondary | ICD-10-CM | POA: Diagnosis not present

## 2023-12-13 DIAGNOSIS — K760 Fatty (change of) liver, not elsewhere classified: Secondary | ICD-10-CM | POA: Diagnosis not present

## 2023-12-13 DIAGNOSIS — E538 Deficiency of other specified B group vitamins: Secondary | ICD-10-CM | POA: Diagnosis not present

## 2023-12-13 DIAGNOSIS — E119 Type 2 diabetes mellitus without complications: Secondary | ICD-10-CM | POA: Diagnosis not present

## 2023-12-16 ENCOUNTER — Ambulatory Visit: Payer: Medicare PPO | Attending: Physician Assistant | Admitting: Physician Assistant

## 2023-12-16 ENCOUNTER — Encounter: Payer: Self-pay | Admitting: Physician Assistant

## 2023-12-16 VITALS — BP 121/62 | HR 86 | Ht 65.0 in | Wt 164.8 lb

## 2023-12-16 DIAGNOSIS — I10 Essential (primary) hypertension: Secondary | ICD-10-CM

## 2023-12-16 DIAGNOSIS — I4819 Other persistent atrial fibrillation: Secondary | ICD-10-CM

## 2023-12-16 DIAGNOSIS — I5032 Chronic diastolic (congestive) heart failure: Secondary | ICD-10-CM

## 2023-12-16 DIAGNOSIS — D6869 Other thrombophilia: Secondary | ICD-10-CM | POA: Diagnosis not present

## 2023-12-16 DIAGNOSIS — Z950 Presence of cardiac pacemaker: Secondary | ICD-10-CM

## 2023-12-16 NOTE — Patient Instructions (Signed)
Medication Instructions:   Your physician recommends that you continue on your current medications as directed. Please refer to the Current Medication list given to you today.   *If you need a refill on your cardiac medications before your next appointment, please call your pharmacy*   Lab Work: [place]   If you have labs (blood work) drawn today and your tests are completely normal, you will receive your results only by: [place] (if you have MyChart) OR A paper copy in the mail If you have any lab test that is abnormal or we need to change your treatment, we will call you to review the results.   Testing/Procedures: NONE ORDERED  TODAY     Follow-Up: At [facility], you and your health needs are our priority.  As part of our continuing mission to provide you with exceptional heart care, we have created designated Provider Care Teams.  These Care Teams include your primary Cardiologist (physician) and Advanced Practice Providers (APPs -  Physician Assistants and Nurse Practitioners) who all work together to provide you with the care you need, when you need it.  We recommend signing up for the patient portal called "MyChart".  Sign up information is provided on this After Visit Summary.  MyChart is used to connect with patients for Virtual Visits (Telemedicine).  Patients are able to view lab/test results, encounter notes, upcoming appointments, etc.  Non-urgent messages can be sent to your provider as well.   To learn more about what you can do with MyChart, go to [email or web address].    Your next appointment:   6 month(s)   Provider:   You may see Dr. Lovena Le  or one of the following Advanced Practice Providers on your designated Care Team:   Tommye Standard, [place]   Other Instructions

## 2023-12-17 ENCOUNTER — Ambulatory Visit: Payer: Medicare PPO | Admitting: Physical Therapy

## 2023-12-17 DIAGNOSIS — R2689 Other abnormalities of gait and mobility: Secondary | ICD-10-CM

## 2023-12-17 DIAGNOSIS — M5459 Other low back pain: Secondary | ICD-10-CM

## 2023-12-17 DIAGNOSIS — M6281 Muscle weakness (generalized): Secondary | ICD-10-CM

## 2023-12-17 DIAGNOSIS — M545 Low back pain, unspecified: Secondary | ICD-10-CM

## 2023-12-17 DIAGNOSIS — R0609 Other forms of dyspnea: Secondary | ICD-10-CM | POA: Diagnosis not present

## 2023-12-17 DIAGNOSIS — R293 Abnormal posture: Secondary | ICD-10-CM
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
## 2023-12-19 ENCOUNTER — Ambulatory Visit: Payer: Medicare PPO | Admitting: Physical Therapy

## 2023-12-19 DIAGNOSIS — R293 Abnormal posture: Secondary | ICD-10-CM | POA: Diagnosis not present

## 2023-12-19 DIAGNOSIS — M5459 Other low back pain: Secondary | ICD-10-CM | POA: Diagnosis not present

## 2023-12-19 DIAGNOSIS — M6281 Muscle weakness (generalized): Secondary | ICD-10-CM | POA: Diagnosis not present

## 2023-12-19 DIAGNOSIS — R2689 Other abnormalities of gait and mobility: Secondary | ICD-10-CM | POA: Diagnosis not present

## 2023-12-19 DIAGNOSIS — R0609 Other forms of dyspnea: Secondary | ICD-10-CM

## 2023-12-19 DIAGNOSIS — M545 Low back pain, unspecified: Secondary | ICD-10-CM | POA: Diagnosis not present
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
## 2023-12-23 ENCOUNTER — Ambulatory Visit: Payer: Medicare PPO | Admitting: Physical Therapy

## 2023-12-23 ENCOUNTER — Encounter: Payer: Self-pay | Admitting: Physical Therapy

## 2023-12-23 DIAGNOSIS — R293 Abnormal posture: Secondary | ICD-10-CM

## 2023-12-23 DIAGNOSIS — M6281 Muscle weakness (generalized): Secondary | ICD-10-CM

## 2023-12-23 DIAGNOSIS — R2689 Other abnormalities of gait and mobility: Secondary | ICD-10-CM

## 2023-12-23 DIAGNOSIS — M5459 Other low back pain: Secondary | ICD-10-CM | POA: Diagnosis not present

## 2023-12-23 DIAGNOSIS — M545 Low back pain, unspecified: Secondary | ICD-10-CM | POA: Diagnosis not present

## 2023-12-23 DIAGNOSIS — R0609 Other forms of dyspnea: Secondary | ICD-10-CM | POA: Diagnosis not present
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
## 2023-12-30 ENCOUNTER — Ambulatory Visit: Payer: Medicare PPO | Admitting: Physical Therapy

## 2023-12-30 ENCOUNTER — Encounter: Payer: Self-pay | Admitting: Physical Therapy

## 2023-12-30 DIAGNOSIS — R2689 Other abnormalities of gait and mobility: Secondary | ICD-10-CM

## 2023-12-30 DIAGNOSIS — R293 Abnormal posture: Secondary | ICD-10-CM | POA: Diagnosis not present

## 2023-12-30 DIAGNOSIS — R0609 Other forms of dyspnea: Secondary | ICD-10-CM | POA: Diagnosis not present

## 2023-12-30 DIAGNOSIS — M5459 Other low back pain: Secondary | ICD-10-CM | POA: Diagnosis not present

## 2023-12-30 DIAGNOSIS — M545 Low back pain, unspecified: Secondary | ICD-10-CM | POA: Diagnosis not present

## 2023-12-30 DIAGNOSIS — M6281 Muscle weakness (generalized): Secondary | ICD-10-CM | POA: Diagnosis not present
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
## 2024-01-10 ENCOUNTER — Ambulatory Visit (INDEPENDENT_AMBULATORY_CARE_PROVIDER_SITE_OTHER): Payer: Medicare PPO

## 2024-01-10 DIAGNOSIS — I442 Atrioventricular block, complete: Secondary | ICD-10-CM

## 2024-01-10 LAB — CUP PACEART REMOTE DEVICE CHECK
Battery Remaining Longevity: 132 mo
Battery Remaining Percentage: 100 %
Brady Statistic RA Percent Paced: 3 %
Brady Statistic RV Percent Paced: 100 %
Date Time Interrogation Session: 20250110024300
Implantable Lead Connection Status: 753985
Implantable Lead Connection Status: 753985
Implantable Lead Implant Date: 20221222
Implantable Lead Implant Date: 20221222
Implantable Lead Location: 753859
Implantable Lead Location: 753860
Implantable Lead Model: 7841
Implantable Lead Model: 7842
Implantable Lead Serial Number: 1102393
Implantable Lead Serial Number: 1161013
Implantable Pulse Generator Implant Date: 20221222
Lead Channel Impedance Value: 679 Ohm
Lead Channel Impedance Value: 710 Ohm
Lead Channel Pacing Threshold Amplitude: 0.4 V
Lead Channel Pacing Threshold Pulse Width: 0.4 ms
Lead Channel Setting Pacing Amplitude: 2.5 V
Lead Channel Setting Pacing Amplitude: 2.5 V
Lead Channel Setting Pacing Pulse Width: 0.4 ms
Lead Channel Setting Sensing Sensitivity: 2.5 mV
Pulse Gen Serial Number: 562911
Zone Setting Status: 755011

## 2024-01-16 ENCOUNTER — Telehealth: Payer: Self-pay

## 2024-01-16 NOTE — Telephone Encounter (Signed)
Alert received from [employer] for Ongoing AF for the  past 24 hours. Had a successful DCCV on 11/27/2023, on OAC, good ventricular rate control. Sent to triage due to back in atrial arrhythmia post recent DCCV.   Routing to AF clinc PA who ordered DCCV to review.

## 2024-01-20 NOTE — Telephone Encounter (Signed)
Per review of [facility]-Pt continues in rate controlled afib.  Left message requesting call back to assess for symptoms.

## 2024-01-21 NOTE — Telephone Encounter (Signed)
Transmission received.

## 2024-01-21 NOTE — Telephone Encounter (Signed)
Returned call to Pt.  Advised he had went back into afib on January 13, 2024.  As of Janaury 18, 2025 Pt continued in afib.  He denies any symptoms.  He will send another transmission today to see if he continues in afib.  Will follow up once transmission received.

## 2024-01-28 ENCOUNTER — Encounter (HOSPITAL_COMMUNITY): Payer: Self-pay | Admitting: Internal Medicine

## 2024-01-28 ENCOUNTER — Ambulatory Visit (HOSPITAL_COMMUNITY)
Admission: RE | Admit: 2024-01-28 | Discharge: 2024-01-28 | Disposition: A | Discharge status: Home / Self Care | Payer: Medicare PPO | Source: Ambulatory Visit | Attending: Internal Medicine | Admitting: Internal Medicine

## 2024-01-28 VITALS — BP 136/64 | HR 79 | Ht 65.0 in | Wt 172.6 lb

## 2024-01-28 DIAGNOSIS — I10 Essential (primary) hypertension: Secondary | ICD-10-CM | POA: Insufficient documentation

## 2024-01-28 DIAGNOSIS — D6869 Other thrombophilia: Secondary | ICD-10-CM | POA: Diagnosis not present

## 2024-01-28 DIAGNOSIS — I4891 Unspecified atrial fibrillation: Secondary | ICD-10-CM | POA: Diagnosis present

## 2024-01-28 DIAGNOSIS — R9431 Abnormal electrocardiogram [ECG] [EKG]: Secondary | ICD-10-CM | POA: Diagnosis not present

## 2024-01-28 DIAGNOSIS — Z7901 Long term (current) use of anticoagulants: Secondary | ICD-10-CM | POA: Insufficient documentation

## 2024-01-28 DIAGNOSIS — E785 Hyperlipidemia, unspecified: Secondary | ICD-10-CM | POA: Insufficient documentation

## 2024-01-28 DIAGNOSIS — Z7984 Long term (current) use of oral hypoglycemic drugs: Secondary | ICD-10-CM | POA: Insufficient documentation

## 2024-01-28 DIAGNOSIS — E119 Type 2 diabetes mellitus without complications: Secondary | ICD-10-CM | POA: Insufficient documentation

## 2024-01-28 DIAGNOSIS — I4819 Other persistent atrial fibrillation: Secondary | ICD-10-CM | POA: Insufficient documentation

## 2024-01-28 MED ORDER — AMIODARONE HCL 200 MG PO TABS: ORAL_TABLET | ORAL | 2 refills | Status: DC
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
## 2024-01-28 NOTE — Patient Instructions (Signed)
Start Amiodarone 200mg - Take 1 tablet by mouth twice daily x 1 month and then will decrease to 1 tablet daily

## 2024-02-14 NOTE — Progress Notes (Signed)
Remote pacemaker transmission.

## 2024-02-18 ENCOUNTER — Ambulatory Visit (HOSPITAL_COMMUNITY)
Admission: RE | Admit: 2024-02-18 | Discharge: 2024-02-18 | Disposition: A | Discharge status: Home / Self Care | Payer: Medicare PPO | Source: Ambulatory Visit | Attending: Internal Medicine | Admitting: Internal Medicine

## 2024-02-18 VITALS — BP 124/58 | HR 80 | Ht 65.0 in | Wt 170.6 lb

## 2024-02-18 DIAGNOSIS — I4891 Unspecified atrial fibrillation: Secondary | ICD-10-CM

## 2024-02-18 DIAGNOSIS — Z7901 Long term (current) use of anticoagulants: Secondary | ICD-10-CM | POA: Diagnosis not present

## 2024-02-18 DIAGNOSIS — I10 Essential (primary) hypertension: Secondary | ICD-10-CM | POA: Diagnosis not present

## 2024-02-18 DIAGNOSIS — E785 Hyperlipidemia, unspecified: Secondary | ICD-10-CM | POA: Diagnosis not present

## 2024-02-18 DIAGNOSIS — I4819 Other persistent atrial fibrillation: Secondary | ICD-10-CM | POA: Insufficient documentation

## 2024-02-18 DIAGNOSIS — Z7984 Long term (current) use of oral hypoglycemic drugs: Secondary | ICD-10-CM | POA: Diagnosis not present

## 2024-02-18 DIAGNOSIS — E119 Type 2 diabetes mellitus without complications: Secondary | ICD-10-CM | POA: Insufficient documentation

## 2024-02-18 DIAGNOSIS — Z79899 Other long term (current) drug therapy: Secondary | ICD-10-CM | POA: Insufficient documentation

## 2024-02-18 DIAGNOSIS — D6869 Other thrombophilia: Secondary | ICD-10-CM | POA: Insufficient documentation

## 2024-02-18 LAB — BASIC METABOLIC PANEL
Anion gap: 11 (ref 5–15)
BUN: 14 mg/dL (ref 8–23)
CO2: 23 mmol/L (ref 22–32)
Calcium: 9.3 mg/dL (ref 8.9–10.3)
Chloride: 104 mmol/L (ref 98–111)
Creatinine, Ser: 1.03 mg/dL (ref 0.61–1.24)
GFR, Estimated: >60 mL/min (ref >60)
Glucose, Bld: 271 mg/dL — ABNORMAL HIGH (ref 70–99)
Potassium: 4.5 mmol/L (ref 3.5–5.1)
Sodium: 138 mmol/L (ref 135–145)

## 2024-02-18 LAB — CBC
HCT: 44.5 % (ref 39.0–52.0)
Hemoglobin: 14.9 g/dL (ref 13.0–17.0)
MCH: 30.3 pg (ref 26.0–34.0)
MCHC: 33.5 g/dL (ref 30.0–36.0)
MCV: 90.6 fL (ref 80.0–100.0)
Platelets: 162 10*3/uL (ref 150–400)
RBC: 4.91 MIL/uL (ref 4.22–5.81)
RDW: 12.5 % (ref 11.5–15.5)
WBC: 5.3 10*3/uL (ref 4.0–10.5)
nRBC: 0 % (ref 0.0–0.2)

## 2024-02-18 NOTE — Patient Instructions (Addendum)
Cardioversion scheduled for: Monday, February 24th   - Arrive at the [facility] and go to admitting at 7:15am   - Do not eat or drink anything after midnight the night prior to your procedure.   - Take all your morning medication (except diabetic medications) with a sip of water prior to arrival.  - You will not be able to drive home after your procedure.    - Do NOT miss any doses of your blood thinner - if you should miss a dose please notify our office immediately.   - If you feel as if you go back into normal rhythm prior to scheduled cardioversion, please notify our office immediately.   If your procedure is canceled in the cardioversion suite you will be charged a cancellation fee.    Hold below medications 72 hours prior to scheduled procedure/anesthesia. Restart medication on the following day after scheduled procedure/anesthesia  Empagliflozin (Jardiance)       For those patients who have a scheduled procedure/anesthesia on the same day of the week as their dose, hold the medication on the day of surgery.  They can take their scheduled dose the week before.  **Patients on the above medications scheduled for elective procedures that have not held the medication for the appropriate amount of time are at risk of cancellation or change in the anesthetic plan.
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
## 2024-02-21 NOTE — Progress Notes (Signed)
 Spoke to pt and instructed them to come at 0645 and to be NPO after 0000.  Confirmed no missed doses of AC and instructed to take in AM with a small sip of water.  Confirmed that pt will have a ride home and someone to stay with them for 24 hours after the procedure. Instructed patient to not wear any jewelry or lotion.

## 2024-02-23 NOTE — Anesthesia Preprocedure Evaluation (Signed)
 Anesthesia Evaluation    Reviewed: Allergy & Precautions, H&P , Patient's Chart, lab work & pertinent test results, Unable to perform ROS - Chart review only  Airway Mallampati: III  TM Distance: >3 FB Neck ROM: Full    Dental no notable dental hx. (+) Teeth Intact, Dental Advisory Given   Pulmonary neg pulmonary ROS, former smoker   Pulmonary exam normal breath sounds clear to auscultation       Cardiovascular hypertension, Pt. on medications + dysrhythmias Atrial Fibrillation + pacemaker + Valvular Problems/Murmurs MR  Rhythm:Irregular Rate:Normal  Pacer interrogation 01/2024 3% A pacemaker, 100% V paced  Echo 12/2023  1. Left ventricular ejection fraction, by estimation, is 55 to 60%. The left ventricle has normal function. The left ventricle has no regional wall motion abnormalities. There is mild left ventricular hypertrophy. Left ventricular diastolic parameters are consistent with Grade II diastolic dysfunction (pseudonormalization).   2. Right ventricular systolic function is normal. The right ventricular size is normal. There is mildly elevated pulmonary artery systolic pressure.   3. Left atrial size was moderately dilated.   4. Right atrial size was mildly dilated.   5. The mitral valve is normal in structure. Mild mitral valve regurgitation. No evidence of mitral stenosis.   6. The aortic valve is tricuspid. Aortic valve regurgitation is not visualized. No aortic stenosis is present.   7. Aortic dilatation noted. There is mild dilatation of the ascending aorta, measuring 40 mm.   8. The inferior vena cava is normal in size with greater than 50% respiratory variability, suggesting right atrial pressure of 3 mmHg.   Comparison(s): No significant change from prior study.     Neuro/Psych negative neurological ROS  negative psych ROS   GI/Hepatic negative GI ROS, Neg liver ROS,,,  Endo/Other  diabetes, Type 2, Oral  Hypoglycemic Agents    Renal/GU negative Renal ROS     Musculoskeletal  (+) Arthritis , Osteoarthritis,    Abdominal   Peds  Hematology  (+) Blood dyscrasia, anemia   Anesthesia Other Findings   Reproductive/Obstetrics                             Anesthesia Physical Anesthesia Plan  ASA: 3  Anesthesia Plan: General   Post-op Pain Management: Minimal or no pain anticipated   Induction: Intravenous  PONV Risk Score and Plan: 2 and Propofol infusion  Airway Management Planned: Natural Airway  Additional Equipment:   Intra-op Plan:   Post-operative Plan:   Informed Consent: I have reviewed the patients History and Physical, chart, labs and discussed the procedure including the risks, benefits and alternatives for the proposed anesthesia with the patient or authorized representative who has indicated his/her understanding and acceptance.     Dental advisory given  Plan Discussed with: CRNA  Anesthesia Plan Comments:        Anesthesia Quick Evaluation

## 2024-02-24 ENCOUNTER — Ambulatory Visit (HOSPITAL_COMMUNITY)
Admission: RE | Admit: 2024-02-24 | Discharge: 2024-02-24 | Disposition: A | Discharge status: Home / Self Care | Payer: Medicare PPO | Attending: Cardiovascular Disease | Admitting: Cardiovascular Disease

## 2024-02-24 ENCOUNTER — Encounter (HOSPITAL_COMMUNITY): Payer: Self-pay | Admitting: Anesthesiology

## 2024-02-24 ENCOUNTER — Encounter (HOSPITAL_COMMUNITY)
Admission: RE | Disposition: A | Discharge status: Home / Self Care | Payer: Self-pay | Source: Home / Self Care | Attending: Cardiovascular Disease

## 2024-02-24 DIAGNOSIS — Z539 Procedure and treatment not carried out, unspecified reason: Secondary | ICD-10-CM | POA: Diagnosis not present

## 2024-02-24 DIAGNOSIS — I4891 Unspecified atrial fibrillation: Secondary | ICD-10-CM | POA: Diagnosis not present

## 2024-02-24 SURGERY — CARDIOVERSION (CATH LAB)
Anesthesia: Monitor Anesthesia Care

## 2024-03-03 ENCOUNTER — Ambulatory Visit (HOSPITAL_BASED_OUTPATIENT_CLINIC_OR_DEPARTMENT_OTHER): Payer: Medicare PPO | Admitting: Cardiology

## 2024-03-03 ENCOUNTER — Encounter (HOSPITAL_BASED_OUTPATIENT_CLINIC_OR_DEPARTMENT_OTHER): Payer: Self-pay | Admitting: Cardiology

## 2024-03-03 VITALS — BP 112/60 | HR 88 | Ht 65.0 in | Wt 172.1 lb

## 2024-03-03 DIAGNOSIS — I442 Atrioventricular block, complete: Secondary | ICD-10-CM | POA: Diagnosis not present

## 2024-03-03 DIAGNOSIS — D6869 Other thrombophilia: Secondary | ICD-10-CM | POA: Diagnosis not present

## 2024-03-03 DIAGNOSIS — Z950 Presence of cardiac pacemaker: Secondary | ICD-10-CM

## 2024-03-03 DIAGNOSIS — Z7901 Long term (current) use of anticoagulants: Secondary | ICD-10-CM | POA: Diagnosis not present

## 2024-03-03 DIAGNOSIS — I480 Paroxysmal atrial fibrillation: Secondary | ICD-10-CM | POA: Diagnosis not present

## 2024-03-03 DIAGNOSIS — I10 Essential (primary) hypertension: Secondary | ICD-10-CM

## 2024-03-03 DIAGNOSIS — E119 Type 2 diabetes mellitus without complications: Secondary | ICD-10-CM

## 2024-03-03 MED ORDER — AMIODARONE HCL 200 MG PO TABS: 200.0000 mg | ORAL_TABLET | Freq: Every day | ORAL | 3 refills | Status: DC

## 2024-03-03 NOTE — Patient Instructions (Signed)
 Medication Instructions:  Your physician recommends that you continue on your current medications as directed. Please refer to the Current Medication list given to you today.  *If you need a refill on your cardiac medications before your next appointment, please call your pharmacy*  Follow-Up: At [facility], you and your health needs are our priority.  As part of our continuing mission to provide you with exceptional heart care, we have created designated Provider Care Teams.  These Care Teams include your primary Cardiologist (physician) and Advanced Practice Providers (APPs -  Physician Assistants and Nurse Practitioners) who all work together to provide you with the care you need, when you need it.  We recommend signing up for the patient portal called "MyChart".  Sign up information is provided on this After Visit Summary.  MyChart is used to connect with patients for Virtual Visits (Telemedicine).  Patients are able to view lab/test results, encounter notes, upcoming appointments, etc.  Non-urgent messages can be sent to your provider as well.   To learn more about what you can do with MyChart, go to [email or web address].    Your next appointment:   6 month(s)  Provider:   Jodelle Red, MD, Gillian Shields, NP or Eligha Bridegroom, NP

## 2024-03-03 NOTE — Progress Notes (Signed)
 Cardiology Office Note:  .    Date:  03/03/2024  ID:  [name], DOB 1941/11/22, MRN [id number] PCP: Soundra Pilon, FNP   HeartCare Providers Cardiologist:  Jodelle Red, MD Electrophysiologist:  Hillis Range, MD (Inactive)     History of Present Illness: .    Benjamin Fuller is a 83 y.o. 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 I initially saw him 01/06/20 as a new consult at the request of Soundra Pilon, FNP for the evaluation and management of atrial fibrillation.   CV history: Had afib ablation with Dr. Johney Frame on 3/31/022. 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 He has undergone several cardioversions, 07/2023 and 11/2023.  Today: Was scheduled for cardioversion 2/24, but was found to be in a-paced, v sensed rhythm. He mentioned that they checked his device when he was in preop for the procedure, but I cannot see a report.  He was not aware he was in afib this most recent time. Has been tolerating amiodarone.  Going to silver sneakers twice a week, has only occasional mild pedal edema. Now off pioglitazone, swelling has improved.  Denies chest pain, shortness of breath at rest or with normal exertion. No PND, orthopnea, LE edema or unexpected weight gain. No syncope or palpitations. ROS otherwise negative except as noted.   ROS:  Please see the history of present illness. ROS otherwise negative except as noted.   Studies Reviewed: Marland Kitchen         Physical Exam:    VS:  BP 112/60   Pulse 88   Ht 5\' 5"  (1.651 m)   Wt 172 lb 1.6 oz (78.1 kg)   SpO2 97%   BMI 28.64 kg/m    Wt Readings from Last 3 Encounters:  03/03/24 172 lb 1.6 oz (78.1 kg)  02/18/24 170 lb 9.6 oz (77.4 kg)  01/28/24 172 lb 9.6 oz (78.3 kg)    GEN: Well nourished, well developed in no  acute distress HEENT: Normal, moist mucous membranes NECK: No JVD CARDIAC: largely regular rhythm with occasional premature beats, normal S1 and S2, no rubs or gallops. 1/6 systolic murmur. VASCULAR: Radial and DP pulses 2+ bilaterally. No carotid bruits RESPIRATORY:  Clear to auscultation without rales, wheezing or rhonchi  ABDOMEN: Soft, non-tender, non-distended MUSCULOSKELETAL:  Ambulates independently SKIN: Warm and dry, no edema NEUROLOGIC:  Alert and oriented x 3. No focal neuro deficits noted. PSYCHIATRIC:  Normal affect    ASSESSMENT AND PLAN: .    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  -echo as above -he presented for cardioversion but was told he was in rhythm. His ECG is paced, so this must have been by device interrogation, but I do not see results -continue amiodarone, reordered today. Will need monitoring labs at next visit if not done sooner -feels much better in sinus rhythm, aim for rhythm control strategy -symptoms much improved with pacemaker implant   Hypertension: at goal -continue ramipril 10 mg daily -has lasix PRN, reviewed when to use this, has required once in the last month   Hyperlipidemia, mixed: Last was 95, though was 72 on prior labs per [facility], on simvastatin. Tolerating well, no change to therapy at this time.   Type II diabetes: on  empagliflozin, glimepiride, and metformin. -now off pioglitazone, swelling improved   CV risk and primary prevention: -recommend heart healthy/Mediterranean diet, with whole grains, fruits, vegetable, fish, lean meats, nuts, and olive oil. Limit salt. -recommend moderate walking, 3-5 times/week for 30-50 minutes each session. Aim for at least 150 minutes.week. Goal should be pace of 3 miles/hours, or walking 1.5 miles in 30 minutes -recommend avoidance of tobacco products. Avoid excess alcohol.    Dispo: Follow-up in 6 months, or sooner as needed.  Signed, Jodelle Red, MD

## 2024-03-10 ENCOUNTER — Ambulatory Visit (HOSPITAL_BASED_OUTPATIENT_CLINIC_OR_DEPARTMENT_OTHER): Payer: Medicare PPO | Admitting: Cardiology

## 2024-03-17 DIAGNOSIS — E119 Type 2 diabetes mellitus without complications: Secondary | ICD-10-CM | POA: Diagnosis not present

## 2024-03-17 DIAGNOSIS — H5203 Hypermetropia, bilateral: Secondary | ICD-10-CM | POA: Diagnosis not present

## 2024-03-17 DIAGNOSIS — Z961 Presence of intraocular lens: Secondary | ICD-10-CM | POA: Diagnosis not present

## 2024-04-02 DIAGNOSIS — N400 Enlarged prostate without lower urinary tract symptoms: Secondary | ICD-10-CM | POA: Diagnosis not present

## 2024-04-02 DIAGNOSIS — I2510 Atherosclerotic heart disease of native coronary artery without angina pectoris: Secondary | ICD-10-CM | POA: Diagnosis not present

## 2024-04-02 DIAGNOSIS — E1151 Type 2 diabetes mellitus with diabetic peripheral angiopathy without gangrene: Secondary | ICD-10-CM | POA: Diagnosis not present

## 2024-04-02 DIAGNOSIS — Z89012 Acquired absence of left thumb: Secondary | ICD-10-CM | POA: Diagnosis not present

## 2024-04-02 DIAGNOSIS — I110 Hypertensive heart disease with heart failure: Secondary | ICD-10-CM | POA: Diagnosis not present

## 2024-04-02 DIAGNOSIS — I509 Heart failure, unspecified: Secondary | ICD-10-CM | POA: Diagnosis not present

## 2024-04-02 DIAGNOSIS — M1990 Unspecified osteoarthritis, unspecified site: Secondary | ICD-10-CM | POA: Diagnosis not present

## 2024-04-02 DIAGNOSIS — E785 Hyperlipidemia, unspecified: Secondary | ICD-10-CM | POA: Diagnosis not present

## 2024-04-02 DIAGNOSIS — R32 Unspecified urinary incontinence: Secondary | ICD-10-CM | POA: Diagnosis not present

## 2024-04-02 DIAGNOSIS — I4891 Unspecified atrial fibrillation: Secondary | ICD-10-CM | POA: Diagnosis not present

## 2024-04-02 DIAGNOSIS — I4430 Unspecified atrioventricular block: Secondary | ICD-10-CM | POA: Diagnosis not present

## 2024-04-02 DIAGNOSIS — D6869 Other thrombophilia: Secondary | ICD-10-CM | POA: Diagnosis not present

## 2024-04-10 ENCOUNTER — Ambulatory Visit (INDEPENDENT_AMBULATORY_CARE_PROVIDER_SITE_OTHER): Payer: Medicare Other

## 2024-04-10 DIAGNOSIS — I442 Atrioventricular block, complete: Secondary | ICD-10-CM

## 2024-04-11 LAB — CUP PACEART REMOTE DEVICE CHECK
Battery Remaining Longevity: 126 mo
Battery Remaining Percentage: 100 %
Brady Statistic RA Percent Paced: 8 %
Brady Statistic RV Percent Paced: 100 %
Date Time Interrogation Session: 20250411024100
Implantable Lead Connection Status: 753985
Implantable Lead Connection Status: 753985
Implantable Lead Implant Date: 20221222
Implantable Lead Implant Date: 20221222
Implantable Lead Location: 753859
Implantable Lead Location: 753860
Implantable Lead Model: 7841
Implantable Lead Model: 7842
Implantable Lead Serial Number: 1102393
Implantable Lead Serial Number: 1161013
Implantable Pulse Generator Implant Date: 20221222
Lead Channel Impedance Value: 693 Ohm
Lead Channel Impedance Value: 705 Ohm
Lead Channel Pacing Threshold Amplitude: 0.5 V
Lead Channel Pacing Threshold Pulse Width: 0.4 ms
Lead Channel Setting Pacing Amplitude: 2.5 V
Lead Channel Setting Pacing Amplitude: 2.5 V
Lead Channel Setting Pacing Pulse Width: 0.4 ms
Lead Channel Setting Sensing Sensitivity: 2.5 mV
Pulse Gen Serial Number: 562911
Zone Setting Status: 755011

## 2024-04-14 ENCOUNTER — Encounter: Payer: Self-pay | Admitting: Internal Medicine

## 2024-04-20 ENCOUNTER — Other Ambulatory Visit: Payer: Self-pay

## 2024-04-20 ENCOUNTER — Emergency Department (HOSPITAL_BASED_OUTPATIENT_CLINIC_OR_DEPARTMENT_OTHER): Admission: EM | Admit: 2024-04-20 | Discharge: 2024-04-20 | Disposition: A | Discharge status: Home / Self Care

## 2024-04-20 ENCOUNTER — Emergency Department (HOSPITAL_BASED_OUTPATIENT_CLINIC_OR_DEPARTMENT_OTHER)

## 2024-04-20 ENCOUNTER — Encounter (HOSPITAL_BASED_OUTPATIENT_CLINIC_OR_DEPARTMENT_OTHER): Payer: Self-pay | Admitting: Emergency Medicine

## 2024-04-20 ENCOUNTER — Emergency Department (HOSPITAL_COMMUNITY)

## 2024-04-20 ENCOUNTER — Emergency Department (HOSPITAL_BASED_OUTPATIENT_CLINIC_OR_DEPARTMENT_OTHER): Admitting: Radiology

## 2024-04-20 DIAGNOSIS — R079 Chest pain, unspecified: Secondary | ICD-10-CM | POA: Diagnosis not present

## 2024-04-20 DIAGNOSIS — W0110XA Fall on same level from slipping, tripping and stumbling with subsequent striking against unspecified object, initial encounter: Secondary | ICD-10-CM | POA: Insufficient documentation

## 2024-04-20 DIAGNOSIS — Z7901 Long term (current) use of anticoagulants: Secondary | ICD-10-CM | POA: Diagnosis not present

## 2024-04-20 DIAGNOSIS — Z981 Arthrodesis status: Secondary | ICD-10-CM | POA: Diagnosis not present

## 2024-04-20 DIAGNOSIS — M5031 Other cervical disc degeneration,  high cervical region: Secondary | ICD-10-CM | POA: Diagnosis not present

## 2024-04-20 DIAGNOSIS — Z043 Encounter for examination and observation following other accident: Secondary | ICD-10-CM | POA: Diagnosis not present

## 2024-04-20 DIAGNOSIS — M50221 Other cervical disc displacement at C4-C5 level: Secondary | ICD-10-CM | POA: Diagnosis not present

## 2024-04-20 DIAGNOSIS — S0990XA Unspecified injury of head, initial encounter: Secondary | ICD-10-CM | POA: Diagnosis not present

## 2024-04-20 DIAGNOSIS — S0081XA Abrasion of other part of head, initial encounter: Secondary | ICD-10-CM | POA: Insufficient documentation

## 2024-04-20 DIAGNOSIS — M25512 Pain in left shoulder: Secondary | ICD-10-CM | POA: Insufficient documentation

## 2024-04-20 DIAGNOSIS — M25511 Pain in right shoulder: Secondary | ICD-10-CM | POA: Insufficient documentation

## 2024-04-20 DIAGNOSIS — I771 Stricture of artery: Secondary | ICD-10-CM | POA: Diagnosis not present

## 2024-04-20 DIAGNOSIS — W19XXXA Unspecified fall, initial encounter: Secondary | ICD-10-CM | POA: Diagnosis not present

## 2024-04-20 DIAGNOSIS — S064X0A Epidural hemorrhage without loss of consciousness, initial encounter: Secondary | ICD-10-CM | POA: Diagnosis not present

## 2024-04-20 DIAGNOSIS — M542 Cervicalgia: Secondary | ICD-10-CM | POA: Insufficient documentation

## 2024-04-20 DIAGNOSIS — S12101A Unspecified nondisplaced fracture of second cervical vertebra, initial encounter for closed fracture: Secondary | ICD-10-CM

## 2024-04-20 DIAGNOSIS — M4802 Spinal stenosis, cervical region: Secondary | ICD-10-CM | POA: Diagnosis not present

## 2024-04-20 LAB — CBG MONITORING, ED: Glucose-Capillary: 96 mg/dL (ref 70–99)

## 2024-04-20 MED ORDER — METHOCARBAMOL 500 MG PO TABS: 500.0000 mg | ORAL_TABLET | Freq: Two times a day (BID) | ORAL | 0 refills | Status: DC

## 2024-04-20 MED ORDER — METHOCARBAMOL 500 MG PO TABS
500.0000 mg | ORAL_TABLET | Freq: Once | ORAL | Status: AC
  Administered 2024-04-20: 500 mg via ORAL
  Filled 2024-04-20: qty 1

## 2024-04-20 NOTE — ED Provider Notes (Addendum)
  Physical Exam  BP 133/71   Pulse 63   Temp 97.9 F (36.6 C) (Oral)   Resp 16   SpO2 98%   Physical Exam  Procedures  Procedures  ED Course / MDM   Clinical Course as of 04/20/24 1530  Mon Apr 20, 2024  1158 CT Cervical Spine Wo Contrast IMPRESSION: 1. Acute Fracture of the Right C2 Lamina and spinous process is minimally displaced. Associated epidural hemorrhage. And evidence of associated anterior ligamentous injury.  2. C1-C2 and craniocervical alignment maintained, and no other acute cervical vertebral fracture identified.  3. C5-C7 ACDF with solid arthrodesis and degenerative ankylosis of C3-C4. Adjacent segment disease.   Electronically Signed   By: Marlise Simpers M.D.   On: 04/20/2024 11:54   [TY]  1251 Spoke with neurosurgery, recommending aspirin c-collar and MRI.  Unfortunately do not have capability here at [facility].  Will transfer for MRI. [TY]  1327 Patient prefers to go POV.  No neurodeficits and symptoms have been present since Friday.  Not unreasonable.  Placed in c-collar per neurosurgery recommendations.  Will go POV. [TY]    Clinical Course User Index [TY] Rolinda Climes, DO   Medical Decision Making Amount and/or Complexity of Data Reviewed Radiology: ordered. Decision-making details documented in ED Course.  Risk Prescription drug management.   [facility].  MRI for cervical spine fracture.  No deficits.  Discussed with neurosurgery after  Discussed with Dr. Gwendlyn Lemmings.  He reviewed MRI and interpreted.  Hold Eliquis  for 3 days.  Follow-up in 2 weeks with Dr. Larrie Po.  Wear cervical collar.  Discussed with patient and will discharge home.       Mozell Arias, MD 04/20/24 1531    Mozell Arias, MD 04/20/24 [phone]

## 2024-04-20 NOTE — ED Provider Notes (Signed)
 [facility] AT [facility] Provider Note   CSN: [id number] Arrival date & time: 04/20/24  1027     History  Chief Complaint  Patient presents with   Benjamin Fuller is a 83 y.o. male.  This is an 83 year old male presenting emergency department for neck pain and upper shoulder pain.  States he was standing on a box on Friday doing housework and lost balance and fell forward.  Hit his head.  No LOC.  Has had persistent pain to back of his neck and upper shoulders.  No numbness tingling changes in sensation.  He is on Eliquis .  He denies headache, vision changes, facial droop, unilateral weakness, seizures, chest pain, shortness of breath, abdominal pain.  He is taken over-the-counter Tylenol  and ibuprofen  as well as lidocaine  patch with little improvement.   Fall       Home Medications Prior to Admission medications   Medication Sig Start Date End Date Taking? Authorizing Provider  methocarbamol  (ROBAXIN ) 500 MG tablet Take 1 tablet (500 mg total) by mouth 2 (two) times daily. 04/20/24  Yes Rolinda Climes, DO  amiodarone  (PACERONE ) 200 MG tablet Take 1 tablet (200 mg total) by mouth daily. 03/03/24   Sheryle Donning, MD  apixaban  (ELIQUIS ) 5 MG TABS tablet TAKE 1 TABLET TWICE DAILY 11/11/23   Sheryle Donning, MD  diphenhydramine -acetaminophen  (TYLENOL  PM) 25-500 MG TABS tablet Take 1 tablet by mouth at bedtime.    [provider]  Emollient (EUCERIN) lotion Apply 1 Application topically as needed for dry skin (affected areas).    [provider]  ferrous gluconate  (FERGON) 324 MG tablet Take 324 mg by mouth daily with breakfast.    [provider]  furosemide  (LASIX ) 40 MG tablet Take 1 tablet (40 mg total) by mouth daily as needed. For weight gain >5 lbs, swelling, shortness of breath 01/09/21 02/18/24  Sheryle Donning, MD  glimepiride (AMARYL) 4 MG tablet Take 4 mg by mouth 2 (two) times daily.      [provider]  ibuprofen  (ADVIL ) 200 MG tablet Take 400 mg by mouth every 6 (six) hours as needed for moderate pain (pain score 4-6).    [provider]  JARDIANCE 25 MG TABS tablet Take 25 mg by mouth daily.    [provider]  metFORMIN (GLUCOPHAGE-XR) 500 MG 24 hr tablet Take 500 mg by mouth daily with breakfast.    [provider]  Multiple Vitamin (MULTIVITAMIN WITH MINERALS) TABS tablet Take 1 tablet by mouth daily with breakfast.    [provider]  ramipril  (ALTACE ) 10 MG capsule Take 10 mg by mouth in the morning.    [provider]  simvastatin  (ZOCOR ) 40 MG tablet Take 40 mg by mouth at bedtime.    [provider]  sodium chloride  (OCEAN) 0.65 % SOLN nasal spray Place 1 spray into both nostrils at bedtime.    [provider]  tamsulosin  (FLOMAX ) 0.4 MG CAPS capsule Take 0.4 mg by mouth in the morning.    [provider]  TYLENOL  8 HOUR ARTHRITIS PAIN 650 MG CR tablet Take 650 mg by mouth every 8 (eight) hours as needed for pain.    [provider]  vitamin B-12 (CYANOCOBALAMIN ) 1000 MCG tablet Take 1,000 mcg by mouth daily with lunch.    [provider]      Allergies    Metformin hcl and Penicillins    Review of Systems  Review of Systems  Physical Exam Updated Vital Signs BP 133/71   Pulse 63   Temp 97.9 F (36.6 C) (Oral)   Resp 16   SpO2 98%  Physical Exam Vitals and nursing note reviewed.  Constitutional:      General: He is not in acute distress. HENT:     Head: Normocephalic.     Comments: Abrasions to top of forehead.    Nose: Nose normal.     Mouth/Throat:     Mouth: Mucous membranes are moist.  Eyes:     Extraocular Movements: Extraocular movements intact.     Pupils: Pupils are equal, round, and reactive to light.  Neck:     Comments: No midline spinal tenderness.  Does have some diffuse tenderness around the paracervical musculature. Cardiovascular:      Rate and Rhythm: Normal rate and regular rhythm.  Pulmonary:     Effort: Pulmonary effort is normal.     Breath sounds: Normal breath sounds.  Abdominal:     General: Abdomen is flat. There is no distension.     Palpations: Abdomen is soft.     Tenderness: There is no abdominal tenderness. There is no guarding or rebound.  Musculoskeletal:        General: Normal range of motion.  Skin:    General: Skin is warm and dry.     Capillary Refill: Capillary refill takes less than 2 seconds.  Neurological:     Mental Status: He is alert and oriented to person, place, and time.  Psychiatric:        Mood and Affect: Mood normal.        Behavior: Behavior normal.     ED Results / Procedures / Treatments   Labs (all labs ordered are listed, but only abnormal results are displayed) Labs Reviewed - No data to display  EKG None  Radiology CT Cervical Spine Wo Contrast Addendum Date: 04/20/2024 ADDENDUM REPORT: 04/20/2024 12:07 ADDENDUM: Study discussed by telephone with Dr. Elise Guile on 04/20/2024 at 1158 hours. Electronically Signed   By: Marlise Simpers M.D.   On: 04/20/2024 12:07   Result Date: 04/20/2024 CLINICAL DATA:  83 year old male status post fall 3 days ago. Persistent neck and shoulder pain. EXAM: CT CERVICAL SPINE WITHOUT CONTRAST TECHNIQUE: Multidetector CT imaging of the cervical spine was performed without intravenous contrast. Multiplanar CT image reconstructions were also generated. RADIATION DOSE REDUCTION: This exam was performed according to the departmental dose-optimization program which includes automated exposure control, adjustment of the mA and/or kV according to patient size and/or use of iterative reconstruction technique. COMPARISON:  Cervical spine MRI 09/03/2019. Chest CT 10/02/2023. FINDINGS: Alignment: Stable cervical lordosis since 2020. Maintained cervicothoracic junction alignment, posterior element alignment. Skull base and vertebrae: Bone mineralization is within  normal limits for age. Postoperative changes since 2020 are detailed below. Visualized skull base is intact. No atlanto-occipital dissociation. Acute fracture of the right C2 lamina posteriorly near the junction with the C2 spinous process series 7, image 28. This is minimally displaced, and the fracture lucency continues into the C2 spinous process on the right. See series 7 images 28-33 and coronal images 35-42. There is maintained C1-C2 alignment. C1 ring appears intact. Odontoid and C2 pedicles remain intact. No other acute cervical spine fracture identified. Postoperative and degenerative ankylosis detailed below. Soft tissues and spinal canal: Indistinctness also most pronounced at the C3 level. Chronic right thyroid  cystic nodule stable x5 years (no follow-up imaging recommended). Disc levels: Degenerative C3-C4 facet ankylosis  on the left. Superimposed C5-C6 and C6-C7 ACDF with solid arthrodesis. Moderate to severe adjacent segment disease at both C4-C5 and C7-T1, although evidence of developing facet ankylosis at the latter on the right side. Upper chest: Mild upper thoracic T2 and T3 compression fractures appears stable from the CT last year. Negative lung apices. Partially visible left chest cardiac pacemaker which is chronic. Other findings: Cervicomedullary junction is within normal limits. No intracranial hemorrhage or mass effect is evident. IMPRESSION: 1. Acute Fracture of the Right C2 Lamina and spinous process is minimally displaced. Associated epidural hemorrhage. And evidence of associated anterior ligamentous injury. 2. C1-C2 and craniocervical alignment maintained, and no other acute cervical vertebral fracture identified. 3. C5-C7 ACDF with solid arthrodesis and degenerative ankylosis of C3-C4. Adjacent segment disease. Electronically Signed: By: Marlise Simpers M.D. On: 04/20/2024 11:54   DG Chest 2 View Result Date: 04/20/2024 CLINICAL DATA:  83 year old male status post fall 3 days ago with  pain. EXAM: CHEST - 2 VIEW COMPARISON:  Chest CT 10/02/2023 and earlier. FINDINGS: PA and lateral views 1125 hours. Chronic dual lead left chest cardiac pacemaker. Lung volumes remain normal. Stable tortuosity of the thoracic aorta. Other mediastinal contours are within normal limits. No pneumothorax, pulmonary edema, pleural effusion or confluent lung opacity. Partially visible cervical ACDF. Visible thorax osseous structures appears stable from the October CT, with subtotal thoracic spinal ankylosis from hyperostosis. Negative visible bowel gas. IMPRESSION: No acute cardiopulmonary abnormality or acute traumatic injury identified in the chest. Electronically Signed   By: Marlise Simpers M.D.   On: 04/20/2024 11:56    Procedures Procedures    Medications Ordered in ED Medications - No data to display  ED Course/ Medical Decision Making/ A&P Clinical Course as of 04/20/24 1534  Mon Apr 20, 2024  1158 CT Cervical Spine Wo Contrast IMPRESSION: 1. Acute Fracture of the Right C2 Lamina and spinous process is minimally displaced. Associated epidural hemorrhage. And evidence of associated anterior ligamentous injury.  2. C1-C2 and craniocervical alignment maintained, and no other acute cervical vertebral fracture identified.  3. C5-C7 ACDF with solid arthrodesis and degenerative ankylosis of C3-C4. Adjacent segment disease.   Electronically Signed   By: Marlise Simpers M.D.   On: 04/20/2024 11:54   [TY]  1251 Spoke with neurosurgery, recommending aspirin c-collar and MRI.  Unfortunately do not have capability here at [facility].  Will transfer for MRI. [TY]  1327 Patient prefers to go POV.  No neurodeficits and symptoms have been present since Friday.  Not unreasonable.  Placed in c-collar per neurosurgery recommendations.  Will go POV. [TY]    Clinical Course User Index [TY] Rolinda Climes, DO                                 Medical Decision Making This is a well-appearing 83 year old male  presenting emergency department for neck pain.  Mechanical fall on Friday.  He is afebrile nontachycardic, slightly hypertensive on arrival, but improved without intervention.  Without localizing neurodeficits.  Does have prior spinal fusions per chart review.  Also, appears that he is on Eliquis  for atrial fibrillation.  Considered CT head, however patient without headache or neurodeficits.  Lower suspicion for clinically relevant intracranial pathology.  However given his age and prior instrumentation with neck pain concern for possible osseous pathology.  CT scan with C2 fracture.  Placed in Aspen collar after discussion with neurosurgery.  See ED  course.  Chest x-ray today without obvious pathology.  Will send to [facility] for [facility]; accepted by Dr. Martina Sledge at [facility] ED.   Amount and/or Complexity of Data Reviewed Independent Historian:     Details: Family member notes patient acting normal self Labs:     Details: Considered labs, however patient with musculoskeletal type pain.  Low suspicion for metabolic derangements or acute anemia Radiology: ordered. Decision-making details documented in ED Course.  Risk Prescription drug management. Decision regarding hospitalization.          Final Clinical Impression(s) / ED Diagnoses Final diagnoses:  Neck pain    Rx / DC Orders ED Discharge Orders          Ordered    methocarbamol  (ROBAXIN ) 500 MG tablet  2 times daily        04/20/24 1407              Rolinda Climes, DO 04/20/24 1534

## 2024-04-20 NOTE — ED Triage Notes (Signed)
 Mechanical fall on Friday. States he was standing on a box and fell "face first off". Small abrasion to forehead. Denies LOC. States pain in neck and bilateral shoulders. Arrived wearing soft neck brace. Hx of neck surgeries. No deficits.   Takes eliquis  .

## 2024-04-20 NOTE — ED Notes (Addendum)
 Patient transported to MRI

## 2024-05-05 DIAGNOSIS — S12190D Other displaced fracture of second cervical vertebra, subsequent encounter for fracture with routine healing: Secondary | ICD-10-CM | POA: Diagnosis not present

## 2024-05-05 DIAGNOSIS — Z6826 Body mass index (BMI) 26.0-26.9, adult: Secondary | ICD-10-CM | POA: Diagnosis not present

## 2024-05-14 NOTE — Progress Notes (Signed)
 Remote pacemaker transmission.

## 2024-05-14 NOTE — Addendum Note (Signed)
 Addended by: Lott Rouleau A on: 05/14/2024 11:56 AM   Modules accepted: Orders

## 2024-05-19 ENCOUNTER — Other Ambulatory Visit (HOSPITAL_BASED_OUTPATIENT_CLINIC_OR_DEPARTMENT_OTHER): Payer: Self-pay | Admitting: Cardiology

## 2024-05-19 DIAGNOSIS — I4819 Other persistent atrial fibrillation: Secondary | ICD-10-CM

## 2024-05-19 DIAGNOSIS — Z7901 Long term (current) use of anticoagulants: Secondary | ICD-10-CM

## 2024-05-20 NOTE — Telephone Encounter (Signed)
 Pt last saw Dr Veryl Gottron on 03/03/24, last labs 02/18/24 Creat 1.03, age 83, weight 78.1kg, based on specified criteria pt is on appropriate dosage of Eliquis  5mg  BID for afib.  Will refill rx.

## 2024-06-04 DIAGNOSIS — E1169 Type 2 diabetes mellitus with other specified complication: Secondary | ICD-10-CM | POA: Diagnosis not present

## 2024-06-04 DIAGNOSIS — I480 Paroxysmal atrial fibrillation: Secondary | ICD-10-CM | POA: Diagnosis not present

## 2024-06-04 DIAGNOSIS — I77810 Thoracic aortic ectasia: Secondary | ICD-10-CM | POA: Diagnosis not present

## 2024-06-04 DIAGNOSIS — N400 Enlarged prostate without lower urinary tract symptoms: Secondary | ICD-10-CM | POA: Diagnosis not present

## 2024-06-04 DIAGNOSIS — I442 Atrioventricular block, complete: Secondary | ICD-10-CM | POA: Diagnosis not present

## 2024-06-04 DIAGNOSIS — K760 Fatty (change of) liver, not elsewhere classified: Secondary | ICD-10-CM | POA: Diagnosis not present

## 2024-06-04 DIAGNOSIS — I700 Atherosclerosis of aorta: Secondary | ICD-10-CM | POA: Diagnosis not present

## 2024-06-04 DIAGNOSIS — E782 Mixed hyperlipidemia: Secondary | ICD-10-CM | POA: Diagnosis not present

## 2024-06-04 DIAGNOSIS — I10 Essential (primary) hypertension: Secondary | ICD-10-CM | POA: Diagnosis not present

## 2024-06-09 DIAGNOSIS — Z6826 Body mass index (BMI) 26.0-26.9, adult: Secondary | ICD-10-CM | POA: Diagnosis not present

## 2024-06-09 DIAGNOSIS — S12190D Other displaced fracture of second cervical vertebra, subsequent encounter for fracture with routine healing: Secondary | ICD-10-CM | POA: Diagnosis not present

## 2024-06-15 DIAGNOSIS — M25552 Pain in left hip: Secondary | ICD-10-CM | POA: Diagnosis not present

## 2024-06-15 DIAGNOSIS — M25551 Pain in right hip: Secondary | ICD-10-CM | POA: Diagnosis not present

## 2024-06-24 ENCOUNTER — Telehealth: Payer: Self-pay | Admitting: Physician Assistant

## 2024-06-24 NOTE — Telephone Encounter (Signed)
 Patient c/o Palpitations:  STAT if patient reporting lightheadedness, shortness of breath, or chest pain  How long have you had palpitations/irregular HR/ Afib? Are you having the symptoms now? Since yesterday, yes  Are you currently experiencing lightheadedness, SOB or CP? no  Do you have a history of afib (atrial fibrillation) or irregular heart rhythm? yes  Have you checked your BP or HR? (document readings if available): no  Are you experiencing any other symptoms? fatigued Wife said Renee told her the next time he feels like he is in afib to call and we could be a pacemaker check

## 2024-06-24 NOTE — Telephone Encounter (Signed)
 Spoke with patient/ and wife.  Patient has been feeling extreme fatigue and some weakness since yesterday.   These are the symptoms he feels when he is in AF.   Transmission sent.  Presenting is an AS/VP rhythm 80's with some FFOS.  No recorded AF since January.  No episodes that correlate with symptoms.  Patient should follow up with PCP for symptoms.   Also due for routine follow up ( ) with Dr. Waddell.   Will send to EP scheduling to make appt and add to notes:  FFOS (can consider if programming changes are needed at time of visit).

## 2024-06-25 DIAGNOSIS — R630 Anorexia: Secondary | ICD-10-CM | POA: Diagnosis not present

## 2024-06-25 DIAGNOSIS — R5383 Other fatigue: Secondary | ICD-10-CM | POA: Diagnosis not present

## 2024-06-25 DIAGNOSIS — R509 Fever, unspecified: Secondary | ICD-10-CM | POA: Diagnosis not present

## 2024-06-25 NOTE — Telephone Encounter (Signed)
 Reviewed with Joey BSX, no programming changes needed.  Device marking appropriately per [facility] algorithm.  (Brackets around the AS mean it is blanking, Parenthesis around the initials mean its in refractory.

## 2024-06-26 ENCOUNTER — Emergency Department (HOSPITAL_COMMUNITY)

## 2024-06-26 ENCOUNTER — Other Ambulatory Visit: Payer: Self-pay

## 2024-06-26 ENCOUNTER — Emergency Department (HOSPITAL_COMMUNITY)
Admission: EM | Admit: 2024-06-26 | Discharge: 2024-06-26 | Disposition: A | Discharge status: Home / Self Care | Attending: Emergency Medicine | Admitting: Emergency Medicine

## 2024-06-26 ENCOUNTER — Encounter (HOSPITAL_COMMUNITY): Payer: Self-pay

## 2024-06-26 DIAGNOSIS — I10 Essential (primary) hypertension: Secondary | ICD-10-CM | POA: Diagnosis not present

## 2024-06-26 DIAGNOSIS — R509 Fever, unspecified: Secondary | ICD-10-CM | POA: Diagnosis present

## 2024-06-26 DIAGNOSIS — K5730 Diverticulosis of large intestine without perforation or abscess without bleeding: Secondary | ICD-10-CM | POA: Diagnosis not present

## 2024-06-26 DIAGNOSIS — J181 Lobar pneumonia, unspecified organism: Secondary | ICD-10-CM | POA: Diagnosis not present

## 2024-06-26 DIAGNOSIS — R590 Localized enlarged lymph nodes: Secondary | ICD-10-CM | POA: Diagnosis not present

## 2024-06-26 DIAGNOSIS — K8020 Calculus of gallbladder without cholecystitis without obstruction: Secondary | ICD-10-CM | POA: Diagnosis not present

## 2024-06-26 DIAGNOSIS — E119 Type 2 diabetes mellitus without complications: Secondary | ICD-10-CM | POA: Insufficient documentation

## 2024-06-26 DIAGNOSIS — Z7901 Long term (current) use of anticoagulants: Secondary | ICD-10-CM | POA: Diagnosis not present

## 2024-06-26 DIAGNOSIS — Z7984 Long term (current) use of oral hypoglycemic drugs: Secondary | ICD-10-CM | POA: Diagnosis not present

## 2024-06-26 DIAGNOSIS — A419 Sepsis, unspecified organism: Secondary | ICD-10-CM | POA: Diagnosis not present

## 2024-06-26 DIAGNOSIS — J189 Pneumonia, unspecified organism: Secondary | ICD-10-CM | POA: Diagnosis not present

## 2024-06-26 DIAGNOSIS — N400 Enlarged prostate without lower urinary tract symptoms: Secondary | ICD-10-CM | POA: Diagnosis not present

## 2024-06-26 LAB — CBC WITH DIFFERENTIAL/PLATELET
Abs Immature Granulocytes: 0.06 10*3/uL (ref 0.00–0.07)
Basophils Absolute: 0 10*3/uL (ref 0.0–0.1)
Basophils Relative: 0 %
Eosinophils Absolute: 0 10*3/uL (ref 0.0–0.5)
Eosinophils Relative: 0 %
HCT: 43.4 % (ref 39.0–52.0)
Hemoglobin: 14.1 g/dL (ref 13.0–17.0)
Immature Granulocytes: 1 %
Lymphocytes Relative: 5 %
Lymphs Abs: 0.6 10*3/uL — ABNORMAL LOW (ref 0.7–4.0)
MCH: 30.7 pg (ref 26.0–34.0)
MCHC: 32.5 g/dL (ref 30.0–36.0)
MCV: 94.6 fL (ref 80.0–100.0)
Monocytes Absolute: 0.9 10*3/uL (ref 0.1–1.0)
Monocytes Relative: 7 %
Neutro Abs: 11.1 10*3/uL — ABNORMAL HIGH (ref 1.7–7.7)
Neutrophils Relative %: 87 %
Platelets: 166 10*3/uL (ref 150–400)
RBC: 4.59 MIL/uL (ref 4.22–5.81)
RDW: 13 % (ref 11.5–15.5)
WBC: 12.7 10*3/uL — ABNORMAL HIGH (ref 4.0–10.5)
nRBC: 0 % (ref 0.0–0.2)

## 2024-06-26 LAB — URINALYSIS, W/ REFLEX TO CULTURE (INFECTION SUSPECTED)
Bacteria, UA: NONE SEEN
Bilirubin Urine: NEGATIVE
Glucose, UA: >=500 mg/dL — AB
Ketones, ur: 20 mg/dL — AB
Leukocytes,Ua: NEGATIVE
Nitrite: NEGATIVE
Protein, ur: 30 mg/dL — AB
Specific Gravity, Urine: 1.029 (ref 1.005–1.030)
pH: 5 (ref 5.0–8.0)

## 2024-06-26 LAB — PROTIME-INR
INR: 1.8 — ABNORMAL HIGH (ref 0.8–1.2)
Prothrombin Time: 21.4 s — ABNORMAL HIGH (ref 11.4–15.2)

## 2024-06-26 LAB — I-STAT CG4 LACTIC ACID, ED
Lactic Acid, Venous: 2.2 mmol/L (ref 0.5–1.9)
Lactic Acid, Venous: 2 mmol/L (ref 0.5–1.9)

## 2024-06-26 MED ORDER — DOXYCYCLINE HYCLATE 100 MG PO CAPS: 100.0000 mg | ORAL_CAPSULE | Freq: Two times a day (BID) | ORAL | 0 refills | Status: DC

## 2024-06-26 MED ORDER — LACTATED RINGERS IV SOLN: INTRAVENOUS | Status: DC

## 2024-06-26 MED ORDER — IOHEXOL 300 MG/ML  SOLN
100.0000 mL | Freq: Once | INTRAMUSCULAR | Status: AC | PRN
  Administered 2024-06-26: 100 mL via INTRAVENOUS

## 2024-06-26 MED ORDER — AZITHROMYCIN 250 MG PO TABS
500.0000 mg | ORAL_TABLET | Freq: Once | ORAL | Status: AC
  Administered 2024-06-26: 500 mg via ORAL
  Filled 2024-06-26: qty 2

## 2024-06-26 MED ORDER — SODIUM CHLORIDE 0.9 % IV SOLN
1.0000 g | Freq: Once | INTRAVENOUS | Status: AC
  Administered 2024-06-26: 1 g via INTRAVENOUS
  Filled 2024-06-26: qty 10

## 2024-06-26 MED ORDER — CEFDINIR 300 MG PO CAPS: 300.0000 mg | ORAL_CAPSULE | Freq: Two times a day (BID) | ORAL | 0 refills | Status: AC

## 2024-06-26 MED ORDER — AZITHROMYCIN 250 MG PO TABS: 250.0000 mg | ORAL_TABLET | Freq: Every day | ORAL | 0 refills | Status: DC

## 2024-06-26 MED ORDER — LACTATED RINGERS IV BOLUS (SEPSIS)
500.0000 mL | Freq: Once | INTRAVENOUS | Status: AC
  Administered 2024-06-26: 500 mL via INTRAVENOUS

## 2024-06-26 NOTE — ED Triage Notes (Signed)
 Patient presented to ER with high fevers (102) was seen at urgent care yesterday where he was told his white blood cell count was high. Patient has been feeling overall fatigued and malaise.

## 2024-06-26 NOTE — Discharge Instructions (Addendum)
 You have left sided pneumonia today.  You will need to take your antibiotics for the next 5 days.  If you start feeling worse become extremely short of breath, vomiting not able to hold anything down or other concerns return to the emergency room.  You will actually take both antibiotics 2 times a day.  You will start them tomorrow.

## 2024-06-26 NOTE — ED Notes (Signed)
 RN notified about I-Stat. RN said she would notify provider

## 2024-06-26 NOTE — ED Provider Notes (Addendum)
 [place] EMERGENCY DEPARTMENT AT [facility] Provider Note   CSN: [id number] Arrival date & time: 06/26/24  9158     Patient presents with: Fatigue   Benjamin Fuller is a 83 y.o. male.    Pt is a 82y/o male with hx of paroxysmal atrial fibrillation on eliquis , complete heart block s/p ICD, T2DM, Iron  deficiency anemia, HLD who is presenting today with 5 days of generalized weakness, fatigue and fever starting on Tuesday.  His wife reports that every day he has had a fever with the highest being 102 this morning around 7 AM.  Patient has had some anorexia but denies any chest pain, abdominal pain, shortness of breath, cough, vomiting, diarrhea or urinary issues.  He has not had any rashes.  His wife denies any confusion and he denies any headaches or neck pain.  No known tick exposure.  He had similar symptoms last October and at that time on CT was found to have a left lower lobe pneumonia.  He and his wife report that they told him it was probably not pneumonia and his fever of unknown origin but it did improve with antibiotics and he has been fine until now.  He had his pacemaker interrogated earlier in the week to make sure that was not causing his symptoms and he reports that that was fine.  He went to the [place] walk-in clinic yesterday had negative viral studies, normal CMP and CBC with leukocytosis of 12.  The history is provided by the patient and medical records.       Prior to Admission medications   Medication Sig Start Date End Date Taking? Authorizing Provider  azithromycin  (ZITHROMAX ) 250 MG tablet Take 1 tablet (250 mg total) by mouth daily. Take first 2 tablets together, then 1 every day until finished. 06/26/24  Yes Doretha Folks, MD  cefdinir  (OMNICEF ) 300 MG capsule Take 1 capsule (300 mg total) by mouth 2 (two) times daily for 5 days. 06/26/24 07/01/24 Yes Doretha Folks, MD  amiodarone  (PACERONE ) 200 MG tablet Take 1 tablet (200 mg total) by mouth daily.  03/03/24   Lonni Slain, MD  apixaban  (ELIQUIS ) 5 MG TABS tablet TAKE 1 TABLET TWICE DAILY 05/20/24   Lonni Slain, MD  diphenhydramine -acetaminophen  (TYLENOL  PM) 25-500 MG TABS tablet Take 1 tablet by mouth at bedtime.    [provider]  Emollient (EUCERIN) lotion Apply 1 Application topically as needed for dry skin (affected areas).    [provider]  ferrous gluconate  (FERGON) 324 MG tablet Take 324 mg by mouth daily with breakfast.    [provider]  furosemide  (LASIX ) 40 MG tablet Take 1 tablet (40 mg total) by mouth daily as needed. For weight gain >5 lbs, swelling, shortness of breath 01/09/21 02/18/24  Lonni Slain, MD  glimepiride (AMARYL) 4 MG tablet Take 4 mg by mouth 2 (two) times daily.     [provider]  ibuprofen  (ADVIL ) 200 MG tablet Take 400 mg by mouth every 6 (six) hours as needed for moderate pain (pain score 4-6).    [provider]  JARDIANCE 25 MG TABS tablet Take 25 mg by mouth daily.    [provider]  metFORMIN (GLUCOPHAGE-XR) 500 MG 24 hr tablet Take 500 mg by mouth daily with breakfast.    [provider]  methocarbamol  (ROBAXIN ) 500 MG tablet Take 1 tablet (500 mg total) by mouth 2 (two) times daily. 04/20/24   Neysa Caron PARAS, DO  Multiple Vitamin (MULTIVITAMIN  WITH MINERALS) TABS tablet Take 1 tablet by mouth daily with breakfast.    [provider]  ramipril  (ALTACE ) 10 MG capsule Take 10 mg by mouth in the morning.    [provider]  simvastatin  (ZOCOR ) 40 MG tablet Take 40 mg by mouth at bedtime.    [provider]  sodium chloride  (OCEAN) 0.65 % SOLN nasal spray Place 1 spray into both nostrils at bedtime.    [provider]  tamsulosin  (FLOMAX ) 0.4 MG CAPS capsule Take 0.4 mg by mouth in the morning.    [provider]  TYLENOL  8 HOUR ARTHRITIS PAIN 650 MG CR tablet Take 650 mg by mouth every 8 (eight) hours as needed for pain.     [provider]  vitamin B-12 (CYANOCOBALAMIN ) 1000 MCG tablet Take 1,000 mcg by mouth daily with lunch.    [provider]    Allergies: Metformin hcl and Penicillins    Review of Systems  Updated Vital Signs BP 122/82   Pulse 79   Temp 99.3 F (37.4 C) (Oral)   Resp 19   Ht 5' 5 (1.651 m)   Wt 73.9 kg   SpO2 98%   BMI 27.12 kg/m   Physical Exam Vitals and nursing note reviewed.  Constitutional:      General: He is not in acute distress.    Appearance: He is well-developed.  HENT:     Head: Normocephalic and atraumatic.   Eyes:     Conjunctiva/sclera: Conjunctivae normal.     Pupils: Pupils are equal, round, and reactive to light.    Cardiovascular:     Rate and Rhythm: Normal rate and regular rhythm.     Pulses: Normal pulses.     Heart sounds: No murmur heard. Pulmonary:     Effort: Pulmonary effort is normal. No respiratory distress.     Breath sounds: Normal breath sounds. No wheezing or rales.  Abdominal:     General: There is no distension.     Palpations: Abdomen is soft.     Tenderness: There is no abdominal tenderness. There is no right CVA tenderness, left CVA tenderness, guarding or rebound.   Musculoskeletal:        General: No tenderness. Normal range of motion.     Cervical back: Normal range of motion and neck supple. No tenderness.     Right lower leg: No edema.     Left lower leg: No edema.   Skin:    General: Skin is warm and dry.     Findings: No erythema or rash.   Neurological:     Mental Status: He is alert and oriented to person, place, and time.   Psychiatric:        Behavior: Behavior normal.     (all labs ordered are listed, but only abnormal results are displayed) Labs Reviewed  CBC WITH DIFFERENTIAL/PLATELET - Abnormal; Notable for the following components:      Result Value   WBC 12.7 (*)    Neutro Abs 11.1 (*)    Lymphs Abs 0.6 (*)    All other components within normal limits  PROTIME-INR -  Abnormal; Notable for the following components:   Prothrombin Time 21.4 (*)    INR 1.8 (*)    All other components within normal limits  URINALYSIS, W/ REFLEX TO CULTURE (INFECTION SUSPECTED) - Abnormal; Notable for the following components:   Glucose, UA >=500 (*)    Hgb urine dipstick SMALL (*)  Ketones, ur 20 (*)    Protein, ur 30 (*)    All other components within normal limits  I-STAT CG4 LACTIC ACID, ED - Abnormal; Notable for the following components:   Lactic Acid, Venous 2.2 (*)    All other components within normal limits  I-STAT CG4 LACTIC ACID, ED - Abnormal; Notable for the following components:   Lactic Acid, Venous 2.0 (*)    All other components within normal limits  CULTURE, BLOOD (ROUTINE X 2)  CULTURE, BLOOD (ROUTINE X 2)    EKG: EKG Interpretation Date/Time:  Friday June 26 2024 09:26:08 EDT Ventricular Rate:  82 PR Interval:  192 QRS Duration:  128 QT Interval:  433 QTC Calculation: 506 R Axis:   -18  Text Interpretation: ATRIAL SENSING AND PACING No significant change since last tracing Confirmed by Doretha Folks ([id number]) on 06/26/2024 9:58:52 AM  Radiology: CT CHEST ABDOMEN PELVIS W CONTRAST Result Date: 06/26/2024 CLINICAL DATA:  Sepsis. Elevated white blood cell count. Evaluate for source of infection. EXAM: CT CHEST, ABDOMEN, AND PELVIS WITH CONTRAST TECHNIQUE: Multidetector CT imaging of the chest, abdomen and pelvis was performed following the standard protocol during bolus administration of intravenous contrast. RADIATION DOSE REDUCTION: This exam was performed according to the departmental dose-optimization program which includes automated exposure control, adjustment of the mA and/or kV according to patient size and/or use of iterative reconstruction technique. CONTRAST:  OMNIPAQUE  IOHEXOL  300 MG/ML  SOLN COMPARISON:  10/02/2023 FINDINGS: CT CHEST FINDINGS Cardiovascular: The heart size is normal. Aortic atherosclerosis. No pericardial  effusion. Coronary artery calcifications. Left chest wall pacer device is noted with lead in the right atrial appendage and right ventricle. Mediastinum/Nodes: Heterogeneous hypodense nodule in right lobe of thyroid  gland measures 2.4 cm, image 8/2. In the setting of significant comorbidities or limited life expectancy, no follow-up recommended (ref: J Am Coll Radiol. 2015 Feb;12(2): 143-50). The trachea is patent and midline. Left paratracheal lymph node measures 1.5 cm, image 23/2. 1 cm left hilar lymph node, image 31/2. Lungs/Pleura: No pleural fluid, interstitial edema or pneumothorax. Ground-glass and airspace opacification within the posterior left upper lobe extends into the lingula. Right lung is clear. Musculoskeletal: There is degenerative disc disease identified within the thoracic spine. No acute or suspicious osseous findings. CT ABDOMEN PELVIS FINDINGS Hepatobiliary: No suspicious liver abnormality. Multiple calcified gallstones are noted measuring up to 5 mm. No gallbladder wall thickening or pericholecystic fluid. No bile duct dilatation identified. Pancreas: Unremarkable. No pancreatic ductal dilatation or surrounding inflammatory changes. Spleen: Normal in size without focal abnormality. Adrenals/Urinary Tract: Bilateral adrenal nodules are identified and are unchanged compared with the previous exam. The right adrenal nodule measures 1.1 cm. Left adrenal nodule measures 1.3 cm. These are consistent with benign adenomas. Multiple Bosniak class 1 right kidney cyst. These measure up to 2.5 cm, image 71/2. Unchanged appearance the left kidney. Chronic cyst off the upper pole of the left kidney with posterior mural calcifications is unchanged measuring 2.7 cm and 16 Hounsfield units. Previously ruptured cyst subjacent cyst with chronic soft tissue stranding in the posterior pararenal fat is unchanged. No nephrolithiasis or signs of obstructive uropathy. Multiple small stones within the dependent  portion of the urinary bladder are again noted measuring up to 5 mm, image 106/2. Stomach/Bowel: The stomach is normal. No pathologic dilatation of the large or small bowel loops. No bowel wall thickening, inflammation or distension. Distal colonic diverticulosis without signs of acute diverticulitis. Vascular/Lymphatic: Aortic atherosclerosis without signs of aneurysm. No abdominopelvic adenopathy.  Reproductive: Prostate gland enlargement. Other: No free fluid or fluid collections. Musculoskeletal: Status post ORIF of the proximal left femur. New subacute or chronic fracture involving the left inferior pubic rami, image 120/2 IMPRESSION: 1. Ground-glass and airspace opacification within the posterior left upper lobe extends into the lingula. Imaging features are compatible with pneumonia. 2. Mildly enlarged left paratracheal and left hilar lymph nodes, likely reactive. 3. Cholelithiasis. 4. Multiple small stones within the dependent portion of the urinary bladder are again noted measuring up to 5 mm. 5. Distal colonic diverticulosis without signs of acute diverticulitis. 6. Prostate gland enlargement. 7. Subacute or chronic fracture involving the left inferior pubic rami. 8.  Aortic Atherosclerosis (ICD10-I70.0). Electronically Signed   By: Waddell Calk M.D.   On: 06/26/2024 12:17   DG Chest Port 1 View Result Date: 06/26/2024 CLINICAL DATA:  Sepsis EXAM: PORTABLE CHEST 1 VIEW COMPARISON:  Chest x-ray April 21 FINDINGS: Left-sided implant is present the from the heart mediastinum are not significantly changed. No focal infiltrate pleural effusion, or pneumothorax. IMPRESSION: No active disease. Electronically Signed   By: Maude Naegeli M.D.   On: 06/26/2024 09:36     Procedures   Medications Ordered in the ED  lactated ringers  infusion ( Intravenous New Bag/Given 06/26/24 1146)  cefTRIAXone  (ROCEPHIN ) 1 g in sodium chloride  0.9 % 100 mL IVPB (has no administration in time range)  azithromycin   (ZITHROMAX ) tablet 500 mg (has no administration in time range)  lactated ringers  bolus 500 mL (0 mLs Intravenous Stopped 06/26/24 1052)  iohexol  (OMNIPAQUE ) 300 MG/ML solution 100 mL (100 mLs Intravenous Contrast Given 06/26/24 1105)                                    Medical Decision Making Amount and/or Complexity of Data Reviewed External Data Reviewed: notes. Labs: ordered. Decision-making details documented in ED Course. Radiology: ordered and independent interpretation performed. Decision-making details documented in ED Course. ECG/medicine tests: ordered and independent interpretation performed. Decision-making details documented in ED Course.  Risk Prescription drug management.   Pt with multiple medical problems and comorbidities and presenting today with a complaint that caries a high risk for morbidity and mortality.  Here today with fever of unknown origin and concern for possible sepsis.  Patient is very well-appearing at this time.  There is no focal findings and he denies any tick borne exposures.  Vital signs are reassuring at this time.  Concern for bacteremia versus viral etiology versus recurrent pneumonia versus endocarditis.  Patient did have an echo done in December of last year that did not show any major issues or vegetations on his valves. CMP from yesterday was normal, CBC with mild leukocytosis and negative viral studies.  Will get blood cultures, lactate, repeat CBC and urine. I have independently visualized and interpreted pt's images today. Chest x-ray without acute findings.  CT however shows a left lobe pneumonia in a different location than when he had pneumonia in October.  I independently interpreted patient's labs and lactate is mildly elevated at 2.2 and 2 CBC with persistent white count of 12 and negative urine.  Patient is well-appearing at this time and feel that he would be a candidate to go home for ongoing oral antibiotics.  He and his wife feel  comfortable with this and would return if his symptoms worsened.  He was given a dose of Rocephin  and azithromycin  here and we will send  him home with omicef and doxy as it appears pt is still on amiodarone       Final diagnoses:  Community acquired pneumonia of left upper lobe of lung    ED Discharge Orders          Ordered    azithromycin  (ZITHROMAX ) 250 MG tablet  Daily        06/26/24 1302    cefdinir  (OMNICEF ) 300 MG capsule  2 times daily        06/26/24 1302               Doretha Folks, MD 06/26/24 1303    Doretha Folks, MD 06/26/24 1318    Doretha Folks, MD 06/26/24 1318

## 2024-07-01 LAB — CULTURE, BLOOD (ROUTINE X 2)
Culture: NO GROWTH
Culture: NO GROWTH
Special Requests: ADEQUATE
Special Requests: ADEQUATE
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
## 2024-07-02 ENCOUNTER — Ambulatory Visit: Attending: Student

## 2024-07-02 ENCOUNTER — Ambulatory Visit: Attending: Internal Medicine | Admitting: Internal Medicine

## 2024-07-02 ENCOUNTER — Encounter: Payer: Self-pay | Admitting: Internal Medicine

## 2024-07-02 VITALS — BP 120/72 | HR 72 | Ht 65.0 in | Wt 155.0 lb

## 2024-07-02 DIAGNOSIS — M6281 Muscle weakness (generalized): Secondary | ICD-10-CM | POA: Diagnosis not present

## 2024-07-02 DIAGNOSIS — M5459 Other low back pain: Secondary | ICD-10-CM | POA: Insufficient documentation

## 2024-07-02 DIAGNOSIS — M25552 Pain in left hip: Secondary | ICD-10-CM | POA: Diagnosis not present

## 2024-07-02 DIAGNOSIS — R2689 Other abnormalities of gait and mobility: Secondary | ICD-10-CM | POA: Diagnosis not present

## 2024-07-02 DIAGNOSIS — I442 Atrioventricular block, complete: Secondary | ICD-10-CM

## 2024-07-02 DIAGNOSIS — R293 Abnormal posture: Secondary | ICD-10-CM | POA: Diagnosis not present

## 2024-07-02 DIAGNOSIS — M25551 Pain in right hip: Secondary | ICD-10-CM | POA: Insufficient documentation
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
## 2024-07-02 NOTE — Patient Instructions (Signed)
 Medication Instructions:  Your physician recommends that you continue on your current medications as directed. Please refer to the Current Medication list given to you today.  *If you need a refill on your cardiac medications before your next appointment, please call your pharmacy*  Lab Work: None ordered.  You may go to any [facility] Location for your lab work:  [facility] - [address] ([facility]) - [address] [id number] [address]  [place] - [address]   [place]  - [address]   [place] - [address] - [address] [employer]  - [address] - [address] ([facility]   If you have labs (blood work) drawn today and your tests are completely normal, you will receive your results only by: [employer] (if you have MyChart)  If you have any lab test that is abnormal or we need to change your treatment, we will call you or send a MyChart message to review the results.  Testing/Procedures: None ordered.  Follow-Up: At [facility], you and your health needs are our priority.  As part of our continuing mission to provide you with exceptional heart care, we have created designated Provider Care Teams.  These Care Teams include your primary Cardiologist (physician) and Advanced Practice Providers (APPs -  Physician Assistants and Nurse Practitioners) who all work together to provide you with the care you need, when you need it.  Your next appointment:   1 year(s)  The format for your next appointment:   In Person  Provider:   Manya Sells, MD{or one of the following Advanced Practice Providers on your designated Care Team:   Benjamin Fuller, [place] Benjamin Fuller "Benjamin Fuller" [place], [place] Benjamin Balk, NP  Note: Remote monitoring is used to monitor your Pacemaker/ ICD from home. This monitoring reduces the number of office visits required to check your  device to one time per year. It allows us  to keep an eye on the functioning of your device to ensure it is working properly.

## 2024-07-06 DIAGNOSIS — J181 Lobar pneumonia, unspecified organism: Secondary | ICD-10-CM | POA: Diagnosis not present

## 2024-07-07 ENCOUNTER — Encounter: Payer: Self-pay | Admitting: Physical Therapy

## 2024-07-07 ENCOUNTER — Ambulatory Visit: Admitting: Physical Therapy

## 2024-07-07 DIAGNOSIS — R293 Abnormal posture: Secondary | ICD-10-CM | POA: Diagnosis not present

## 2024-07-07 DIAGNOSIS — M25551 Pain in right hip: Secondary | ICD-10-CM | POA: Diagnosis not present

## 2024-07-07 DIAGNOSIS — R2689 Other abnormalities of gait and mobility: Secondary | ICD-10-CM | POA: Diagnosis not present

## 2024-07-07 DIAGNOSIS — M25552 Pain in left hip: Secondary | ICD-10-CM | POA: Diagnosis not present

## 2024-07-07 DIAGNOSIS — M6281 Muscle weakness (generalized): Secondary | ICD-10-CM | POA: Diagnosis not present

## 2024-07-07 DIAGNOSIS — M5459 Other low back pain: Secondary | ICD-10-CM
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
## 2024-07-09 ENCOUNTER — Ambulatory Visit: Admitting: Physical Therapy

## 2024-07-09 DIAGNOSIS — M5459 Other low back pain: Secondary | ICD-10-CM | POA: Diagnosis not present

## 2024-07-09 DIAGNOSIS — R2689 Other abnormalities of gait and mobility: Secondary | ICD-10-CM | POA: Diagnosis not present

## 2024-07-09 DIAGNOSIS — R293 Abnormal posture: Secondary | ICD-10-CM

## 2024-07-09 DIAGNOSIS — M25552 Pain in left hip: Secondary | ICD-10-CM | POA: Diagnosis not present

## 2024-07-09 DIAGNOSIS — M25551 Pain in right hip: Secondary | ICD-10-CM | POA: Diagnosis not present

## 2024-07-09 DIAGNOSIS — M6281 Muscle weakness (generalized): Secondary | ICD-10-CM | POA: Diagnosis not present
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
## 2024-07-10 ENCOUNTER — Ambulatory Visit (INDEPENDENT_AMBULATORY_CARE_PROVIDER_SITE_OTHER): Payer: Medicare Other

## 2024-07-10 DIAGNOSIS — I442 Atrioventricular block, complete: Secondary | ICD-10-CM | POA: Diagnosis not present

## 2024-07-11 LAB — CUP PACEART REMOTE DEVICE CHECK
Battery Remaining Longevity: 114 mo
Battery Remaining Percentage: 100 %
Brady Statistic RA Percent Paced: 7 %
Brady Statistic RV Percent Paced: 100 %
Date Time Interrogation Session: 20250711024100
Implantable Lead Connection Status: 753985
Implantable Lead Connection Status: 753985
Implantable Lead Implant Date: 20221222
Implantable Lead Implant Date: 20221222
Implantable Lead Location: 753859
Implantable Lead Location: 753860
Implantable Lead Model: 7841
Implantable Lead Model: 7842
Implantable Lead Serial Number: 1102393
Implantable Lead Serial Number: 1161013
Implantable Pulse Generator Implant Date: 20221222
Lead Channel Impedance Value: 680 Ohm
Lead Channel Impedance Value: 693 Ohm
Lead Channel Pacing Threshold Amplitude: 0.5 V
Lead Channel Pacing Threshold Pulse Width: 0.4 ms
Lead Channel Setting Pacing Amplitude: 2.5 V
Lead Channel Setting Pacing Amplitude: 2.5 V
Lead Channel Setting Pacing Pulse Width: 0.4 ms
Lead Channel Setting Sensing Sensitivity: 2.5 mV
Pulse Gen Serial Number: 562911
Zone Setting Status: 755011

## 2024-07-13 ENCOUNTER — Ambulatory Visit: Payer: Self-pay | Admitting: Internal Medicine

## 2024-07-14 ENCOUNTER — Ambulatory Visit: Admitting: Physical Therapy

## 2024-07-14 ENCOUNTER — Encounter: Payer: Self-pay | Admitting: Physical Therapy

## 2024-07-14 DIAGNOSIS — M25551 Pain in right hip: Secondary | ICD-10-CM

## 2024-07-14 DIAGNOSIS — R293 Abnormal posture: Secondary | ICD-10-CM

## 2024-07-14 DIAGNOSIS — M25552 Pain in left hip: Secondary | ICD-10-CM | POA: Diagnosis not present

## 2024-07-14 DIAGNOSIS — R2689 Other abnormalities of gait and mobility: Secondary | ICD-10-CM

## 2024-07-14 DIAGNOSIS — M6281 Muscle weakness (generalized): Secondary | ICD-10-CM | POA: Diagnosis not present

## 2024-07-14 DIAGNOSIS — M5459 Other low back pain: Secondary | ICD-10-CM | POA: Diagnosis not present
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
## 2024-07-21 ENCOUNTER — Ambulatory Visit: Admitting: Physical Therapy

## 2024-07-21 DIAGNOSIS — M25552 Pain in left hip: Secondary | ICD-10-CM | POA: Diagnosis not present

## 2024-07-21 DIAGNOSIS — M5459 Other low back pain: Secondary | ICD-10-CM

## 2024-07-21 DIAGNOSIS — R293 Abnormal posture: Secondary | ICD-10-CM | POA: Diagnosis not present

## 2024-07-21 DIAGNOSIS — M6281 Muscle weakness (generalized): Secondary | ICD-10-CM | POA: Diagnosis not present

## 2024-07-21 DIAGNOSIS — M25551 Pain in right hip: Secondary | ICD-10-CM

## 2024-07-21 DIAGNOSIS — R2689 Other abnormalities of gait and mobility: Secondary | ICD-10-CM | POA: Diagnosis not present
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
## 2024-07-23 ENCOUNTER — Ambulatory Visit: Admitting: Physical Therapy

## 2024-07-27 DIAGNOSIS — M25551 Pain in right hip: Secondary | ICD-10-CM | POA: Diagnosis not present

## 2024-07-27 DIAGNOSIS — M25552 Pain in left hip: Secondary | ICD-10-CM | POA: Diagnosis not present

## 2024-07-28 ENCOUNTER — Ambulatory Visit: Admitting: Physical Therapy

## 2024-07-30 ENCOUNTER — Ambulatory Visit: Admitting: Physical Therapy

## 2024-07-30 ENCOUNTER — Encounter: Payer: Self-pay | Admitting: Physical Therapy

## 2024-07-30 DIAGNOSIS — R293 Abnormal posture: Secondary | ICD-10-CM

## 2024-07-30 DIAGNOSIS — M6281 Muscle weakness (generalized): Secondary | ICD-10-CM

## 2024-07-30 DIAGNOSIS — M25552 Pain in left hip: Secondary | ICD-10-CM | POA: Diagnosis not present

## 2024-07-30 DIAGNOSIS — R2689 Other abnormalities of gait and mobility: Secondary | ICD-10-CM | POA: Diagnosis not present

## 2024-07-30 DIAGNOSIS — M5459 Other low back pain: Secondary | ICD-10-CM

## 2024-07-30 DIAGNOSIS — M25551 Pain in right hip: Secondary | ICD-10-CM | POA: Diagnosis not present
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
## 2024-08-04 ENCOUNTER — Ambulatory Visit: Attending: Family Medicine | Admitting: Physical Therapy

## 2024-08-04 ENCOUNTER — Encounter: Payer: Self-pay | Admitting: Physical Therapy

## 2024-08-04 DIAGNOSIS — M25551 Pain in right hip: Secondary | ICD-10-CM | POA: Insufficient documentation

## 2024-08-04 DIAGNOSIS — M6281 Muscle weakness (generalized): Secondary | ICD-10-CM | POA: Insufficient documentation

## 2024-08-04 DIAGNOSIS — R293 Abnormal posture: Secondary | ICD-10-CM | POA: Diagnosis not present

## 2024-08-04 DIAGNOSIS — M25552 Pain in left hip: Secondary | ICD-10-CM | POA: Insufficient documentation

## 2024-08-04 DIAGNOSIS — M5459 Other low back pain: Secondary | ICD-10-CM | POA: Insufficient documentation

## 2024-08-04 DIAGNOSIS — M545 Low back pain, unspecified: Secondary | ICD-10-CM | POA: Insufficient documentation

## 2024-08-04 DIAGNOSIS — R2689 Other abnormalities of gait and mobility: Secondary | ICD-10-CM | POA: Diagnosis not present
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
## 2024-08-06 ENCOUNTER — Ambulatory Visit: Admitting: Physical Therapy

## 2024-08-06 DIAGNOSIS — M25552 Pain in left hip: Secondary | ICD-10-CM | POA: Diagnosis not present

## 2024-08-06 DIAGNOSIS — M6281 Muscle weakness (generalized): Secondary | ICD-10-CM | POA: Diagnosis not present

## 2024-08-06 DIAGNOSIS — R2689 Other abnormalities of gait and mobility: Secondary | ICD-10-CM | POA: Diagnosis not present

## 2024-08-06 DIAGNOSIS — M25551 Pain in right hip: Secondary | ICD-10-CM | POA: Diagnosis not present

## 2024-08-06 DIAGNOSIS — R293 Abnormal posture: Secondary | ICD-10-CM | POA: Diagnosis not present

## 2024-08-06 DIAGNOSIS — M545 Low back pain, unspecified: Secondary | ICD-10-CM | POA: Diagnosis not present

## 2024-08-06 DIAGNOSIS — M5459 Other low back pain: Secondary | ICD-10-CM | POA: Diagnosis not present
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
## 2024-08-11 ENCOUNTER — Encounter: Payer: Self-pay | Admitting: Physical Therapy

## 2024-08-11 ENCOUNTER — Ambulatory Visit: Admitting: Physical Therapy

## 2024-08-11 DIAGNOSIS — M5459 Other low back pain: Secondary | ICD-10-CM | POA: Diagnosis not present

## 2024-08-11 DIAGNOSIS — M25552 Pain in left hip: Secondary | ICD-10-CM | POA: Diagnosis not present

## 2024-08-11 DIAGNOSIS — M545 Low back pain, unspecified: Secondary | ICD-10-CM | POA: Diagnosis not present

## 2024-08-11 DIAGNOSIS — R293 Abnormal posture: Secondary | ICD-10-CM

## 2024-08-11 DIAGNOSIS — R2689 Other abnormalities of gait and mobility: Secondary | ICD-10-CM | POA: Diagnosis not present

## 2024-08-11 DIAGNOSIS — M6281 Muscle weakness (generalized): Secondary | ICD-10-CM

## 2024-08-11 DIAGNOSIS — M25551 Pain in right hip: Secondary | ICD-10-CM
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
## 2024-08-12 DIAGNOSIS — E119 Type 2 diabetes mellitus without complications: Secondary | ICD-10-CM | POA: Diagnosis not present

## 2024-08-13 ENCOUNTER — Ambulatory Visit: Admitting: Physical Therapy

## 2024-08-13 DIAGNOSIS — M25551 Pain in right hip: Secondary | ICD-10-CM

## 2024-08-13 DIAGNOSIS — M6281 Muscle weakness (generalized): Secondary | ICD-10-CM | POA: Diagnosis not present

## 2024-08-13 DIAGNOSIS — M5459 Other low back pain: Secondary | ICD-10-CM

## 2024-08-13 DIAGNOSIS — R293 Abnormal posture: Secondary | ICD-10-CM | POA: Diagnosis not present

## 2024-08-13 DIAGNOSIS — M545 Low back pain, unspecified: Secondary | ICD-10-CM | POA: Diagnosis not present

## 2024-08-13 DIAGNOSIS — R2689 Other abnormalities of gait and mobility: Secondary | ICD-10-CM | POA: Diagnosis not present

## 2024-08-13 DIAGNOSIS — M25552 Pain in left hip: Secondary | ICD-10-CM | POA: Diagnosis not present
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
## 2024-08-18 ENCOUNTER — Ambulatory Visit: Admitting: Physical Therapy

## 2024-08-18 DIAGNOSIS — M6281 Muscle weakness (generalized): Secondary | ICD-10-CM

## 2024-08-18 DIAGNOSIS — M545 Low back pain, unspecified: Secondary | ICD-10-CM | POA: Diagnosis not present

## 2024-08-18 DIAGNOSIS — M25552 Pain in left hip: Secondary | ICD-10-CM

## 2024-08-18 DIAGNOSIS — M25551 Pain in right hip: Secondary | ICD-10-CM | POA: Diagnosis not present

## 2024-08-18 DIAGNOSIS — M5459 Other low back pain: Secondary | ICD-10-CM | POA: Diagnosis not present

## 2024-08-18 DIAGNOSIS — R293 Abnormal posture: Secondary | ICD-10-CM

## 2024-08-18 DIAGNOSIS — R2689 Other abnormalities of gait and mobility: Secondary | ICD-10-CM | POA: Diagnosis not present
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
## 2024-08-19 ENCOUNTER — Ambulatory Visit (HOSPITAL_COMMUNITY): Payer: Self-pay | Admitting: Internal Medicine

## 2024-08-19 ENCOUNTER — Encounter (HOSPITAL_COMMUNITY): Payer: Self-pay | Admitting: Internal Medicine

## 2024-08-19 ENCOUNTER — Ambulatory Visit (HOSPITAL_COMMUNITY)
Admission: RE | Admit: 2024-08-19 | Discharge: 2024-08-19 | Disposition: A | Discharge status: Home / Self Care | Payer: Medicare PPO | Source: Ambulatory Visit | Attending: Internal Medicine | Admitting: Internal Medicine

## 2024-08-19 VITALS — BP 124/68 | HR 72 | Ht 65.0 in | Wt 167.0 lb

## 2024-08-19 DIAGNOSIS — I4819 Other persistent atrial fibrillation: Secondary | ICD-10-CM

## 2024-08-19 DIAGNOSIS — I480 Paroxysmal atrial fibrillation: Secondary | ICD-10-CM

## 2024-08-19 DIAGNOSIS — D6869 Other thrombophilia: Secondary | ICD-10-CM | POA: Diagnosis not present

## 2024-08-19 DIAGNOSIS — Z5181 Encounter for therapeutic drug level monitoring: Secondary | ICD-10-CM

## 2024-08-19 DIAGNOSIS — Z79899 Other long term (current) drug therapy: Secondary | ICD-10-CM | POA: Diagnosis not present

## 2024-08-19 MED ORDER — AMIODARONE HCL 200 MG PO TABS: ORAL_TABLET | ORAL | 3 refills | Status: AC

## 2024-08-19 NOTE — Patient Instructions (Addendum)
 Start amiodarone  200 mg twice a day x 30 days the decrease to 200 mg once a day
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
## 2024-08-20 ENCOUNTER — Ambulatory Visit: Admitting: Physical Therapy

## 2024-08-20 DIAGNOSIS — M25551 Pain in right hip: Secondary | ICD-10-CM | POA: Diagnosis not present

## 2024-08-20 DIAGNOSIS — M6281 Muscle weakness (generalized): Secondary | ICD-10-CM

## 2024-08-20 DIAGNOSIS — M5459 Other low back pain: Secondary | ICD-10-CM

## 2024-08-20 DIAGNOSIS — R293 Abnormal posture: Secondary | ICD-10-CM | POA: Diagnosis not present

## 2024-08-20 DIAGNOSIS — R2689 Other abnormalities of gait and mobility: Secondary | ICD-10-CM | POA: Diagnosis not present

## 2024-08-20 DIAGNOSIS — M25552 Pain in left hip: Secondary | ICD-10-CM | POA: Diagnosis not present

## 2024-08-20 DIAGNOSIS — M545 Low back pain, unspecified: Secondary | ICD-10-CM | POA: Diagnosis not present
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
## 2024-08-25 ENCOUNTER — Ambulatory Visit: Admitting: Physical Therapy

## 2024-08-25 DIAGNOSIS — M545 Low back pain, unspecified: Secondary | ICD-10-CM | POA: Diagnosis not present

## 2024-08-25 DIAGNOSIS — R293 Abnormal posture: Secondary | ICD-10-CM

## 2024-08-25 DIAGNOSIS — M25551 Pain in right hip: Secondary | ICD-10-CM | POA: Diagnosis not present

## 2024-08-25 DIAGNOSIS — M6281 Muscle weakness (generalized): Secondary | ICD-10-CM | POA: Diagnosis not present

## 2024-08-25 DIAGNOSIS — M25552 Pain in left hip: Secondary | ICD-10-CM | POA: Diagnosis not present

## 2024-08-25 DIAGNOSIS — M5459 Other low back pain: Secondary | ICD-10-CM | POA: Diagnosis not present

## 2024-08-25 DIAGNOSIS — R2689 Other abnormalities of gait and mobility: Secondary | ICD-10-CM | POA: Diagnosis not present

## 2024-08-25 LAB — COMPREHENSIVE METABOLIC PANEL WITH GFR
ALT: 32 IU/L (ref 0–50)
AST: 27 IU/L (ref 15–51)
Albumin: 3.9 g/dL (ref 3.7–4.7)
Alkaline Phosphatase: 88 IU/L (ref 44–121)
BUN/Creatinine Ratio: 22 (ref 10–24)
BUN: 18 mg/dL (ref 8–27)
Bilirubin Total: 0.8 mg/dL (ref 0.0–1.2)
CO2: 22 mmol/L (ref 20–29)
Calcium: 8.9 mg/dL (ref 8.6–10.2)
Chloride: 105 mmol/L (ref 96–106)
Creatinine, Ser: 0.82 mg/dL (ref 0.76–1.27)
Globulin, Total: 2.2 g/dL (ref 1.5–4.5)
Glucose: 226 mg/dL — ABNORMAL HIGH (ref 70–99)
Potassium: 4.4 mmol/L (ref 3.5–5.2)
Sodium: 139 mmol/L (ref 134–144)
Total Protein: 6.1 g/dL (ref 6.0–8.5)
eGFR: 88 mL/min/1.73 (ref >59)

## 2024-08-25 LAB — CBC
Hematocrit: 40.6 % (ref 37.5–51.0)
Hemoglobin: 13 g/dL (ref 13.0–17.7)
MCH: 30.6 pg (ref 26.6–33.0)
MCHC: 32 g/dL (ref 31.5–35.7)
MCV: 96 fL (ref 79–97)
Platelets: 173 x10E3/uL (ref 150–450)
RBC: 4.25 x10E6/uL (ref 4.14–5.80)
RDW: 13.9 % (ref 11.6–15.4)
WBC: 7 x10E3/uL (ref 3.4–10.8)

## 2024-08-25 LAB — TSH: TSH: 2 u[IU]/mL (ref 0.450–4.500)
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
## 2024-08-27 ENCOUNTER — Ambulatory Visit: Admitting: Physical Therapy

## 2024-08-27 DIAGNOSIS — M25552 Pain in left hip: Secondary | ICD-10-CM | POA: Diagnosis not present

## 2024-08-27 DIAGNOSIS — M545 Low back pain, unspecified: Secondary | ICD-10-CM | POA: Diagnosis not present

## 2024-08-27 DIAGNOSIS — R2689 Other abnormalities of gait and mobility: Secondary | ICD-10-CM | POA: Diagnosis not present

## 2024-08-27 DIAGNOSIS — M6281 Muscle weakness (generalized): Secondary | ICD-10-CM

## 2024-08-27 DIAGNOSIS — M25551 Pain in right hip: Secondary | ICD-10-CM

## 2024-08-27 DIAGNOSIS — R293 Abnormal posture: Secondary | ICD-10-CM | POA: Diagnosis not present

## 2024-08-27 DIAGNOSIS — M5459 Other low back pain: Secondary | ICD-10-CM
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
## 2024-09-09 ENCOUNTER — Ambulatory Visit (HOSPITAL_COMMUNITY)
Admission: RE | Admit: 2024-09-09 | Discharge: 2024-09-09 | Disposition: A | Discharge status: Home / Self Care | Source: Ambulatory Visit | Attending: Internal Medicine | Admitting: Internal Medicine

## 2024-09-09 ENCOUNTER — Encounter (HOSPITAL_COMMUNITY): Payer: Self-pay | Admitting: Internal Medicine

## 2024-09-09 VITALS — BP 148/74 | HR 64 | Ht 65.0 in | Wt 165.0 lb

## 2024-09-09 DIAGNOSIS — I4819 Other persistent atrial fibrillation: Secondary | ICD-10-CM | POA: Diagnosis not present

## 2024-09-09 DIAGNOSIS — Z79899 Other long term (current) drug therapy: Secondary | ICD-10-CM

## 2024-09-09 DIAGNOSIS — D6869 Other thrombophilia: Secondary | ICD-10-CM | POA: Diagnosis not present

## 2024-09-09 DIAGNOSIS — Z5181 Encounter for therapeutic drug level monitoring: Secondary | ICD-10-CM | POA: Diagnosis not present

## 2024-09-09 NOTE — Addendum Note (Signed)
 Encounter addended by: Janel Nancy SAUNDERS, RN on: 09/09/2024 10:56 AM  Actions taken: Order list changed, Diagnosis association updated

## 2024-09-09 NOTE — Patient Instructions (Addendum)
 Hold 09/12/24 JARDIANCE 25 MG TABS  may resume after procedure   Cardioversion scheduled for: 09/16/24 [facility] AT 12.00PM   - Arrive at the [employer] [facility] ([address]  and check in with ADMITTING at12:00   - Do not eat or drink anything after midnight the night prior to your procedure.   - Take all your morning medication (except diabetic medications) with a sip of water prior to arrival.  - Do NOT miss any doses of your blood thinner - if you should miss a dose or take a dose more than 4 hours late -- please notify our office immediately.  - You will not be able to drive home after your procedure. Please ensure you have a responsible adult to drive you home. You will need someone with you for 24 hours post procedure.     - Expect to be in the procedural area approximately 2 hours.   - If you feel as if you go back into normal rhythm prior to scheduled cardioversion, please notify our office immediately.   If your procedure is canceled in the cardioversion suite you will be charged a cancellation fee.      Hold below medications 72 hours prior to scheduled procedure/anesthesia. Restart medication on the following day after scheduled procedure/anesthesia Empagliflozin (Jardiance)     For those patients who have a scheduled procedure/anesthesia on the same day of the week as their dose, hold the medication on the day of surgery.  They can take their scheduled dose the week before.  **Patients on the above medications scheduled for elective procedures that have not held the medication for the appropriate amount of time are at risk of cancellation or change in the anesthetic plan.
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
## 2024-09-09 NOTE — Addendum Note (Signed)
 Encounter addended by: Janel Nancy SAUNDERS, RN on: 09/09/2024 10:45 AM  Actions taken: Clinical Note Signed

## 2024-09-15 NOTE — Progress Notes (Signed)
 Spoke to patient and instructed them to come at 1100  and to be NPO after 0000.     Confirmed that patient will have a ride home and someone to stay with them for 24 hours after the procedure.   Medications reviewed.  Confirmed blood thinner.  Confirmed no breaks in taking blood thinner for 3+ weeks prior to procedure. Confirmed patient stopped all GLP-1s and GLP-2s for at least one week before procedure.

## 2024-09-16 ENCOUNTER — Encounter (HOSPITAL_COMMUNITY): Payer: Self-pay | Admitting: Anesthesiology

## 2024-09-16 ENCOUNTER — Encounter (HOSPITAL_COMMUNITY)
Admission: RE | Disposition: A | Discharge status: Home / Self Care | Payer: Self-pay | Source: Home / Self Care | Attending: Cardiovascular Disease

## 2024-09-16 ENCOUNTER — Ambulatory Visit (HOSPITAL_COMMUNITY)
Admission: RE | Admit: 2024-09-16 | Discharge: 2024-09-16 | Disposition: A | Discharge status: Home / Self Care | Attending: Cardiovascular Disease | Admitting: Cardiovascular Disease

## 2024-09-16 DIAGNOSIS — I4819 Other persistent atrial fibrillation: Secondary | ICD-10-CM | POA: Diagnosis not present

## 2024-09-16 DIAGNOSIS — Z539 Procedure and treatment not carried out, unspecified reason: Secondary | ICD-10-CM | POA: Insufficient documentation

## 2024-09-16 SURGERY — CARDIOVERSION (CATH LAB)
Anesthesia: General

## 2024-09-16 MED ORDER — SODIUM CHLORIDE 0.9 % IV SOLN: INTRAVENOUS | Status: DC

## 2024-09-16 NOTE — H&P (Signed)
 Procedure canceled.

## 2024-09-16 NOTE — Progress Notes (Signed)
 Converted to A sensed (sinus) V paced rhythm. Cardioversion canceled.  Note farfield R wave oversensing, will need adjustment of PVAB.

## 2024-09-30 ENCOUNTER — Telehealth: Payer: Self-pay

## 2024-09-30 ENCOUNTER — Encounter (HOSPITAL_COMMUNITY): Payer: Self-pay | Admitting: Internal Medicine

## 2024-09-30 ENCOUNTER — Ambulatory Visit (HOSPITAL_COMMUNITY)
Admission: RE | Admit: 2024-09-30 | Discharge: 2024-09-30 | Disposition: A | Discharge status: Home / Self Care | Source: Ambulatory Visit | Attending: Internal Medicine | Admitting: Internal Medicine

## 2024-09-30 VITALS — BP 128/72 | HR 68 | Ht 65.0 in | Wt 167.0 lb

## 2024-09-30 DIAGNOSIS — Z5181 Encounter for therapeutic drug level monitoring: Secondary | ICD-10-CM

## 2024-09-30 DIAGNOSIS — Z79899 Other long term (current) drug therapy: Secondary | ICD-10-CM

## 2024-09-30 DIAGNOSIS — I4819 Other persistent atrial fibrillation: Secondary | ICD-10-CM | POA: Diagnosis not present

## 2024-09-30 DIAGNOSIS — D6869 Other thrombophilia: Secondary | ICD-10-CM | POA: Diagnosis not present

## 2024-09-30 NOTE — Telephone Encounter (Signed)
 Manual PPM transmission received and reviewed. Presenting: AS/VP 70s (sinus rhythm). Intermittent FFRW oversensing noted. No AT/AF episodes recorded since 08/14/24; available EGMs c/w AT.        Spoke with patient to relay results. Discussed alert settings; atrial arrhythmia burden alert programmed to trigger for >24 hrs in a 24 hour period. Advised will route note to J. Suarez, PA-C, for recommendations. Pt verbalizes understanding and agreement with plan.

## 2024-09-30 NOTE — Telephone Encounter (Signed)
 Pt called in wanting to know if he is afib per Terra in afib clinic. Pt has sent transmission

## 2024-09-30 NOTE — Patient Instructions (Signed)
Device Clinic: [phone] 
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
## 2024-10-08 NOTE — Progress Notes (Signed)
 Remote PPM Transmission

## 2024-10-09 ENCOUNTER — Ambulatory Visit

## 2024-10-09 DIAGNOSIS — I442 Atrioventricular block, complete: Secondary | ICD-10-CM | POA: Diagnosis not present

## 2024-10-09 LAB — CUP PACEART REMOTE DEVICE CHECK
Battery Remaining Longevity: 120 mo
Battery Remaining Percentage: 100 %
Brady Statistic RA Percent Paced: 5 %
Brady Statistic RV Percent Paced: 100 %
Date Time Interrogation Session: 20251010024100
Implantable Lead Connection Status: 753985
Implantable Lead Connection Status: 753985
Implantable Lead Implant Date: 20221222
Implantable Lead Implant Date: 20221222
Implantable Lead Location: 753859
Implantable Lead Location: 753860
Implantable Lead Model: 7841
Implantable Lead Model: 7842
Implantable Lead Serial Number: 1102393
Implantable Lead Serial Number: 1161013
Implantable Pulse Generator Implant Date: 20221222
Lead Channel Impedance Value: 616 Ohm
Lead Channel Impedance Value: 679 Ohm
Lead Channel Pacing Threshold Amplitude: 0.5 V
Lead Channel Pacing Threshold Pulse Width: 0.4 ms
Lead Channel Setting Pacing Amplitude: 2.5 V
Lead Channel Setting Pacing Amplitude: 2.5 V
Lead Channel Setting Pacing Pulse Width: 0.4 ms
Lead Channel Setting Sensing Sensitivity: 2.5 mV
Pulse Gen Serial Number: 562911
Zone Setting Status: 755011

## 2024-10-12 NOTE — Progress Notes (Signed)
 Remote PPM Transmission

## 2024-10-15 ENCOUNTER — Ambulatory Visit: Payer: Self-pay | Admitting: Internal Medicine

## 2024-10-19 ENCOUNTER — Other Ambulatory Visit (HOSPITAL_COMMUNITY): Payer: Self-pay | Admitting: Physician Assistant

## 2024-10-19 DIAGNOSIS — M25552 Pain in left hip: Secondary | ICD-10-CM

## 2024-10-19 DIAGNOSIS — M25551 Pain in right hip: Secondary | ICD-10-CM

## 2024-11-02 ENCOUNTER — Encounter (HOSPITAL_BASED_OUTPATIENT_CLINIC_OR_DEPARTMENT_OTHER): Payer: Self-pay | Admitting: Family

## 2024-11-02 ENCOUNTER — Ambulatory Visit (INDEPENDENT_AMBULATORY_CARE_PROVIDER_SITE_OTHER): Admitting: Family

## 2024-11-02 VITALS — BP 122/56 | HR 78 | Ht 65.0 in | Wt 169.1 lb

## 2024-11-02 DIAGNOSIS — D6859 Other primary thrombophilia: Secondary | ICD-10-CM

## 2024-11-02 DIAGNOSIS — I10 Essential (primary) hypertension: Secondary | ICD-10-CM

## 2024-11-02 DIAGNOSIS — I480 Paroxysmal atrial fibrillation: Secondary | ICD-10-CM | POA: Diagnosis not present

## 2024-11-02 DIAGNOSIS — E782 Mixed hyperlipidemia: Secondary | ICD-10-CM

## 2024-11-02 NOTE — Progress Notes (Signed)
 Cardiology Office Note   Date:  11/02/2024  ID:  Benjamin Fuller, Benjamin Fuller [birth year], MRN [id number] PCP: Marvene Prentice SAUNDERS, FNP  [facility] Providers Cardiologist:  Shelda Bruckner, MD Electrophysiologist:  Danelle Birmingham, MD     History of Present Illness Benjamin Fuller is a 83 y.o. male with hx of DMI2, HTN, HLD, PAF s/p ablation 03/30/21, symptomatic bradycardia and CHB s/p PPM 11/2021.   CT prior to ablation with calcium score 353. He has had several cardioversion 07/2023 and 11/2023.   Presents today for follow up independently. Feeling well since last seen. Has not needed PRN Lasix  in 2 months. Reports rare LE edema. Reports no shortness of breath nor dyspnea on exertion. Reports no chest pain, pressure, or tightness. No edema, orthopnea, PND. Reports no palpitations. Previous bouts of atrial fib were symptomatic with significant fatigue, has not recurred.  Activity limited by  bilateral hip pain, completed PT with some improvement, has MRI scheduled for further evaluation.  ROS: Please see the history of present illness.    All other systems reviewed and are negative.   Studies Reviewed      Cardiac Studies & Procedures   ______________________________________________________________________________________________     ECHOCARDIOGRAM  ECHOCARDIOGRAM COMPLETE 12/09/2023  Narrative ECHOCARDIOGRAM REPORT    Patient Name:   Benjamin Fuller Date of Exam: 12/09/2023 Medical Rec #:  [id number]       Height:       65.0 in Accession #:    [id number]      Weight:       170.6 lb Date of Birth:  12/05/1941       BSA:          1.849 m Patient Age:    82 years        BP:           124/60 mmHg Patient Gender: M               HR:           79 bpm. Exam Location:  Outpatient  Procedure: 2D Echo, 3D Echo, Color Doppler, Cardiac Doppler and Strain Analysis  Indications:    Lower extremity edema, sob  History:        Patient has prior history of Echocardiogram examinations,  most recent 03/02/2021. Pacemaker, Arrythmias:Atrial Fibrillation; Risk Factors:Hypertension, Diabetes, Former Smoker and Dyslipidemia.  Sonographer:    Orvil Holmes RDCS Referring Phys: [id number] KATLYN D WEST  IMPRESSIONS   1. Left ventricular ejection fraction, by estimation, is 55 to 60%. The left ventricle has normal function. The left ventricle has no regional wall motion abnormalities. There is mild left ventricular hypertrophy. Left ventricular diastolic parameters are consistent with Grade II diastolic dysfunction (pseudonormalization). 2. Right ventricular systolic function is normal. The right ventricular size is normal. There is mildly elevated pulmonary artery systolic pressure. 3. Left atrial size was moderately dilated. 4. Right atrial size was mildly dilated. 5. The mitral valve is normal in structure. Mild mitral valve regurgitation. No evidence of mitral stenosis. 6. The aortic valve is tricuspid. Aortic valve regurgitation is not visualized. No aortic stenosis is present. 7. Aortic dilatation noted. There is mild dilatation of the ascending aorta, measuring 40 mm. 8. The inferior vena cava is normal in size with greater than 50% respiratory variability, suggesting right atrial pressure of 3 mmHg.  Comparison(s): No significant change from prior study.  FINDINGS Left Ventricle: Left ventricular ejection fraction, by estimation, is 55 to 60%. The left ventricle  has normal function. The left ventricle has no regional wall motion abnormalities. Global longitudinal strain performed but not reported based on interpreter judgement due to suboptimal tracking. The left ventricular internal cavity size was normal in size. There is mild left ventricular hypertrophy. Left ventricular diastolic parameters are consistent with Grade II diastolic dysfunction (pseudonormalization).  Right Ventricle: The right ventricular size is normal. No increase in right ventricular wall thickness.  Right ventricular systolic function is normal. There is mildly elevated pulmonary artery systolic pressure. The tricuspid regurgitant velocity is 3.03 m/s, and with an assumed right atrial pressure of 3 mmHg, the estimated right ventricular systolic pressure is 39.7 mmHg.  Left Atrium: Left atrial size was moderately dilated.  Right Atrium: Right atrial size was mildly dilated.  Pericardium: There is no evidence of pericardial effusion.  Mitral Valve: The mitral valve is normal in structure. Mild mitral valve regurgitation. No evidence of mitral valve stenosis.  Tricuspid Valve: The tricuspid valve is normal in structure. Tricuspid valve regurgitation is mild . No evidence of tricuspid stenosis.  Aortic Valve: The aortic valve is tricuspid. Aortic valve regurgitation is not visualized. No aortic stenosis is present.  Pulmonic Valve: The pulmonic valve was not well visualized. Pulmonic valve regurgitation is trivial. No evidence of pulmonic stenosis.  Aorta: Aortic dilatation noted. There is mild dilatation of the ascending aorta, measuring 40 mm.  Venous: The inferior vena cava is normal in size with greater than 50% respiratory variability, suggesting right atrial pressure of 3 mmHg.  IAS/Shunts: The atrial septum is grossly normal.   LEFT VENTRICLE PLAX 2D LVIDd:         3.80 cm   Diastology LVIDs:         2.66 cm   LV e' medial:    4.57 cm/s LV PW:         1.40 cm   LV E/e' medial:  20.7 LV IVS:        1.60 cm   LV e' lateral:   6.85 cm/s LVOT diam:     2.20 cm   LV E/e' lateral: 13.8 LV SV:         60 LV SV Index:   32 LVOT Area:     3.80 cm  3D Volume EF: 3D EF:        55 % LV EDV:       148 ml LV ESV:       67 ml LV SV:        81 ml  RIGHT VENTRICLE RV Basal diam:  3.48 cm RV Mid diam:    3.23 cm RV S prime:     13.75 cm/s TAPSE (M-mode): 2.5 cm  LEFT ATRIUM             Index        RIGHT ATRIUM           Index LA diam:        4.10 cm 2.22 cm/m   RA Area:      17.80 cm LA Vol (A2C):   67.4 ml 36.45 ml/m  RA Volume:   46.00 ml  24.88 ml/m LA Vol (A4C):   67.4 ml 36.45 ml/m LA Biplane Vol: 68.5 ml 37.05 ml/m AORTIC VALVE LVOT Vmax:   76.50 cm/s LVOT Vmean:  51.200 cm/s LVOT VTI:    0.158 m  AORTA Ao Root diam: 3.80 cm Ao Asc diam:  4.00 cm  MITRAL VALVE  TRICUSPID VALVE MV Area (PHT): 4.21 cm    TR Peak grad:   36.7 mmHg MV Decel Time: 180 msec    TR Vmax:        303.00 cm/s MR Peak grad: 65.6 mmHg MR Mean grad: 64.0 mmHg    SHUNTS MR Vmax:      405.00 cm/s  Systemic VTI:  0.16 m MR Vmean:     386.0 cm/s   Systemic Diam: 2.20 cm MV E velocity: 94.60 cm/s MV A velocity: 53.80 cm/s MV E/A ratio:  1.76  Shelda Bruckner MD Electronically signed by Shelda Bruckner MD Signature Date/Time: 12/10/2023/3:08:06 PM    Final          ______________________________________________________________________________________________      Risk Assessment/Calculations  CHA2DS2-VASc Score = 5   This indicates a 7.2% annual risk of stroke. The patient's score is based upon: CHF History: 0 HTN History: 1 Diabetes History: 1 Stroke History: 0 Vascular Disease History: 1 Age Score: 2 Gender Score: 0            Physical Exam VS:  BP (!) 122/56 (BP Location: Left Arm, Patient Position: Sitting, Cuff Size: Normal)   Pulse 78   Ht 5' 5 (1.651 m)   Wt 169 lb 1.6 oz (76.7 kg)   SpO2 97%   BMI 28.14 kg/m        Wt Readings from Last 3 Encounters:  11/02/24 169 lb 1.6 oz (76.7 kg)  09/30/24 167 lb (75.8 kg)  09/09/24 165 lb (74.8 kg)    GEN: Well nourished, well developed in no acute distress NECK: No JVD; No carotid bruits CARDIAC: RRR, no murmurs, rubs, gallops RESPIRATORY:  Clear to auscultation without rales, wheezing or rhonchi  ABDOMEN: Soft, non-tender, non-distended EXTREMITIES:  No edema; No deformity   ASSESSMENT AND PLAN  PAF / Hypercoagulable state / On Amiodarone  therapy - reports no  recent palpitations. RRR by auscultation. Continue Amiodarone  200mg  daily, Elqiuis 5mg  BID. CHA2DS2-VASc Score = 5 [CHF History: 0, HTN History: 1, Diabetes History: 1, Stroke History: 0, Vascular Disease History: 1, Age Score: 2, Gender Score: 0].  Therefore, the patient's annual risk of stroke is 7.2 %.    Amiodarone  monitoring: normal ALT, AST, TSH 07/2024. Monitor q51mos.   S/p PPM - Follows with Dr. Waddell.   Coronary artery calcification on CT / HLD, LDL goal <70 - CT 2022 with calcium score 353. Stable with no anginal symptoms. No indication for ischemic evaluation.  No ASA due to OAC. Continue Simvastatin  40mg  daily 12/13/23 LDL 95. Not at goal <70, consider changes Simvastatin  to Rosuvastatin.   DM2 - Continue to follow with PCP.        Dispo: follow up in 1 year  Signed, Reche GORMAN Finder, NP

## 2024-11-02 NOTE — Patient Instructions (Addendum)
 Medication Instructions:  Continue your current medications *If you need a refill on your cardiac medications before your next appointment, please call your pharmacy*  Follow-Up: At [facility], you and your health needs are our priority.  As part of our continuing mission to provide you with exceptional heart care, our providers are all part of one team.  This team includes your primary Cardiologist (physician) and Advanced Practice Providers or APPs (Physician Assistants and Nurse Practitioners) who all work together to provide you with the care you need, when you need it.  Your next appointment:   1 year(s)  Provider:   Shelda Bruckner, MD, Rosaline Bane, NP, or Reche Finder, NP    We recommend signing up for the patient portal called MyChart.  Sign up information is provided on this After Visit Summary.  MyChart is used to connect with patients for Virtual Visits (Telemedicine).  Patients are able to view lab/test results, encounter notes, upcoming appointments, etc.  Non-urgent messages can be sent to your provider as well.   To learn more about what you can do with MyChart, go to [email or web address].   Other Instructions  Our goal is for your LDL (bad cholesterol)  to be less than 70. If it is not less than 70 on your labs in December, we may consider changing your cholesterol medication.

## 2024-11-12 NOTE — CV Procedure (Signed)
  Device system confirmed to be MRI conditional, with implant date > 6 weeks ago, and no evidence of abandoned or epicardial leads in review of most recent CXR  Device last cleared by EP Provider: Charlies Arthur 11/12/24  Clearance is good through for 1 year as long as parameters remain stable at time of check. If pt undergoes a cardiac device procedure during that time, they should be re-cleared.   Tachy-therapies to be programmed off if applicable with device back to pre-MRI settings after completion of exam.  [employer] was available remotely to assist in programming recommendations.   Rocky Catalan, RT  11/12/2024 7:18 PM

## 2024-11-17 ENCOUNTER — Ambulatory Visit (HOSPITAL_COMMUNITY)
Admission: RE | Admit: 2024-11-17 | Discharge: 2024-11-17 | Disposition: A | Discharge status: Home / Self Care | Source: Ambulatory Visit | Attending: Physician Assistant | Admitting: Physician Assistant

## 2024-11-17 DIAGNOSIS — K5730 Diverticulosis of large intestine without perforation or abscess without bleeding: Secondary | ICD-10-CM | POA: Diagnosis not present

## 2024-11-17 DIAGNOSIS — M25552 Pain in left hip: Secondary | ICD-10-CM | POA: Insufficient documentation

## 2024-11-17 DIAGNOSIS — M25551 Pain in right hip: Secondary | ICD-10-CM | POA: Insufficient documentation

## 2024-11-17 DIAGNOSIS — M51369 Other intervertebral disc degeneration, lumbar region without mention of lumbar back pain or lower extremity pain: Secondary | ICD-10-CM | POA: Diagnosis not present

## 2024-11-17 DIAGNOSIS — N400 Enlarged prostate without lower urinary tract symptoms: Secondary | ICD-10-CM | POA: Diagnosis not present

## 2024-11-17 DIAGNOSIS — M160 Bilateral primary osteoarthritis of hip: Secondary | ICD-10-CM | POA: Diagnosis not present

## 2024-11-17 NOTE — Progress Notes (Signed)
 Patient was monitored by this RN during MRI scan due to presence of a pacemaker. Cardiac rhythm was continuously monitored throughout the procedure. Prior to the start of the scan, the pacemaker was placed in MRI-safe mode by the MRI technician and/or pacemaker representative. Following the completion of the scan, the device was returned to its pre-MRI settings. Neurological status and orientation post-procedure were unchanged from baseline.   Pre-procedure Heart Rate (Prior to being placed in MRI safe mode):70  Set to : 85  Post-procedure Heart Rate (Once pacemaker is returned to baseline mode):  69

## 2024-11-23 ENCOUNTER — Other Ambulatory Visit (HOSPITAL_BASED_OUTPATIENT_CLINIC_OR_DEPARTMENT_OTHER): Payer: Self-pay | Admitting: Cardiology

## 2024-11-23 DIAGNOSIS — Z7901 Long term (current) use of anticoagulants: Secondary | ICD-10-CM

## 2024-11-23 DIAGNOSIS — I4819 Other persistent atrial fibrillation: Secondary | ICD-10-CM

## 2024-11-24 NOTE — Telephone Encounter (Signed)
 Prescription refill request for Eliquis  received. Indication:afib Last office visit:11/25 Scr: 0.82  8/25 Age:83 Weight:76.7  kg  Prescription refilled

## 2024-12-07 DIAGNOSIS — M51362 Other intervertebral disc degeneration, lumbar region with discogenic back pain and lower extremity pain: Secondary | ICD-10-CM | POA: Diagnosis not present

## 2025-01-08 ENCOUNTER — Ambulatory Visit

## 2025-01-08 ENCOUNTER — Telehealth (HOSPITAL_BASED_OUTPATIENT_CLINIC_OR_DEPARTMENT_OTHER): Payer: Self-pay

## 2025-01-08 DIAGNOSIS — I4819 Other persistent atrial fibrillation: Secondary | ICD-10-CM

## 2025-01-08 NOTE — Telephone Encounter (Signed)
"  ° °  Pre-operative Risk Assessment    Patient Name: Benjamin Fuller  DOB: 20-Dec-1941 MRN: [id number]   Date of last office visit: 11/02/2024 with Reche Finder, NP Date of next office visit: None   Request for Surgical Clearance    Procedure:  Bilateral L4-L5 transforaminal epidural steroid injection  Date of Surgery:  Clearance TBD                                 Surgeon:  Dr. Laqueta Socks Group or Practice Name:  [facility] Phone number:  [phone]  Fax number:  [phone]   Type of Clearance Requested:   - Medical  - Pharmacy:  Hold Apixaban  (Eliquis ) 3 days   Type of Anesthesia:  does not specify   Additional requests/questions:  None  [name]   01/08/2025, 8:38 AM   "

## 2025-01-08 NOTE — Telephone Encounter (Signed)
 Pt calling to follow up and would like a c/b, please advise.

## 2025-01-08 NOTE — Telephone Encounter (Signed)
 I s/w the pt and he was wanting to make sure that we did get the clearance request from  Dr. Laqueta office. I assured the pt that we got it today and it is being reviewed right now by the pharmacist for a safe hold time. I assured the pt that hopefully we will have an answer Monday morning 01/11/25. Pt thanked me for the call and the help. He said the  procedure will not be scheduled until her has been cleared.

## 2025-01-10 LAB — CUP PACEART REMOTE DEVICE CHECK
Battery Remaining Longevity: 114 mo
Battery Remaining Percentage: 100 %
Brady Statistic RA Percent Paced: 4 %
Brady Statistic RV Percent Paced: 100 %
Date Time Interrogation Session: 20260109024100
Implantable Lead Connection Status: 753985
Implantable Lead Connection Status: 753985
Implantable Lead Implant Date: 20221222
Implantable Lead Implant Date: 20221222
Implantable Lead Location: 753859
Implantable Lead Location: 753860
Implantable Lead Model: 7841
Implantable Lead Model: 7842
Implantable Lead Serial Number: 1102393
Implantable Lead Serial Number: 1161013
Implantable Pulse Generator Implant Date: 20221222
Lead Channel Impedance Value: 610 Ohm
Lead Channel Impedance Value: 663 Ohm
Lead Channel Pacing Threshold Amplitude: 2.1 V
Lead Channel Pacing Threshold Pulse Width: 0.4 ms
Lead Channel Setting Pacing Amplitude: 2.5 V
Lead Channel Setting Pacing Amplitude: 2.5 V
Lead Channel Setting Pacing Pulse Width: 0.4 ms
Lead Channel Setting Sensing Sensitivity: 2.5 mV
Pulse Gen Serial Number: 562911
Zone Setting Status: 755011

## 2025-01-11 NOTE — Telephone Encounter (Signed)
 PT HAS A BSX PPM

## 2025-01-11 NOTE — Telephone Encounter (Signed)
 Please comment on BSX PPM for surgeon, Dr. Laqueta, Fx Number [phone]

## 2025-01-11 NOTE — Telephone Encounter (Signed)
" ° °  Name: Benjamin Fuller  DOB: 02-21-1941  MRN: [id number]  Primary Cardiologist: Shelda Bruckner, MD   Preoperative team, please contact this patient and set up a phone call appointment for further preoperative risk assessment. Please obtain consent and complete medication review. Thank you for your help.  I confirm that guidance regarding antiplatelet and oral anticoagulation therapy has been completed and, if necessary, noted below.  Per Pharmacy: 01/11/2025 Per office protocol, patient can hold Eliquis  for 3 days prior to procedure.      I also confirmed the patient resides in the state of [place] . As per [facility] Medical Board telemedicine laws, the patient must reside in the state in which the provider is licensed.   Lamarr Satterfield, NP 01/11/2025, 11:19 AM [facility]    "

## 2025-01-11 NOTE — Progress Notes (Signed)
 Remote PPM Transmission

## 2025-01-11 NOTE — Telephone Encounter (Signed)
 Patient scheduled for pre-op clearance on 01/14/25 with Lum Louis, NP.     Patient Consent for Virtual Visit        Benjamin Fuller has provided verbal consent on 01/11/2025 for a virtual visit (video or telephone).   CONSENT FOR VIRTUAL VISIT FOR:  Benjamin Fuller  By participating in this virtual visit I agree to the following:  I hereby voluntarily request, consent and authorize [facility] and its employed or contracted physicians, physician assistants, nurse practitioners or other licensed health care professionals (the Practitioner), to provide me with telemedicine health care services (the Services) as deemed necessary by the treating Practitioner. I acknowledge and consent to receive the Services by the Practitioner via telemedicine. I understand that the telemedicine visit will involve communicating with the Practitioner through live audiovisual communication technology and the disclosure of certain medical information by electronic transmission. I acknowledge that I have been given the opportunity to request an in-person assessment or other available alternative prior to the telemedicine visit and am voluntarily participating in the telemedicine visit.  I understand that I have the right to withhold or withdraw my consent to the use of telemedicine in the course of my care at any time, without affecting my right to future care or treatment, and that the Practitioner or I may terminate the telemedicine visit at any time. I understand that I have the right to inspect all information obtained and/or recorded in the course of the telemedicine visit and may receive copies of available information for a reasonable fee.  I understand that some of the potential risks of receiving the Services via telemedicine include:  Delay or interruption in medical evaluation due to technological equipment failure or disruption; Information transmitted may not be sufficient (e.g. poor  resolution of images) to allow for appropriate medical decision making by the Practitioner; and/or  In rare instances, security protocols could fail, causing a breach of personal health information.  Furthermore, I acknowledge that it is my responsibility to provide information about my medical history, conditions and care that is complete and accurate to the best of my ability. I acknowledge that Practitioner's advice, recommendations, and/or decision may be based on factors not within their control, such as incomplete or inaccurate data provided by me or distortions of diagnostic images or specimens that may result from electronic transmissions. I understand that the practice of medicine is not an exact science and that Practitioner makes no warranties or guarantees regarding treatment outcomes. I acknowledge that a copy of this consent can be made available to me via my patient portal [facility] MyChart), or I can request a printed copy by calling the office of [facility].    I understand that my insurance will be billed for this visit.   I have read or had this consent read to me. I understand the contents of this consent, which adequately explains the benefits and risks of the Services being provided via telemedicine.  I have been provided ample opportunity to ask questions regarding this consent and the Services and have had my questions answered to my satisfaction. I give my informed consent for the services to be provided through the use of telemedicine in my medical care

## 2025-01-11 NOTE — Telephone Encounter (Signed)
 Patient with diagnosis of afib on Eliquis  for anticoagulation.    Procedure: Bilateral L4-L5 transforaminal epidural steroid injection  Date of procedure: TBD   CHA2DS2-VASc Score = 5   This indicates a 7.2% annual risk of stroke. The patient's score is based upon: CHF History: 0 HTN History: 1 Diabetes History: 1 Stroke History: 0 Vascular Disease History: 1 Age Score: 2 Gender Score: 0      CrCl 74 ml/min Platelet count 173  Patient has not had an Afib/aflutter ablation in the last 3 months, DCCV within the last 4 weeks or a watchman implanted in the last 45 days   Per office protocol, patient can hold Eliquis  for 3 days prior to procedure.    **This guidance is not considered finalized until pre-operative APP has relayed final recommendations.**

## 2025-01-14 ENCOUNTER — Ambulatory Visit: Attending: Cardiology | Admitting: Emergency Medicine

## 2025-01-14 DIAGNOSIS — Z01810 Encounter for preprocedural cardiovascular examination: Secondary | ICD-10-CM | POA: Diagnosis not present
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
## 2025-01-15 ENCOUNTER — Ambulatory Visit: Payer: Self-pay | Admitting: Cardiovascular Disease

## 2025-04-09 ENCOUNTER — Encounter

## 2025-04-12 ENCOUNTER — Ambulatory Visit (HOSPITAL_COMMUNITY): Admitting: Internal Medicine

## 2025-07-09 ENCOUNTER — Encounter

## 2025-10-08 ENCOUNTER — Encounter
